# Patient Record
Sex: Female | Born: 1940 | Race: White | Hispanic: No | Marital: Married | Smoking: Never smoker
Health system: Southern US, Community
[De-identification: ages and names within clinical notes are randomized; demographics above are authoritative.]

## PROBLEM LIST (undated history)

## (undated) DIAGNOSIS — H409 Unspecified glaucoma: Secondary | ICD-10-CM

## (undated) DIAGNOSIS — M1990 Unspecified osteoarthritis, unspecified site: Secondary | ICD-10-CM

## (undated) DIAGNOSIS — G3184 Mild cognitive impairment, so stated: Secondary | ICD-10-CM

## (undated) DIAGNOSIS — I639 Cerebral infarction, unspecified: Secondary | ICD-10-CM

## (undated) DIAGNOSIS — K219 Gastro-esophageal reflux disease without esophagitis: Secondary | ICD-10-CM

## (undated) DIAGNOSIS — N390 Urinary tract infection, site not specified: Secondary | ICD-10-CM

## (undated) DIAGNOSIS — T8859XA Other complications of anesthesia, initial encounter: Secondary | ICD-10-CM

## (undated) DIAGNOSIS — I10 Essential (primary) hypertension: Secondary | ICD-10-CM

## (undated) DIAGNOSIS — E039 Hypothyroidism, unspecified: Secondary | ICD-10-CM

## (undated) DIAGNOSIS — D649 Anemia, unspecified: Secondary | ICD-10-CM

## (undated) DIAGNOSIS — F40240 Claustrophobia: Secondary | ICD-10-CM

## (undated) DIAGNOSIS — T4145XA Adverse effect of unspecified anesthetic, initial encounter: Secondary | ICD-10-CM

## (undated) HISTORY — PX: REFRACTIVE SURGERY: SHX103

## (undated) HISTORY — PX: HIP FRACTURE SURGERY: SHX118

## (undated) HISTORY — PX: RETINAL DETACHMENT SURGERY: SHX105

## (undated) HISTORY — PX: CATARACT EXTRACTION W/ INTRAOCULAR LENS  IMPLANT, BILATERAL: SHX1307

## (undated) HISTORY — PX: ABDOMINAL HYSTERECTOMY: SHX81

## (undated) HISTORY — PX: HIP ARTHROPLASTY: SHX981

## (undated) HISTORY — PX: HERNIA REPAIR: SHX51

## (undated) HISTORY — PX: TONSILLECTOMY: SUR1361

---

## 2001-04-07 ENCOUNTER — Ambulatory Visit (HOSPITAL_COMMUNITY): Admission: RE | Admit: 2001-04-07 | Discharge: 2001-04-07 | Payer: Self-pay | Admitting: Oncology

## 2010-11-12 DIAGNOSIS — K219 Gastro-esophageal reflux disease without esophagitis: Secondary | ICD-10-CM | POA: Diagnosis present

## 2010-11-12 DIAGNOSIS — M1990 Unspecified osteoarthritis, unspecified site: Secondary | ICD-10-CM | POA: Insufficient documentation

## 2010-11-12 DIAGNOSIS — E7800 Pure hypercholesterolemia, unspecified: Secondary | ICD-10-CM | POA: Diagnosis present

## 2011-02-21 DIAGNOSIS — R300 Dysuria: Secondary | ICD-10-CM | POA: Insufficient documentation

## 2011-02-21 DIAGNOSIS — R1084 Generalized abdominal pain: Secondary | ICD-10-CM | POA: Insufficient documentation

## 2011-05-17 DIAGNOSIS — M810 Age-related osteoporosis without current pathological fracture: Secondary | ICD-10-CM | POA: Insufficient documentation

## 2011-05-17 DIAGNOSIS — M5440 Lumbago with sciatica, unspecified side: Secondary | ICD-10-CM | POA: Insufficient documentation

## 2011-10-21 DIAGNOSIS — Z961 Presence of intraocular lens: Secondary | ICD-10-CM | POA: Diagnosis not present

## 2011-10-21 DIAGNOSIS — H40059 Ocular hypertension, unspecified eye: Secondary | ICD-10-CM | POA: Diagnosis not present

## 2011-10-21 DIAGNOSIS — H35039 Hypertensive retinopathy, unspecified eye: Secondary | ICD-10-CM | POA: Diagnosis not present

## 2011-10-21 DIAGNOSIS — H40019 Open angle with borderline findings, low risk, unspecified eye: Secondary | ICD-10-CM | POA: Diagnosis not present

## 2011-10-21 DIAGNOSIS — H43399 Other vitreous opacities, unspecified eye: Secondary | ICD-10-CM | POA: Diagnosis not present

## 2011-11-01 DIAGNOSIS — M25569 Pain in unspecified knee: Secondary | ICD-10-CM | POA: Diagnosis not present

## 2011-11-01 DIAGNOSIS — M25579 Pain in unspecified ankle and joints of unspecified foot: Secondary | ICD-10-CM | POA: Diagnosis not present

## 2011-11-04 DIAGNOSIS — M7989 Other specified soft tissue disorders: Secondary | ICD-10-CM | POA: Diagnosis not present

## 2011-11-04 DIAGNOSIS — M79609 Pain in unspecified limb: Secondary | ICD-10-CM | POA: Diagnosis not present

## 2011-11-18 DIAGNOSIS — E7800 Pure hypercholesterolemia, unspecified: Secondary | ICD-10-CM | POA: Diagnosis not present

## 2011-11-18 DIAGNOSIS — K2100 Gastro-esophageal reflux disease with esophagitis, without bleeding: Secondary | ICD-10-CM | POA: Diagnosis not present

## 2011-11-18 DIAGNOSIS — M5430 Sciatica, unspecified side: Secondary | ICD-10-CM | POA: Diagnosis not present

## 2011-11-18 DIAGNOSIS — M810 Age-related osteoporosis without current pathological fracture: Secondary | ICD-10-CM | POA: Diagnosis not present

## 2011-11-26 DIAGNOSIS — E7800 Pure hypercholesterolemia, unspecified: Secondary | ICD-10-CM | POA: Diagnosis not present

## 2011-11-26 DIAGNOSIS — K2100 Gastro-esophageal reflux disease with esophagitis, without bleeding: Secondary | ICD-10-CM | POA: Diagnosis not present

## 2011-11-26 DIAGNOSIS — R5381 Other malaise: Secondary | ICD-10-CM | POA: Diagnosis not present

## 2011-12-12 DIAGNOSIS — H33019 Retinal detachment with single break, unspecified eye: Secondary | ICD-10-CM | POA: Diagnosis not present

## 2012-02-20 DIAGNOSIS — H40019 Open angle with borderline findings, low risk, unspecified eye: Secondary | ICD-10-CM | POA: Diagnosis not present

## 2012-02-20 DIAGNOSIS — H43819 Vitreous degeneration, unspecified eye: Secondary | ICD-10-CM | POA: Diagnosis not present

## 2012-04-22 DIAGNOSIS — D1800 Hemangioma unspecified site: Secondary | ICD-10-CM | POA: Diagnosis not present

## 2012-04-22 DIAGNOSIS — L570 Actinic keratosis: Secondary | ICD-10-CM | POA: Diagnosis not present

## 2012-04-22 DIAGNOSIS — L819 Disorder of pigmentation, unspecified: Secondary | ICD-10-CM | POA: Diagnosis not present

## 2012-04-22 DIAGNOSIS — D485 Neoplasm of uncertain behavior of skin: Secondary | ICD-10-CM | POA: Diagnosis not present

## 2012-05-04 DIAGNOSIS — Z23 Encounter for immunization: Secondary | ICD-10-CM | POA: Diagnosis not present

## 2012-05-12 DIAGNOSIS — K2100 Gastro-esophageal reflux disease with esophagitis, without bleeding: Secondary | ICD-10-CM | POA: Diagnosis not present

## 2012-05-12 DIAGNOSIS — R5381 Other malaise: Secondary | ICD-10-CM | POA: Diagnosis not present

## 2012-05-12 DIAGNOSIS — E7800 Pure hypercholesterolemia, unspecified: Secondary | ICD-10-CM | POA: Diagnosis not present

## 2012-05-12 DIAGNOSIS — R7309 Other abnormal glucose: Secondary | ICD-10-CM | POA: Diagnosis not present

## 2012-05-18 DIAGNOSIS — E7800 Pure hypercholesterolemia, unspecified: Secondary | ICD-10-CM | POA: Diagnosis not present

## 2012-05-18 DIAGNOSIS — E039 Hypothyroidism, unspecified: Secondary | ICD-10-CM | POA: Diagnosis not present

## 2012-05-18 DIAGNOSIS — S9030XA Contusion of unspecified foot, initial encounter: Secondary | ICD-10-CM | POA: Diagnosis not present

## 2012-05-18 DIAGNOSIS — K2100 Gastro-esophageal reflux disease with esophagitis, without bleeding: Secondary | ICD-10-CM | POA: Diagnosis not present

## 2012-07-02 DIAGNOSIS — Z1231 Encounter for screening mammogram for malignant neoplasm of breast: Secondary | ICD-10-CM | POA: Diagnosis not present

## 2012-07-16 DIAGNOSIS — H40019 Open angle with borderline findings, low risk, unspecified eye: Secondary | ICD-10-CM | POA: Diagnosis not present

## 2012-07-16 DIAGNOSIS — H35039 Hypertensive retinopathy, unspecified eye: Secondary | ICD-10-CM | POA: Diagnosis not present

## 2012-07-16 DIAGNOSIS — H43399 Other vitreous opacities, unspecified eye: Secondary | ICD-10-CM | POA: Diagnosis not present

## 2012-07-16 DIAGNOSIS — Z961 Presence of intraocular lens: Secondary | ICD-10-CM | POA: Diagnosis not present

## 2012-11-12 DIAGNOSIS — K2100 Gastro-esophageal reflux disease with esophagitis, without bleeding: Secondary | ICD-10-CM | POA: Diagnosis not present

## 2012-11-12 DIAGNOSIS — E7800 Pure hypercholesterolemia, unspecified: Secondary | ICD-10-CM | POA: Diagnosis not present

## 2012-11-12 DIAGNOSIS — E039 Hypothyroidism, unspecified: Secondary | ICD-10-CM | POA: Diagnosis not present

## 2012-11-18 DIAGNOSIS — E039 Hypothyroidism, unspecified: Secondary | ICD-10-CM | POA: Diagnosis not present

## 2012-11-18 DIAGNOSIS — K2100 Gastro-esophageal reflux disease with esophagitis, without bleeding: Secondary | ICD-10-CM | POA: Diagnosis not present

## 2012-11-18 DIAGNOSIS — E7800 Pure hypercholesterolemia, unspecified: Secondary | ICD-10-CM | POA: Diagnosis not present

## 2012-11-18 DIAGNOSIS — N951 Menopausal and female climacteric states: Secondary | ICD-10-CM | POA: Insufficient documentation

## 2013-02-11 DIAGNOSIS — H40019 Open angle with borderline findings, low risk, unspecified eye: Secondary | ICD-10-CM | POA: Diagnosis not present

## 2013-04-30 DIAGNOSIS — J0100 Acute maxillary sinusitis, unspecified: Secondary | ICD-10-CM | POA: Insufficient documentation

## 2013-04-30 DIAGNOSIS — A491 Streptococcal infection, unspecified site: Secondary | ICD-10-CM | POA: Insufficient documentation

## 2013-05-10 DIAGNOSIS — R5381 Other malaise: Secondary | ICD-10-CM | POA: Insufficient documentation

## 2013-05-11 DIAGNOSIS — E7800 Pure hypercholesterolemia, unspecified: Secondary | ICD-10-CM | POA: Diagnosis not present

## 2013-05-11 DIAGNOSIS — K2100 Gastro-esophageal reflux disease with esophagitis, without bleeding: Secondary | ICD-10-CM | POA: Diagnosis not present

## 2013-05-11 DIAGNOSIS — Z23 Encounter for immunization: Secondary | ICD-10-CM | POA: Insufficient documentation

## 2013-05-11 DIAGNOSIS — R5381 Other malaise: Secondary | ICD-10-CM | POA: Diagnosis not present

## 2013-05-11 DIAGNOSIS — E039 Hypothyroidism, unspecified: Secondary | ICD-10-CM | POA: Diagnosis not present

## 2013-05-19 DIAGNOSIS — N951 Menopausal and female climacteric states: Secondary | ICD-10-CM | POA: Diagnosis not present

## 2013-05-19 DIAGNOSIS — R7309 Other abnormal glucose: Secondary | ICD-10-CM | POA: Diagnosis not present

## 2013-05-19 DIAGNOSIS — K2100 Gastro-esophageal reflux disease with esophagitis, without bleeding: Secondary | ICD-10-CM | POA: Diagnosis not present

## 2013-05-19 DIAGNOSIS — E7800 Pure hypercholesterolemia, unspecified: Secondary | ICD-10-CM | POA: Diagnosis not present

## 2013-05-19 DIAGNOSIS — E039 Hypothyroidism, unspecified: Secondary | ICD-10-CM | POA: Diagnosis not present

## 2013-07-05 DIAGNOSIS — Z1231 Encounter for screening mammogram for malignant neoplasm of breast: Secondary | ICD-10-CM | POA: Diagnosis not present

## 2013-08-05 DIAGNOSIS — H35039 Hypertensive retinopathy, unspecified eye: Secondary | ICD-10-CM | POA: Diagnosis not present

## 2013-08-05 DIAGNOSIS — H35379 Puckering of macula, unspecified eye: Secondary | ICD-10-CM | POA: Diagnosis not present

## 2013-08-05 DIAGNOSIS — H40019 Open angle with borderline findings, low risk, unspecified eye: Secondary | ICD-10-CM | POA: Diagnosis not present

## 2013-10-25 DIAGNOSIS — L819 Disorder of pigmentation, unspecified: Secondary | ICD-10-CM | POA: Diagnosis not present

## 2013-10-25 DIAGNOSIS — D1800 Hemangioma unspecified site: Secondary | ICD-10-CM | POA: Diagnosis not present

## 2013-11-10 DIAGNOSIS — R7309 Other abnormal glucose: Secondary | ICD-10-CM | POA: Diagnosis not present

## 2013-11-10 DIAGNOSIS — K2100 Gastro-esophageal reflux disease with esophagitis, without bleeding: Secondary | ICD-10-CM | POA: Diagnosis not present

## 2013-11-10 DIAGNOSIS — E039 Hypothyroidism, unspecified: Secondary | ICD-10-CM | POA: Diagnosis not present

## 2013-11-10 DIAGNOSIS — E782 Mixed hyperlipidemia: Secondary | ICD-10-CM | POA: Diagnosis not present

## 2013-11-10 DIAGNOSIS — E7800 Pure hypercholesterolemia, unspecified: Secondary | ICD-10-CM | POA: Diagnosis not present

## 2013-11-17 DIAGNOSIS — N951 Menopausal and female climacteric states: Secondary | ICD-10-CM | POA: Diagnosis not present

## 2013-11-17 DIAGNOSIS — E039 Hypothyroidism, unspecified: Secondary | ICD-10-CM | POA: Diagnosis not present

## 2013-11-17 DIAGNOSIS — R7309 Other abnormal glucose: Secondary | ICD-10-CM | POA: Diagnosis not present

## 2013-11-17 DIAGNOSIS — E7800 Pure hypercholesterolemia, unspecified: Secondary | ICD-10-CM | POA: Diagnosis not present

## 2013-11-17 DIAGNOSIS — K2100 Gastro-esophageal reflux disease with esophagitis, without bleeding: Secondary | ICD-10-CM | POA: Diagnosis not present

## 2013-12-13 DIAGNOSIS — R1011 Right upper quadrant pain: Secondary | ICD-10-CM | POA: Diagnosis not present

## 2013-12-17 DIAGNOSIS — R1011 Right upper quadrant pain: Secondary | ICD-10-CM | POA: Diagnosis not present

## 2014-03-22 DIAGNOSIS — H40029 Open angle with borderline findings, high risk, unspecified eye: Secondary | ICD-10-CM | POA: Diagnosis not present

## 2014-04-27 DIAGNOSIS — H401290 Low-tension glaucoma, unspecified eye, stage unspecified: Secondary | ICD-10-CM | POA: Diagnosis not present

## 2014-04-27 DIAGNOSIS — H409 Unspecified glaucoma: Secondary | ICD-10-CM | POA: Diagnosis not present

## 2014-04-27 DIAGNOSIS — H4720 Unspecified optic atrophy: Secondary | ICD-10-CM | POA: Diagnosis not present

## 2014-05-16 DIAGNOSIS — Z23 Encounter for immunization: Secondary | ICD-10-CM | POA: Diagnosis not present

## 2014-05-16 DIAGNOSIS — E780 Pure hypercholesterolemia: Secondary | ICD-10-CM | POA: Diagnosis not present

## 2014-05-16 DIAGNOSIS — K210 Gastro-esophageal reflux disease with esophagitis: Secondary | ICD-10-CM | POA: Diagnosis not present

## 2014-05-16 DIAGNOSIS — E039 Hypothyroidism, unspecified: Secondary | ICD-10-CM | POA: Diagnosis not present

## 2014-05-23 DIAGNOSIS — Z23 Encounter for immunization: Secondary | ICD-10-CM | POA: Diagnosis not present

## 2014-05-23 DIAGNOSIS — E782 Mixed hyperlipidemia: Secondary | ICD-10-CM | POA: Diagnosis not present

## 2014-05-23 DIAGNOSIS — K210 Gastro-esophageal reflux disease with esophagitis: Secondary | ICD-10-CM | POA: Diagnosis not present

## 2014-05-23 DIAGNOSIS — E039 Hypothyroidism, unspecified: Secondary | ICD-10-CM | POA: Diagnosis not present

## 2014-05-30 DIAGNOSIS — M25469 Effusion, unspecified knee: Secondary | ICD-10-CM | POA: Insufficient documentation

## 2014-05-31 DIAGNOSIS — M25461 Effusion, right knee: Secondary | ICD-10-CM | POA: Diagnosis not present

## 2014-05-31 DIAGNOSIS — M179 Osteoarthritis of knee, unspecified: Secondary | ICD-10-CM | POA: Diagnosis not present

## 2014-07-15 DIAGNOSIS — Z1231 Encounter for screening mammogram for malignant neoplasm of breast: Secondary | ICD-10-CM | POA: Diagnosis not present

## 2014-08-10 DIAGNOSIS — H47293 Other optic atrophy, bilateral: Secondary | ICD-10-CM | POA: Diagnosis not present

## 2014-08-10 DIAGNOSIS — H401233 Low-tension glaucoma, bilateral, severe stage: Secondary | ICD-10-CM | POA: Diagnosis not present

## 2014-08-10 DIAGNOSIS — H02825 Cysts of left lower eyelid: Secondary | ICD-10-CM | POA: Diagnosis not present

## 2014-10-18 DIAGNOSIS — D1800 Hemangioma unspecified site: Secondary | ICD-10-CM | POA: Diagnosis not present

## 2014-10-18 DIAGNOSIS — D485 Neoplasm of uncertain behavior of skin: Secondary | ICD-10-CM | POA: Diagnosis not present

## 2014-10-18 DIAGNOSIS — D225 Melanocytic nevi of trunk: Secondary | ICD-10-CM | POA: Diagnosis not present

## 2014-10-18 DIAGNOSIS — L570 Actinic keratosis: Secondary | ICD-10-CM | POA: Diagnosis not present

## 2014-10-25 DIAGNOSIS — E782 Mixed hyperlipidemia: Secondary | ICD-10-CM | POA: Diagnosis not present

## 2014-10-25 DIAGNOSIS — M179 Osteoarthritis of knee, unspecified: Secondary | ICD-10-CM | POA: Diagnosis not present

## 2014-10-25 DIAGNOSIS — R739 Hyperglycemia, unspecified: Secondary | ICD-10-CM | POA: Diagnosis not present

## 2014-10-25 DIAGNOSIS — E780 Pure hypercholesterolemia: Secondary | ICD-10-CM | POA: Diagnosis not present

## 2014-10-25 DIAGNOSIS — E039 Hypothyroidism, unspecified: Secondary | ICD-10-CM | POA: Diagnosis not present

## 2014-10-25 DIAGNOSIS — M1710 Unilateral primary osteoarthritis, unspecified knee: Secondary | ICD-10-CM | POA: Insufficient documentation

## 2014-10-25 DIAGNOSIS — K210 Gastro-esophageal reflux disease with esophagitis: Secondary | ICD-10-CM | POA: Diagnosis not present

## 2014-10-27 DIAGNOSIS — E780 Pure hypercholesterolemia: Secondary | ICD-10-CM | POA: Diagnosis not present

## 2014-10-27 DIAGNOSIS — E039 Hypothyroidism, unspecified: Secondary | ICD-10-CM | POA: Diagnosis not present

## 2014-10-27 DIAGNOSIS — E782 Mixed hyperlipidemia: Secondary | ICD-10-CM | POA: Diagnosis not present

## 2014-10-27 DIAGNOSIS — M179 Osteoarthritis of knee, unspecified: Secondary | ICD-10-CM | POA: Diagnosis not present

## 2014-10-27 DIAGNOSIS — K210 Gastro-esophageal reflux disease with esophagitis: Secondary | ICD-10-CM | POA: Diagnosis not present

## 2014-10-31 DIAGNOSIS — N951 Menopausal and female climacteric states: Secondary | ICD-10-CM | POA: Diagnosis not present

## 2014-10-31 DIAGNOSIS — E039 Hypothyroidism, unspecified: Secondary | ICD-10-CM | POA: Diagnosis not present

## 2014-10-31 DIAGNOSIS — E782 Mixed hyperlipidemia: Secondary | ICD-10-CM | POA: Diagnosis not present

## 2014-10-31 DIAGNOSIS — Z1389 Encounter for screening for other disorder: Secondary | ICD-10-CM | POA: Diagnosis not present

## 2014-10-31 DIAGNOSIS — K210 Gastro-esophageal reflux disease with esophagitis: Secondary | ICD-10-CM | POA: Diagnosis not present

## 2014-11-03 DIAGNOSIS — H43813 Vitreous degeneration, bilateral: Secondary | ICD-10-CM | POA: Diagnosis not present

## 2014-11-03 DIAGNOSIS — H35033 Hypertensive retinopathy, bilateral: Secondary | ICD-10-CM | POA: Diagnosis not present

## 2014-11-03 DIAGNOSIS — H401233 Low-tension glaucoma, bilateral, severe stage: Secondary | ICD-10-CM | POA: Diagnosis not present

## 2014-11-03 DIAGNOSIS — H35373 Puckering of macula, bilateral: Secondary | ICD-10-CM | POA: Diagnosis not present

## 2014-11-17 DIAGNOSIS — H401213 Low-tension glaucoma, right eye, severe stage: Secondary | ICD-10-CM | POA: Diagnosis not present

## 2014-12-01 DIAGNOSIS — H401223 Low-tension glaucoma, left eye, severe stage: Secondary | ICD-10-CM | POA: Diagnosis not present

## 2015-01-17 DIAGNOSIS — L720 Epidermal cyst: Secondary | ICD-10-CM | POA: Diagnosis not present

## 2015-03-27 DIAGNOSIS — H02825 Cysts of left lower eyelid: Secondary | ICD-10-CM | POA: Diagnosis not present

## 2015-03-27 DIAGNOSIS — H401233 Low-tension glaucoma, bilateral, severe stage: Secondary | ICD-10-CM | POA: Diagnosis not present

## 2015-03-27 DIAGNOSIS — H40053 Ocular hypertension, bilateral: Secondary | ICD-10-CM | POA: Diagnosis not present

## 2015-03-27 DIAGNOSIS — H47021 Hemorrhage in optic nerve sheath, right eye: Secondary | ICD-10-CM | POA: Diagnosis not present

## 2015-05-10 DIAGNOSIS — H401233 Low-tension glaucoma, bilateral, severe stage: Secondary | ICD-10-CM | POA: Diagnosis not present

## 2015-05-17 DIAGNOSIS — Z23 Encounter for immunization: Secondary | ICD-10-CM | POA: Insufficient documentation

## 2015-05-18 DIAGNOSIS — K210 Gastro-esophageal reflux disease with esophagitis: Secondary | ICD-10-CM | POA: Diagnosis not present

## 2015-05-18 DIAGNOSIS — R739 Hyperglycemia, unspecified: Secondary | ICD-10-CM | POA: Diagnosis not present

## 2015-05-18 DIAGNOSIS — E039 Hypothyroidism, unspecified: Secondary | ICD-10-CM | POA: Diagnosis not present

## 2015-05-18 DIAGNOSIS — Z13220 Encounter for screening for lipoid disorders: Secondary | ICD-10-CM | POA: Diagnosis not present

## 2015-05-18 DIAGNOSIS — Z23 Encounter for immunization: Secondary | ICD-10-CM | POA: Diagnosis not present

## 2015-05-18 DIAGNOSIS — E782 Mixed hyperlipidemia: Secondary | ICD-10-CM | POA: Diagnosis not present

## 2015-05-24 DIAGNOSIS — G3184 Mild cognitive impairment, so stated: Secondary | ICD-10-CM | POA: Insufficient documentation

## 2015-05-25 DIAGNOSIS — G3184 Mild cognitive impairment, so stated: Secondary | ICD-10-CM | POA: Diagnosis not present

## 2015-05-25 DIAGNOSIS — K210 Gastro-esophageal reflux disease with esophagitis: Secondary | ICD-10-CM | POA: Diagnosis not present

## 2015-05-25 DIAGNOSIS — E039 Hypothyroidism, unspecified: Secondary | ICD-10-CM | POA: Diagnosis not present

## 2015-05-25 DIAGNOSIS — N951 Menopausal and female climacteric states: Secondary | ICD-10-CM | POA: Diagnosis not present

## 2015-05-25 DIAGNOSIS — E782 Mixed hyperlipidemia: Secondary | ICD-10-CM | POA: Diagnosis not present

## 2015-07-18 DIAGNOSIS — Z1231 Encounter for screening mammogram for malignant neoplasm of breast: Secondary | ICD-10-CM | POA: Diagnosis not present

## 2015-07-25 DIAGNOSIS — H401233 Low-tension glaucoma, bilateral, severe stage: Secondary | ICD-10-CM | POA: Diagnosis not present

## 2015-08-28 DIAGNOSIS — J209 Acute bronchitis, unspecified: Secondary | ICD-10-CM | POA: Insufficient documentation

## 2015-08-29 DIAGNOSIS — J209 Acute bronchitis, unspecified: Secondary | ICD-10-CM | POA: Diagnosis not present

## 2015-09-12 ENCOUNTER — Encounter (INDEPENDENT_AMBULATORY_CARE_PROVIDER_SITE_OTHER): Payer: Self-pay | Admitting: *Deleted

## 2015-09-21 DIAGNOSIS — J0101 Acute recurrent maxillary sinusitis: Secondary | ICD-10-CM | POA: Diagnosis not present

## 2015-11-09 DIAGNOSIS — H47293 Other optic atrophy, bilateral: Secondary | ICD-10-CM | POA: Diagnosis not present

## 2015-11-09 DIAGNOSIS — H401213 Low-tension glaucoma, right eye, severe stage: Secondary | ICD-10-CM | POA: Diagnosis not present

## 2015-11-09 DIAGNOSIS — H43813 Vitreous degeneration, bilateral: Secondary | ICD-10-CM | POA: Diagnosis not present

## 2015-11-09 DIAGNOSIS — Z961 Presence of intraocular lens: Secondary | ICD-10-CM | POA: Diagnosis not present

## 2015-11-09 DIAGNOSIS — H35373 Puckering of macula, bilateral: Secondary | ICD-10-CM | POA: Diagnosis not present

## 2015-11-09 DIAGNOSIS — H35033 Hypertensive retinopathy, bilateral: Secondary | ICD-10-CM | POA: Diagnosis not present

## 2015-11-09 DIAGNOSIS — H401223 Low-tension glaucoma, left eye, severe stage: Secondary | ICD-10-CM | POA: Diagnosis not present

## 2015-11-16 DIAGNOSIS — E039 Hypothyroidism, unspecified: Secondary | ICD-10-CM | POA: Diagnosis not present

## 2015-11-16 DIAGNOSIS — R739 Hyperglycemia, unspecified: Secondary | ICD-10-CM | POA: Diagnosis not present

## 2015-11-16 DIAGNOSIS — E782 Mixed hyperlipidemia: Secondary | ICD-10-CM | POA: Diagnosis not present

## 2015-11-16 DIAGNOSIS — E7800 Pure hypercholesterolemia, unspecified: Secondary | ICD-10-CM | POA: Diagnosis not present

## 2015-11-16 DIAGNOSIS — K210 Gastro-esophageal reflux disease with esophagitis: Secondary | ICD-10-CM | POA: Diagnosis not present

## 2015-11-21 DIAGNOSIS — E782 Mixed hyperlipidemia: Secondary | ICD-10-CM | POA: Diagnosis not present

## 2015-11-21 DIAGNOSIS — E039 Hypothyroidism, unspecified: Secondary | ICD-10-CM | POA: Diagnosis not present

## 2015-11-21 DIAGNOSIS — R1110 Vomiting, unspecified: Secondary | ICD-10-CM | POA: Diagnosis not present

## 2015-11-21 DIAGNOSIS — K210 Gastro-esophageal reflux disease with esophagitis: Secondary | ICD-10-CM | POA: Diagnosis not present

## 2015-11-21 DIAGNOSIS — N951 Menopausal and female climacteric states: Secondary | ICD-10-CM | POA: Diagnosis not present

## 2015-11-21 DIAGNOSIS — G3184 Mild cognitive impairment, so stated: Secondary | ICD-10-CM | POA: Diagnosis not present

## 2015-11-21 DIAGNOSIS — T7840XA Allergy, unspecified, initial encounter: Secondary | ICD-10-CM | POA: Diagnosis not present

## 2016-02-27 DIAGNOSIS — H903 Sensorineural hearing loss, bilateral: Secondary | ICD-10-CM | POA: Diagnosis not present

## 2016-03-08 DIAGNOSIS — Z6823 Body mass index (BMI) 23.0-23.9, adult: Secondary | ICD-10-CM | POA: Diagnosis not present

## 2016-03-08 DIAGNOSIS — R300 Dysuria: Secondary | ICD-10-CM | POA: Diagnosis not present

## 2016-03-14 DIAGNOSIS — H401213 Low-tension glaucoma, right eye, severe stage: Secondary | ICD-10-CM | POA: Diagnosis not present

## 2016-03-14 DIAGNOSIS — H47293 Other optic atrophy, bilateral: Secondary | ICD-10-CM | POA: Diagnosis not present

## 2016-03-14 DIAGNOSIS — H47021 Hemorrhage in optic nerve sheath, right eye: Secondary | ICD-10-CM | POA: Diagnosis not present

## 2016-03-14 DIAGNOSIS — H401223 Low-tension glaucoma, left eye, severe stage: Secondary | ICD-10-CM | POA: Diagnosis not present

## 2016-03-25 ENCOUNTER — Other Ambulatory Visit: Payer: Self-pay

## 2016-04-17 DIAGNOSIS — H401131 Primary open-angle glaucoma, bilateral, mild stage: Secondary | ICD-10-CM | POA: Diagnosis not present

## 2016-04-18 DIAGNOSIS — Z23 Encounter for immunization: Secondary | ICD-10-CM | POA: Diagnosis not present

## 2016-04-22 DIAGNOSIS — Z1212 Encounter for screening for malignant neoplasm of rectum: Secondary | ICD-10-CM | POA: Diagnosis not present

## 2016-04-22 DIAGNOSIS — Z1211 Encounter for screening for malignant neoplasm of colon: Secondary | ICD-10-CM | POA: Diagnosis not present

## 2016-04-25 DIAGNOSIS — Z8669 Personal history of other diseases of the nervous system and sense organs: Secondary | ICD-10-CM | POA: Diagnosis not present

## 2016-04-25 DIAGNOSIS — H401322 Pigmentary glaucoma, left eye, moderate stage: Secondary | ICD-10-CM | POA: Diagnosis not present

## 2016-04-25 DIAGNOSIS — H401313 Pigmentary glaucoma, right eye, severe stage: Secondary | ICD-10-CM | POA: Diagnosis not present

## 2016-05-24 DIAGNOSIS — E039 Hypothyroidism, unspecified: Secondary | ICD-10-CM | POA: Diagnosis not present

## 2016-05-24 DIAGNOSIS — R739 Hyperglycemia, unspecified: Secondary | ICD-10-CM | POA: Diagnosis not present

## 2016-05-24 DIAGNOSIS — E782 Mixed hyperlipidemia: Secondary | ICD-10-CM | POA: Diagnosis not present

## 2016-05-24 DIAGNOSIS — K210 Gastro-esophageal reflux disease with esophagitis: Secondary | ICD-10-CM | POA: Diagnosis not present

## 2016-05-28 DIAGNOSIS — E782 Mixed hyperlipidemia: Secondary | ICD-10-CM | POA: Diagnosis not present

## 2016-05-28 DIAGNOSIS — I10 Essential (primary) hypertension: Secondary | ICD-10-CM | POA: Diagnosis not present

## 2016-05-28 DIAGNOSIS — Z6823 Body mass index (BMI) 23.0-23.9, adult: Secondary | ICD-10-CM | POA: Diagnosis not present

## 2016-05-28 DIAGNOSIS — G3184 Mild cognitive impairment, so stated: Secondary | ICD-10-CM | POA: Diagnosis not present

## 2016-05-28 DIAGNOSIS — K210 Gastro-esophageal reflux disease with esophagitis: Secondary | ICD-10-CM | POA: Diagnosis not present

## 2016-05-28 DIAGNOSIS — R42 Dizziness and giddiness: Secondary | ICD-10-CM | POA: Diagnosis not present

## 2016-05-28 DIAGNOSIS — N951 Menopausal and female climacteric states: Secondary | ICD-10-CM | POA: Diagnosis not present

## 2016-05-28 DIAGNOSIS — E039 Hypothyroidism, unspecified: Secondary | ICD-10-CM | POA: Diagnosis not present

## 2016-05-31 DIAGNOSIS — H401313 Pigmentary glaucoma, right eye, severe stage: Secondary | ICD-10-CM | POA: Diagnosis not present

## 2016-05-31 DIAGNOSIS — H401322 Pigmentary glaucoma, left eye, moderate stage: Secondary | ICD-10-CM | POA: Diagnosis not present

## 2016-06-05 DIAGNOSIS — N3001 Acute cystitis with hematuria: Secondary | ICD-10-CM | POA: Diagnosis not present

## 2016-06-05 DIAGNOSIS — Z6823 Body mass index (BMI) 23.0-23.9, adult: Secondary | ICD-10-CM | POA: Diagnosis not present

## 2016-06-05 DIAGNOSIS — R32 Unspecified urinary incontinence: Secondary | ICD-10-CM | POA: Diagnosis not present

## 2016-06-28 DIAGNOSIS — R32 Unspecified urinary incontinence: Secondary | ICD-10-CM | POA: Diagnosis not present

## 2016-06-28 DIAGNOSIS — Z6824 Body mass index (BMI) 24.0-24.9, adult: Secondary | ICD-10-CM | POA: Diagnosis not present

## 2016-06-28 DIAGNOSIS — N3001 Acute cystitis with hematuria: Secondary | ICD-10-CM | POA: Diagnosis not present

## 2016-07-18 DIAGNOSIS — Z1231 Encounter for screening mammogram for malignant neoplasm of breast: Secondary | ICD-10-CM | POA: Diagnosis not present

## 2016-08-05 DIAGNOSIS — H409 Unspecified glaucoma: Secondary | ICD-10-CM | POA: Diagnosis not present

## 2016-08-05 DIAGNOSIS — M25461 Effusion, right knee: Secondary | ICD-10-CM | POA: Diagnosis not present

## 2016-08-05 DIAGNOSIS — M1711 Unilateral primary osteoarthritis, right knee: Secondary | ICD-10-CM | POA: Diagnosis not present

## 2016-08-05 DIAGNOSIS — Z6824 Body mass index (BMI) 24.0-24.9, adult: Secondary | ICD-10-CM | POA: Diagnosis not present

## 2016-08-06 DIAGNOSIS — I10 Essential (primary) hypertension: Secondary | ICD-10-CM | POA: Diagnosis not present

## 2016-08-06 DIAGNOSIS — Z23 Encounter for immunization: Secondary | ICD-10-CM | POA: Diagnosis not present

## 2016-08-06 DIAGNOSIS — S199XXA Unspecified injury of neck, initial encounter: Secondary | ICD-10-CM | POA: Diagnosis not present

## 2016-08-06 DIAGNOSIS — W010XXA Fall on same level from slipping, tripping and stumbling without subsequent striking against object, initial encounter: Secondary | ICD-10-CM | POA: Diagnosis not present

## 2016-08-06 DIAGNOSIS — S0093XA Contusion of unspecified part of head, initial encounter: Secondary | ICD-10-CM | POA: Diagnosis not present

## 2016-08-06 DIAGNOSIS — S01512A Laceration without foreign body of oral cavity, initial encounter: Secondary | ICD-10-CM | POA: Diagnosis not present

## 2016-08-06 DIAGNOSIS — Z79899 Other long term (current) drug therapy: Secondary | ICD-10-CM | POA: Diagnosis not present

## 2016-08-06 DIAGNOSIS — Z7982 Long term (current) use of aspirin: Secondary | ICD-10-CM | POA: Diagnosis not present

## 2016-08-06 DIAGNOSIS — S022XXA Fracture of nasal bones, initial encounter for closed fracture: Secondary | ICD-10-CM | POA: Diagnosis not present

## 2016-08-29 DIAGNOSIS — G43109 Migraine with aura, not intractable, without status migrainosus: Secondary | ICD-10-CM | POA: Diagnosis not present

## 2016-08-29 DIAGNOSIS — H401313 Pigmentary glaucoma, right eye, severe stage: Secondary | ICD-10-CM | POA: Diagnosis not present

## 2016-09-10 DIAGNOSIS — G43109 Migraine with aura, not intractable, without status migrainosus: Secondary | ICD-10-CM | POA: Diagnosis not present

## 2016-09-10 DIAGNOSIS — G43909 Migraine, unspecified, not intractable, without status migrainosus: Secondary | ICD-10-CM | POA: Diagnosis not present

## 2016-09-10 DIAGNOSIS — G43119 Migraine with aura, intractable, without status migrainosus: Secondary | ICD-10-CM | POA: Diagnosis not present

## 2016-10-15 DIAGNOSIS — L570 Actinic keratosis: Secondary | ICD-10-CM | POA: Diagnosis not present

## 2016-10-15 DIAGNOSIS — D1800 Hemangioma unspecified site: Secondary | ICD-10-CM | POA: Diagnosis not present

## 2016-10-15 DIAGNOSIS — L821 Other seborrheic keratosis: Secondary | ICD-10-CM | POA: Diagnosis not present

## 2016-10-17 DIAGNOSIS — H401313 Pigmentary glaucoma, right eye, severe stage: Secondary | ICD-10-CM | POA: Diagnosis not present

## 2016-10-17 DIAGNOSIS — H401322 Pigmentary glaucoma, left eye, moderate stage: Secondary | ICD-10-CM | POA: Diagnosis not present

## 2016-10-30 DIAGNOSIS — M25461 Effusion, right knee: Secondary | ICD-10-CM | POA: Diagnosis not present

## 2016-10-30 DIAGNOSIS — Z6824 Body mass index (BMI) 24.0-24.9, adult: Secondary | ICD-10-CM | POA: Diagnosis not present

## 2016-10-30 DIAGNOSIS — M1711 Unilateral primary osteoarthritis, right knee: Secondary | ICD-10-CM | POA: Diagnosis not present

## 2016-11-19 DIAGNOSIS — Z23 Encounter for immunization: Secondary | ICD-10-CM | POA: Diagnosis not present

## 2016-11-22 DIAGNOSIS — E039 Hypothyroidism, unspecified: Secondary | ICD-10-CM | POA: Diagnosis not present

## 2016-11-22 DIAGNOSIS — E782 Mixed hyperlipidemia: Secondary | ICD-10-CM | POA: Diagnosis not present

## 2016-11-22 DIAGNOSIS — I10 Essential (primary) hypertension: Secondary | ICD-10-CM | POA: Diagnosis not present

## 2016-11-22 DIAGNOSIS — M80012A Age-related osteoporosis with current pathological fracture, left shoulder, initial encounter for fracture: Secondary | ICD-10-CM | POA: Diagnosis not present

## 2016-11-27 DIAGNOSIS — Z0001 Encounter for general adult medical examination with abnormal findings: Secondary | ICD-10-CM | POA: Diagnosis not present

## 2016-11-27 DIAGNOSIS — E782 Mixed hyperlipidemia: Secondary | ICD-10-CM | POA: Diagnosis not present

## 2016-11-27 DIAGNOSIS — G3184 Mild cognitive impairment, so stated: Secondary | ICD-10-CM | POA: Diagnosis not present

## 2016-11-27 DIAGNOSIS — R42 Dizziness and giddiness: Secondary | ICD-10-CM | POA: Diagnosis not present

## 2016-11-27 DIAGNOSIS — E039 Hypothyroidism, unspecified: Secondary | ICD-10-CM | POA: Diagnosis not present

## 2016-11-27 DIAGNOSIS — K210 Gastro-esophageal reflux disease with esophagitis: Secondary | ICD-10-CM | POA: Diagnosis not present

## 2016-11-27 DIAGNOSIS — I10 Essential (primary) hypertension: Secondary | ICD-10-CM | POA: Diagnosis not present

## 2016-11-27 DIAGNOSIS — N951 Menopausal and female climacteric states: Secondary | ICD-10-CM | POA: Diagnosis not present

## 2016-11-27 DIAGNOSIS — Z6825 Body mass index (BMI) 25.0-25.9, adult: Secondary | ICD-10-CM | POA: Diagnosis not present

## 2016-12-04 DIAGNOSIS — M25461 Effusion, right knee: Secondary | ICD-10-CM | POA: Diagnosis not present

## 2016-12-04 DIAGNOSIS — M1711 Unilateral primary osteoarthritis, right knee: Secondary | ICD-10-CM | POA: Diagnosis not present

## 2016-12-12 DIAGNOSIS — M1711 Unilateral primary osteoarthritis, right knee: Secondary | ICD-10-CM | POA: Diagnosis not present

## 2016-12-18 DIAGNOSIS — M25461 Effusion, right knee: Secondary | ICD-10-CM | POA: Diagnosis not present

## 2016-12-18 DIAGNOSIS — M1711 Unilateral primary osteoarthritis, right knee: Secondary | ICD-10-CM | POA: Diagnosis not present

## 2016-12-18 DIAGNOSIS — Z6824 Body mass index (BMI) 24.0-24.9, adult: Secondary | ICD-10-CM | POA: Diagnosis not present

## 2017-01-22 DIAGNOSIS — Z6825 Body mass index (BMI) 25.0-25.9, adult: Secondary | ICD-10-CM | POA: Diagnosis not present

## 2017-01-22 DIAGNOSIS — M19041 Primary osteoarthritis, right hand: Secondary | ICD-10-CM | POA: Diagnosis not present

## 2017-01-22 DIAGNOSIS — M1811 Unilateral primary osteoarthritis of first carpometacarpal joint, right hand: Secondary | ICD-10-CM | POA: Diagnosis not present

## 2017-01-22 DIAGNOSIS — M1711 Unilateral primary osteoarthritis, right knee: Secondary | ICD-10-CM | POA: Diagnosis not present

## 2017-01-22 DIAGNOSIS — M1812 Unilateral primary osteoarthritis of first carpometacarpal joint, left hand: Secondary | ICD-10-CM | POA: Diagnosis not present

## 2017-01-22 DIAGNOSIS — M19042 Primary osteoarthritis, left hand: Secondary | ICD-10-CM | POA: Diagnosis not present

## 2017-01-23 DIAGNOSIS — H401322 Pigmentary glaucoma, left eye, moderate stage: Secondary | ICD-10-CM | POA: Diagnosis not present

## 2017-01-23 DIAGNOSIS — H401313 Pigmentary glaucoma, right eye, severe stage: Secondary | ICD-10-CM | POA: Diagnosis not present

## 2017-01-30 DIAGNOSIS — N3001 Acute cystitis with hematuria: Secondary | ICD-10-CM | POA: Diagnosis not present

## 2017-01-30 DIAGNOSIS — Z6825 Body mass index (BMI) 25.0-25.9, adult: Secondary | ICD-10-CM | POA: Diagnosis not present

## 2017-02-08 DIAGNOSIS — Z6825 Body mass index (BMI) 25.0-25.9, adult: Secondary | ICD-10-CM | POA: Diagnosis not present

## 2017-02-08 DIAGNOSIS — N3001 Acute cystitis with hematuria: Secondary | ICD-10-CM | POA: Diagnosis not present

## 2017-02-24 DIAGNOSIS — R197 Diarrhea, unspecified: Secondary | ICD-10-CM | POA: Diagnosis not present

## 2017-02-24 DIAGNOSIS — Z6825 Body mass index (BMI) 25.0-25.9, adult: Secondary | ICD-10-CM | POA: Diagnosis not present

## 2017-03-18 DIAGNOSIS — Z23 Encounter for immunization: Secondary | ICD-10-CM | POA: Diagnosis not present

## 2017-04-08 DIAGNOSIS — H401122 Primary open-angle glaucoma, left eye, moderate stage: Secondary | ICD-10-CM | POA: Diagnosis not present

## 2017-04-08 DIAGNOSIS — H401113 Primary open-angle glaucoma, right eye, severe stage: Secondary | ICD-10-CM | POA: Diagnosis not present

## 2017-04-18 DIAGNOSIS — N3000 Acute cystitis without hematuria: Secondary | ICD-10-CM | POA: Diagnosis not present

## 2017-04-23 ENCOUNTER — Ambulatory Visit (INDEPENDENT_AMBULATORY_CARE_PROVIDER_SITE_OTHER): Payer: Medicare Other | Admitting: Urology

## 2017-04-23 DIAGNOSIS — N3021 Other chronic cystitis with hematuria: Secondary | ICD-10-CM | POA: Diagnosis not present

## 2017-05-12 DIAGNOSIS — H903 Sensorineural hearing loss, bilateral: Secondary | ICD-10-CM | POA: Diagnosis not present

## 2017-05-16 DIAGNOSIS — Z8669 Personal history of other diseases of the nervous system and sense organs: Secondary | ICD-10-CM | POA: Insufficient documentation

## 2017-05-16 DIAGNOSIS — H401322 Pigmentary glaucoma, left eye, moderate stage: Secondary | ICD-10-CM | POA: Diagnosis not present

## 2017-05-16 DIAGNOSIS — G43109 Migraine with aura, not intractable, without status migrainosus: Secondary | ICD-10-CM | POA: Diagnosis not present

## 2017-05-16 DIAGNOSIS — H401313 Pigmentary glaucoma, right eye, severe stage: Secondary | ICD-10-CM | POA: Insufficient documentation

## 2017-05-23 DIAGNOSIS — M25561 Pain in right knee: Secondary | ICD-10-CM | POA: Diagnosis not present

## 2017-05-23 DIAGNOSIS — M25562 Pain in left knee: Secondary | ICD-10-CM | POA: Diagnosis not present

## 2017-05-29 DIAGNOSIS — M948X6 Other specified disorders of cartilage, lower leg: Secondary | ICD-10-CM | POA: Diagnosis not present

## 2017-05-29 DIAGNOSIS — S83241A Other tear of medial meniscus, current injury, right knee, initial encounter: Secondary | ICD-10-CM | POA: Diagnosis not present

## 2017-05-29 DIAGNOSIS — S83231A Complex tear of medial meniscus, current injury, right knee, initial encounter: Secondary | ICD-10-CM | POA: Diagnosis not present

## 2017-05-29 DIAGNOSIS — S83281A Other tear of lateral meniscus, current injury, right knee, initial encounter: Secondary | ICD-10-CM | POA: Diagnosis not present

## 2017-05-30 DIAGNOSIS — K210 Gastro-esophageal reflux disease with esophagitis: Secondary | ICD-10-CM | POA: Diagnosis not present

## 2017-05-30 DIAGNOSIS — I10 Essential (primary) hypertension: Secondary | ICD-10-CM | POA: Diagnosis not present

## 2017-05-30 DIAGNOSIS — E782 Mixed hyperlipidemia: Secondary | ICD-10-CM | POA: Diagnosis not present

## 2017-05-30 DIAGNOSIS — R739 Hyperglycemia, unspecified: Secondary | ICD-10-CM | POA: Diagnosis not present

## 2017-05-30 DIAGNOSIS — M25561 Pain in right knee: Secondary | ICD-10-CM | POA: Diagnosis not present

## 2017-05-30 DIAGNOSIS — E039 Hypothyroidism, unspecified: Secondary | ICD-10-CM | POA: Diagnosis not present

## 2017-05-30 DIAGNOSIS — E7800 Pure hypercholesterolemia, unspecified: Secondary | ICD-10-CM | POA: Diagnosis not present

## 2017-05-30 DIAGNOSIS — G3184 Mild cognitive impairment, so stated: Secondary | ICD-10-CM | POA: Diagnosis not present

## 2017-06-04 DIAGNOSIS — E782 Mixed hyperlipidemia: Secondary | ICD-10-CM | POA: Diagnosis not present

## 2017-06-04 DIAGNOSIS — Z23 Encounter for immunization: Secondary | ICD-10-CM | POA: Diagnosis not present

## 2017-06-04 DIAGNOSIS — R7301 Impaired fasting glucose: Secondary | ICD-10-CM | POA: Diagnosis not present

## 2017-06-04 DIAGNOSIS — E039 Hypothyroidism, unspecified: Secondary | ICD-10-CM | POA: Diagnosis not present

## 2017-06-04 DIAGNOSIS — K210 Gastro-esophageal reflux disease with esophagitis: Secondary | ICD-10-CM | POA: Diagnosis not present

## 2017-06-04 DIAGNOSIS — N951 Menopausal and female climacteric states: Secondary | ICD-10-CM | POA: Diagnosis not present

## 2017-06-04 DIAGNOSIS — I10 Essential (primary) hypertension: Secondary | ICD-10-CM | POA: Diagnosis not present

## 2017-06-04 DIAGNOSIS — G3184 Mild cognitive impairment, so stated: Secondary | ICD-10-CM | POA: Diagnosis not present

## 2017-06-11 ENCOUNTER — Ambulatory Visit (INDEPENDENT_AMBULATORY_CARE_PROVIDER_SITE_OTHER): Payer: Medicare Other | Admitting: Urology

## 2017-06-11 DIAGNOSIS — N3021 Other chronic cystitis with hematuria: Secondary | ICD-10-CM

## 2017-07-08 DIAGNOSIS — H9201 Otalgia, right ear: Secondary | ICD-10-CM | POA: Insufficient documentation

## 2017-07-08 DIAGNOSIS — H903 Sensorineural hearing loss, bilateral: Secondary | ICD-10-CM | POA: Diagnosis not present

## 2017-07-24 DIAGNOSIS — Z1231 Encounter for screening mammogram for malignant neoplasm of breast: Secondary | ICD-10-CM | POA: Diagnosis not present

## 2017-08-29 DIAGNOSIS — M25561 Pain in right knee: Secondary | ICD-10-CM | POA: Diagnosis not present

## 2017-08-29 DIAGNOSIS — M25562 Pain in left knee: Secondary | ICD-10-CM | POA: Diagnosis not present

## 2017-09-10 ENCOUNTER — Other Ambulatory Visit (HOSPITAL_COMMUNITY)
Admission: AD | Admit: 2017-09-10 | Discharge: 2017-09-10 | Disposition: A | Discharge status: Home / Self Care | Payer: Medicare Other | Source: Other Acute Inpatient Hospital | Attending: Urology | Admitting: Urology

## 2017-09-10 ENCOUNTER — Ambulatory Visit (INDEPENDENT_AMBULATORY_CARE_PROVIDER_SITE_OTHER): Payer: Medicare Other | Admitting: Urology

## 2017-09-10 DIAGNOSIS — R300 Dysuria: Secondary | ICD-10-CM | POA: Diagnosis not present

## 2017-09-10 DIAGNOSIS — N3021 Other chronic cystitis with hematuria: Secondary | ICD-10-CM

## 2017-09-11 LAB — URINE CULTURE

## 2017-09-15 DIAGNOSIS — E039 Hypothyroidism, unspecified: Secondary | ICD-10-CM | POA: Diagnosis not present

## 2017-09-15 DIAGNOSIS — M179 Osteoarthritis of knee, unspecified: Secondary | ICD-10-CM | POA: Diagnosis not present

## 2017-09-15 DIAGNOSIS — Z6825 Body mass index (BMI) 25.0-25.9, adult: Secondary | ICD-10-CM | POA: Diagnosis not present

## 2017-09-15 DIAGNOSIS — I10 Essential (primary) hypertension: Secondary | ICD-10-CM | POA: Diagnosis not present

## 2017-09-15 DIAGNOSIS — E782 Mixed hyperlipidemia: Secondary | ICD-10-CM | POA: Diagnosis not present

## 2017-09-16 DIAGNOSIS — H401133 Primary open-angle glaucoma, bilateral, severe stage: Secondary | ICD-10-CM | POA: Diagnosis not present

## 2017-09-16 DIAGNOSIS — H401190 Primary open-angle glaucoma, unspecified eye, stage unspecified: Secondary | ICD-10-CM | POA: Insufficient documentation

## 2017-09-16 DIAGNOSIS — Z961 Presence of intraocular lens: Secondary | ICD-10-CM | POA: Diagnosis not present

## 2017-09-17 DIAGNOSIS — L0100 Impetigo, unspecified: Secondary | ICD-10-CM | POA: Diagnosis not present

## 2017-09-23 DIAGNOSIS — M25562 Pain in left knee: Secondary | ICD-10-CM | POA: Diagnosis not present

## 2017-09-23 DIAGNOSIS — M25561 Pain in right knee: Secondary | ICD-10-CM | POA: Diagnosis not present

## 2017-09-24 ENCOUNTER — Ambulatory Visit: Payer: Self-pay | Admitting: Physician Assistant

## 2017-09-24 NOTE — H&P (Signed)
TOTAL KNEE ADMISSION H&P  Patient is being admitted for right total knee arthroplasty.  Subjective:  Chief Complaint:right knee pain.  HPI: Rebekah King, 77 y.o. female, has a history of pain and functional disability in the right knee due to arthritis and has failed non-surgical conservative treatments for greater than 12 weeks to includeNSAID's and/or analgesics, corticosteriod injections and activity modification.  Onset of symptoms was gradual, starting 7 years ago with gradually worsening course since that time. The patient noted no past surgery on the right knee(s).  Patient currently rates pain in the right knee(s) at 8 out of 10 with activity. Patient has night pain, worsening of pain with activity and weight bearing, pain that interferes with activities of daily living, pain with passive range of motion, crepitus and joint swelling.  Patient has evidence of periarticular osteophytes and joint space narrowing by imaging studies. There is no active infection.  There are no active problems to display for this patient.  No past medical history on file.  PMH:  Right knee OA High cholesterol GERD   No current outpatient medications on file.   No current facility-administered medications for this visit.    Allergies not on file  Social History   Tobacco Use  . Smoking status: Not on file  Substance Use Topics  . Alcohol use: Not on file    No family history on file.   Review of Systems  HENT: Positive for hearing loss and tinnitus.   Gastrointestinal: Positive for diarrhea.  Genitourinary: Positive for dysuria, frequency, hematuria and urgency.  Musculoskeletal: Positive for joint pain.    Objective:  Physical Exam  Constitutional: She is oriented to person, place, and time. She appears well-developed and well-nourished. No distress.  HENT:  Head: Normocephalic and atraumatic.  Nose: Nose normal.  Eyes: Conjunctivae and EOM are normal. Pupils are equal, round, and  reactive to light.  Neck: Normal range of motion. Neck supple.  Cardiovascular: Normal rate, regular rhythm, normal heart sounds and intact distal pulses.  Respiratory: Effort normal and breath sounds normal. No respiratory distress. She has no wheezes.  GI: Soft. Bowel sounds are normal. She exhibits no distension. There is no tenderness.  Musculoskeletal:       Right knee: She exhibits decreased range of motion, swelling and deformity. Tenderness found.  Lymphadenopathy:    She has no cervical adenopathy.  Neurological: She is alert and oriented to person, place, and time. No cranial nerve deficit.  Skin: Skin is warm and dry. No rash noted. No erythema.  Psychiatric: She has a normal mood and affect. Her behavior is normal.    Vital signs in last 24 hours: @VSRANGES @  Labs:   There is no height or weight on file to calculate BMI.   Imaging Review Plain radiographs demonstrate severe degenerative joint disease of the right knee(s). The overall alignment ismild valgus. The bone quality appears to be good for age and reported activity level.  Assessment/Plan:  End stage arthritis, right knee   The patient history, physical examination, clinical judgment of the provider and imaging studies are consistent with end stage degenerative joint disease of the right knee(s) and total knee arthroplasty is deemed medically necessary. The treatment options including medical management, injection therapy arthroscopy and arthroplasty were discussed at length. The risks and benefits of total knee arthroplasty were presented and reviewed. The risks due to aseptic loosening, infection, stiffness, patella tracking problems, thromboembolic complications and other imponderables were discussed. The patient acknowledged the explanation, agreed  to proceed with the plan and consent was signed. Patient is being admitted for inpatient treatment for surgery, pain control, PT, OT, prophylactic antibiotics, VTE  prophylaxis, progressive ambulation and ADL's and discharge planning. The patient is planning to be discharged home with home health services

## 2017-09-24 NOTE — H&P (View-Only) (Signed)
TOTAL KNEE ADMISSION H&P  Patient is being admitted for right total knee arthroplasty.  Subjective:  Chief Complaint:right knee pain.  HPI: Rebekah King, 77 y.o. female, has a history of pain and functional disability in the right knee due to arthritis and has failed non-surgical conservative treatments for greater than 12 weeks to includeNSAID's and/or analgesics, corticosteriod injections and activity modification.  Onset of symptoms was gradual, starting 7 years ago with gradually worsening course since that time. The patient noted no past surgery on the right knee(s).  Patient currently rates pain in the right knee(s) at 8 out of 10 with activity. Patient has night pain, worsening of pain with activity and weight bearing, pain that interferes with activities of daily living, pain with passive range of motion, crepitus and joint swelling.  Patient has evidence of periarticular osteophytes and joint space narrowing by imaging studies. There is no active infection.  There are no active problems to display for this patient.  No past medical history on file.  PMH:  Right knee OA High cholesterol GERD   No current outpatient medications on file.   No current facility-administered medications for this visit.    Allergies not on file  Social History   Tobacco Use  . Smoking status: Not on file  Substance Use Topics  . Alcohol use: Not on file    No family history on file.   Review of Systems  HENT: Positive for hearing loss and tinnitus.   Gastrointestinal: Positive for diarrhea.  Genitourinary: Positive for dysuria, frequency, hematuria and urgency.  Musculoskeletal: Positive for joint pain.    Objective:  Physical Exam  Constitutional: She is oriented to person, place, and time. She appears well-developed and well-nourished. No distress.  HENT:  Head: Normocephalic and atraumatic.  Nose: Nose normal.  Eyes: Conjunctivae and EOM are normal. Pupils are equal, round, and  reactive to light.  Neck: Normal range of motion. Neck supple.  Cardiovascular: Normal rate, regular rhythm, normal heart sounds and intact distal pulses.  Respiratory: Effort normal and breath sounds normal. No respiratory distress. She has no wheezes.  GI: Soft. Bowel sounds are normal. She exhibits no distension. There is no tenderness.  Musculoskeletal:       Right knee: She exhibits decreased range of motion, swelling and deformity. Tenderness found.  Lymphadenopathy:    She has no cervical adenopathy.  Neurological: She is alert and oriented to person, place, and time. No cranial nerve deficit.  Skin: Skin is warm and dry. No rash noted. No erythema.  Psychiatric: She has a normal mood and affect. Her behavior is normal.    Vital signs in last 24 hours: @VSRANGES @  Labs:   There is no height or weight on file to calculate BMI.   Imaging Review Plain radiographs demonstrate severe degenerative joint disease of the right knee(s). The overall alignment ismild valgus. The bone quality appears to be good for age and reported activity level.  Assessment/Plan:  End stage arthritis, right knee   The patient history, physical examination, clinical judgment of the provider and imaging studies are consistent with end stage degenerative joint disease of the right knee(s) and total knee arthroplasty is deemed medically necessary. The treatment options including medical management, injection therapy arthroscopy and arthroplasty were discussed at length. The risks and benefits of total knee arthroplasty were presented and reviewed. The risks due to aseptic loosening, infection, stiffness, patella tracking problems, thromboembolic complications and other imponderables were discussed. The patient acknowledged the explanation, agreed  to proceed with the plan and consent was signed. Patient is being admitted for inpatient treatment for surgery, pain control, PT, OT, prophylactic antibiotics, VTE  prophylaxis, progressive ambulation and ADL's and discharge planning. The patient is planning to be discharged home with home health services

## 2017-10-06 NOTE — Pre-Procedure Instructions (Signed)
Rebekah King  10/06/2017      [facility] - [address], [place], [place] - [place] [address] Phone: [phone] Fax: [phone]    Your procedure is scheduled on  Friday 10/17/17  Report to [place] at 530 A.M.  Call this number if you have problems the morning of surgery:  [phone]   Remember:  Do not eat food or drink liquids after midnight.  Take these medicines the morning of surgery with A SIP OF WATER  - EYE DROPS, ESTRADIOL (ESTRACE), LEVOTHYROXINE, RANITIDINE (ZANTAC)  7 days prior to surgery STOP taking any Aspirin(unless otherwise instructed by your surgeon), Aleve, Naproxen, Ibuprofen, Motrin, Advil, Goody's, BC's, all herbal medications, fish oil, and all vitamins   Do not wear jewelry, make-up or nail polish.  Do not wear lotions, powders, or perfumes, or deodorant.  Do not shave 48 hours prior to surgery.  Men may shave face and neck.  Do not bring valuables to the hospital.  [facility] is not responsible for any belongings or valuables.  Contacts, dentures or bridgework may not be worn into surgery.  Leave your suitcase in the car.  After surgery it may be brought to your room.  For patients admitted to the hospital, discharge time will be determined by your treatment team.  Patients discharged the day of surgery will not be allowed to drive home.   Name and phone number of your driver:    Special instructions:  [place] - Preparing for Surgery  Before surgery, you can play an important role.  Because skin is not sterile, your skin needs to be as free of germs as possible.  You can reduce the number of germs on you skin by washing with CHG (chlorahexidine gluconate) soap before surgery.  CHG is an antiseptic cleaner which kills germs and bonds with the skin to continue killing germs even after washing.  Please DO NOT use if you have an allergy to CHG or antibacterial soaps.  If your skin  becomes reddened/irritated stop using the CHG and inform your nurse when you arrive at Short Stay.  Do not shave (including legs and underarms) for at least 48 hours prior to the first CHG shower.  You may shave your face.  Please follow these instructions carefully:   1.  Shower with CHG Soap the night before surgery and the                                morning of Surgery.  2.  If you choose to wash your hair, wash your hair first as usual with your       normal shampoo.  3.  After you shampoo, rinse your hair and body thoroughly to remove the                      Shampoo.  4.  Use CHG as you would any other liquid soap.  You can apply chg directly       to the skin and wash gently with scrungie or a clean washcloth.  5.  Apply the CHG Soap to your body ONLY FROM THE NECK DOWN.        Do not use on open wounds or open sores.  Avoid contact with your eyes,       ears, mouth and genitals (private parts).  Wash genitals (private parts)       with your normal soap.  6.  Wash thoroughly, paying special attention to the area where your surgery        will be performed.  7.  Thoroughly rinse your body with warm water from the neck down.  8.  DO NOT shower/wash with your normal soap after using and rinsing off       the CHG Soap.  9.  Pat yourself dry with a clean towel.            10.  Wear clean pajamas.            11.  Place clean sheets on your bed the night of your first shower and do not        sleep with pets.  Day of Surgery  Do not apply any lotions/deoderants the morning of surgery.  Please wear clean clothes to the hospital/surgery center.    Please read over the following fact sheets that you were given. Pain Booklet, MRSA Information and Surgical Site Infection Prevention

## 2017-10-07 ENCOUNTER — Encounter (HOSPITAL_COMMUNITY)
Admission: RE | Admit: 2017-10-07 | Discharge: 2017-10-07 | Disposition: A | Discharge status: Home / Self Care | Payer: Medicare Other | Source: Ambulatory Visit | Attending: Orthopedic Surgery | Admitting: Orthopedic Surgery

## 2017-10-07 ENCOUNTER — Other Ambulatory Visit: Payer: Self-pay

## 2017-10-07 ENCOUNTER — Encounter (HOSPITAL_COMMUNITY): Payer: Self-pay | Admitting: *Deleted

## 2017-10-07 ENCOUNTER — Ambulatory Visit (HOSPITAL_COMMUNITY)
Admission: RE | Admit: 2017-10-07 | Discharge: 2017-10-07 | Disposition: A | Discharge status: Home / Self Care | Payer: Medicare Other | Source: Ambulatory Visit | Attending: Physician Assistant | Admitting: Physician Assistant

## 2017-10-07 DIAGNOSIS — R9431 Abnormal electrocardiogram [ECG] [EKG]: Secondary | ICD-10-CM | POA: Insufficient documentation

## 2017-10-07 DIAGNOSIS — M25561 Pain in right knee: Secondary | ICD-10-CM | POA: Diagnosis not present

## 2017-10-07 DIAGNOSIS — M1711 Unilateral primary osteoarthritis, right knee: Secondary | ICD-10-CM | POA: Insufficient documentation

## 2017-10-07 DIAGNOSIS — Z01812 Encounter for preprocedural laboratory examination: Secondary | ICD-10-CM | POA: Insufficient documentation

## 2017-10-07 DIAGNOSIS — Z01818 Encounter for other preprocedural examination: Secondary | ICD-10-CM | POA: Insufficient documentation

## 2017-10-07 DIAGNOSIS — Z01810 Encounter for preprocedural cardiovascular examination: Secondary | ICD-10-CM | POA: Diagnosis not present

## 2017-10-07 HISTORY — DX: Hypothyroidism, unspecified: E03.9

## 2017-10-07 HISTORY — DX: Other complications of anesthesia, initial encounter: T88.59XA

## 2017-10-07 HISTORY — DX: Adverse effect of unspecified anesthetic, initial encounter: T41.45XA

## 2017-10-07 HISTORY — DX: Gastro-esophageal reflux disease without esophagitis: K21.9

## 2017-10-07 HISTORY — DX: Essential (primary) hypertension: I10

## 2017-10-07 HISTORY — DX: Urinary tract infection, site not specified: N39.0

## 2017-10-07 HISTORY — DX: Anemia, unspecified: D64.9

## 2017-10-07 HISTORY — DX: Unspecified glaucoma: H40.9

## 2017-10-07 HISTORY — DX: Unspecified osteoarthritis, unspecified site: M19.90

## 2017-10-07 LAB — COMPREHENSIVE METABOLIC PANEL
ALK PHOS: 62 U/L (ref 38–126)
ALT: 22 U/L (ref 14–54)
AST: 27 U/L (ref 15–41)
Albumin: 4.2 g/dL (ref 3.5–5.0)
Anion gap: 9 (ref 5–15)
BUN: 12 mg/dL (ref 6–20)
CALCIUM: 9.1 mg/dL (ref 8.9–10.3)
CHLORIDE: 105 mmol/L (ref 101–111)
CO2: 24 mmol/L (ref 22–32)
CREATININE: 0.87 mg/dL (ref 0.44–1.00)
GFR calc Af Amer: >60 mL/min (ref >60)
GFR calc non Af Amer: >60 mL/min (ref >60)
Glucose, Bld: 102 mg/dL — ABNORMAL HIGH (ref 65–99)
Potassium: 3.7 mmol/L (ref 3.5–5.1)
SODIUM: 138 mmol/L (ref 135–145)
Total Bilirubin: 0.9 mg/dL (ref 0.3–1.2)
Total Protein: 6.7 g/dL (ref 6.5–8.1)

## 2017-10-07 LAB — CBC WITH DIFFERENTIAL/PLATELET
BASOS ABS: 0 10*3/uL (ref 0.0–0.1)
Basophils Relative: 0 %
EOS ABS: 0.1 10*3/uL (ref 0.0–0.7)
EOS PCT: 2 %
HCT: 35.6 % — ABNORMAL LOW (ref 36.0–46.0)
HEMOGLOBIN: 11.9 g/dL — AB (ref 12.0–15.0)
LYMPHS ABS: 2.1 10*3/uL (ref 0.7–4.0)
LYMPHS PCT: 34 %
MCH: 31.7 pg (ref 26.0–34.0)
MCHC: 33.4 g/dL (ref 30.0–36.0)
MCV: 94.9 fL (ref 78.0–100.0)
Monocytes Absolute: 0.3 10*3/uL (ref 0.1–1.0)
Monocytes Relative: 5 %
NEUTROS PCT: 59 %
Neutro Abs: 3.6 10*3/uL (ref 1.7–7.7)
PLATELETS: 219 10*3/uL (ref 150–400)
RBC: 3.75 MIL/uL — AB (ref 3.87–5.11)
RDW: 12.6 % (ref 11.5–15.5)
WBC: 6.2 10*3/uL (ref 4.0–10.5)

## 2017-10-07 LAB — URINALYSIS, ROUTINE W REFLEX MICROSCOPIC
Bacteria, UA: NONE SEEN
Bilirubin Urine: NEGATIVE
GLUCOSE, UA: NEGATIVE mg/dL
HGB URINE DIPSTICK: NEGATIVE
Ketones, ur: NEGATIVE mg/dL
NITRITE: NEGATIVE
PH: 6 (ref 5.0–8.0)
Protein, ur: NEGATIVE mg/dL
SPECIFIC GRAVITY, URINE: 1.006 (ref 1.005–1.030)

## 2017-10-07 LAB — ABO/RH: ABO/RH(D): A POS

## 2017-10-07 LAB — APTT: APTT: 27 s (ref 24–36)

## 2017-10-07 LAB — TYPE AND SCREEN
ABO/RH(D): A POS
Antibody Screen: NEGATIVE

## 2017-10-07 LAB — PROTIME-INR
INR: 0.98
Prothrombin Time: 12.9 seconds (ref 11.4–15.2)

## 2017-10-07 LAB — SURGICAL PCR SCREEN
MRSA, PCR: NEGATIVE
Staphylococcus aureus: NEGATIVE

## 2017-10-08 LAB — URINE CULTURE: CULTURE: NO GROWTH

## 2017-10-09 ENCOUNTER — Other Ambulatory Visit: Payer: Self-pay | Admitting: Orthopedic Surgery

## 2017-10-09 NOTE — Care Plan (Unsigned)
Spoke with patient prior to surgery. She is planning to discharge to home with her family and HHPT provided by [facility]. She has requested Verl Dicker, PT. Referral has been sent. She will transition to OPPT at [facility] on 10/29/17 @ 100. She is scheduled to follow up with Dr. [name] on 10/31/17 at 1150.   Please contact Ladell Heads, RN CM at the office should concerns arise or plans need to change. [phone]

## 2017-10-16 MED ORDER — CEFAZOLIN SODIUM-DEXTROSE 2-4 GM/100ML-% IV SOLN
2.0000 g | INTRAVENOUS | Status: AC
  Administered 2017-10-17: 2 g via INTRAVENOUS
  Filled 2017-10-16: qty 100

## 2017-10-16 MED ORDER — TRANEXAMIC ACID 1000 MG/10ML IV SOLN
2000.0000 mg | INTRAVENOUS | Status: AC
  Administered 2017-10-17: 2000 mg via TOPICAL
  Filled 2017-10-16: qty 20

## 2017-10-16 MED ORDER — SODIUM CHLORIDE 0.9 % IV SOLN: INTRAVENOUS | Status: DC

## 2017-10-16 MED ORDER — TRANEXAMIC ACID 1000 MG/10ML IV SOLN
1000.0000 mg | INTRAVENOUS | Status: AC
  Administered 2017-10-17: 1000 mg via INTRAVENOUS
  Filled 2017-10-16: qty 1100

## 2017-10-16 MED ORDER — BUPIVACAINE LIPOSOME 1.3 % IJ SUSP
20.0000 mL | INTRAMUSCULAR | Status: AC
  Administered 2017-10-17: 20 mL
  Filled 2017-10-16: qty 20

## 2017-10-16 NOTE — Anesthesia Preprocedure Evaluation (Addendum)
Anesthesia Evaluation  Patient identified by MRN, date of birth, ID band Patient awake    Reviewed: Allergy & Precautions, NPO status , Patient's Chart, lab work & pertinent test results  History of Anesthesia Complications Negative for: history of anesthetic complications  Airway Mallampati: II  TM Distance: >3 FB Neck ROM: Full    Dental  (+) Dental Advisory Given   Pulmonary neg pulmonary ROS,    breath sounds clear to auscultation       Cardiovascular hypertension (does not required BP meds),  Rhythm:Regular Rate:Normal     Neuro/Psych glaucoma    GI/Hepatic Neg liver ROS, GERD  Medicated and Controlled,  Endo/Other  Hypothyroidism   Renal/GU negative Renal ROS     Musculoskeletal  (+) Arthritis , Osteoarthritis,    Abdominal   Peds  Hematology negative hematology ROS (+)   Anesthesia Other Findings   Reproductive/Obstetrics                            Anesthesia Physical Anesthesia Plan  ASA: II  Anesthesia Plan: Spinal   Post-op Pain Management:  Regional for Post-op pain   Induction:   PONV Risk Score and Plan: 2 and Ondansetron and Dexamethasone  Airway Management Planned: Natural Airway and Nasal Cannula  Additional Equipment:   Intra-op Plan:   Post-operative Plan:   Informed Consent: I have reviewed the patients History and Physical, chart, labs and discussed the procedure including the risks, benefits and alternatives for the proposed anesthesia with the patient or authorized representative who has indicated his/her understanding and acceptance.   Dental advisory given  Plan Discussed with: CRNA and Surgeon  Anesthesia Plan Comments: (Plan routine monitors, SAB with adductor canal block for post op analgesia)        Anesthesia Quick Evaluation

## 2017-10-17 ENCOUNTER — Encounter (HOSPITAL_COMMUNITY): Payer: Self-pay | Admitting: *Deleted

## 2017-10-17 ENCOUNTER — Inpatient Hospital Stay (HOSPITAL_COMMUNITY): Payer: Medicare Other | Admitting: Anesthesiology

## 2017-10-17 ENCOUNTER — Other Ambulatory Visit: Payer: Self-pay

## 2017-10-17 ENCOUNTER — Observation Stay (HOSPITAL_COMMUNITY)
Admission: RE | Admit: 2017-10-17 | Discharge: 2017-10-18 | Disposition: A | Discharge status: Home / Self Care | Payer: Medicare Other | Source: Ambulatory Visit | Attending: Orthopedic Surgery | Admitting: Orthopedic Surgery

## 2017-10-17 ENCOUNTER — Encounter (HOSPITAL_COMMUNITY)
Admission: RE | Disposition: A | Discharge status: Home / Self Care | Payer: Self-pay | Source: Ambulatory Visit | Attending: Orthopedic Surgery

## 2017-10-17 DIAGNOSIS — E039 Hypothyroidism, unspecified: Secondary | ICD-10-CM | POA: Insufficient documentation

## 2017-10-17 DIAGNOSIS — D649 Anemia, unspecified: Secondary | ICD-10-CM | POA: Insufficient documentation

## 2017-10-17 DIAGNOSIS — M1711 Unilateral primary osteoarthritis, right knee: Principal | ICD-10-CM | POA: Insufficient documentation

## 2017-10-17 DIAGNOSIS — M21061 Valgus deformity, not elsewhere classified, right knee: Secondary | ICD-10-CM | POA: Insufficient documentation

## 2017-10-17 DIAGNOSIS — Z96652 Presence of left artificial knee joint: Secondary | ICD-10-CM | POA: Insufficient documentation

## 2017-10-17 DIAGNOSIS — K219 Gastro-esophageal reflux disease without esophagitis: Secondary | ICD-10-CM | POA: Insufficient documentation

## 2017-10-17 DIAGNOSIS — E7800 Pure hypercholesterolemia, unspecified: Secondary | ICD-10-CM | POA: Diagnosis not present

## 2017-10-17 DIAGNOSIS — Z96659 Presence of unspecified artificial knee joint: Secondary | ICD-10-CM

## 2017-10-17 DIAGNOSIS — Z79899 Other long term (current) drug therapy: Secondary | ICD-10-CM | POA: Diagnosis not present

## 2017-10-17 DIAGNOSIS — I10 Essential (primary) hypertension: Secondary | ICD-10-CM | POA: Insufficient documentation

## 2017-10-17 DIAGNOSIS — G8918 Other acute postprocedural pain: Secondary | ICD-10-CM | POA: Diagnosis not present

## 2017-10-17 HISTORY — PX: TOTAL KNEE ARTHROPLASTY: SHX125

## 2017-10-17 SURGERY — ARTHROPLASTY, KNEE, TOTAL
Anesthesia: Spinal | Site: Knee | Laterality: Right

## 2017-10-17 MED ORDER — GLYCOPYRROLATE 0.2 MG/ML IJ SOLN
INTRAMUSCULAR | Status: DC | PRN
  Administered 2017-10-17: 0.1 mg via INTRAVENOUS

## 2017-10-17 MED ORDER — RISAQUAD PO CAPS
1.0000 | ORAL_CAPSULE | Freq: Every day | ORAL | Status: DC
  Administered 2017-10-18: 1 via ORAL
  Filled 2017-10-17: qty 1

## 2017-10-17 MED ORDER — BUPIVACAINE-EPINEPHRINE (PF) 0.25% -1:200000 IJ SOLN
INTRAMUSCULAR | Status: DC | PRN
  Administered 2017-10-17: 50 mL

## 2017-10-17 MED ORDER — FAMOTIDINE 20 MG PO TABS
20.0000 mg | ORAL_TABLET | Freq: Every day | ORAL | Status: DC
  Administered 2017-10-18: 20 mg via ORAL
  Filled 2017-10-17: qty 1

## 2017-10-17 MED ORDER — BUPIVACAINE-EPINEPHRINE (PF) 0.5% -1:200000 IJ SOLN
INTRAMUSCULAR | Status: AC
  Filled 2017-10-17: qty 30

## 2017-10-17 MED ORDER — FENTANYL CITRATE (PF) 250 MCG/5ML IJ SOLN
INTRAMUSCULAR | Status: DC | PRN
  Administered 2017-10-17 (×3): 50 ug via INTRAVENOUS

## 2017-10-17 MED ORDER — FLEET ENEMA 7-19 GM/118ML RE ENEM: 1.0000 | ENEMA | Freq: Once | RECTAL | Status: DC | PRN

## 2017-10-17 MED ORDER — HYDROMORPHONE HCL 1 MG/ML IJ SOLN: 0.2500 mg | INTRAMUSCULAR | Status: DC | PRN

## 2017-10-17 MED ORDER — FENTANYL CITRATE (PF) 250 MCG/5ML IJ SOLN
INTRAMUSCULAR | Status: AC
  Filled 2017-10-17: qty 5

## 2017-10-17 MED ORDER — VANCOMYCIN HCL IN DEXTROSE 1-5 GM/200ML-% IV SOLN: 1000.0000 mg | Freq: Two times a day (BID) | INTRAVENOUS | Status: DC

## 2017-10-17 MED ORDER — MIDAZOLAM HCL 2 MG/2ML IJ SOLN
INTRAMUSCULAR | Status: DC | PRN
  Administered 2017-10-17: 1 mg via INTRAVENOUS

## 2017-10-17 MED ORDER — DEXAMETHASONE SODIUM PHOSPHATE 10 MG/ML IJ SOLN
INTRAMUSCULAR | Status: AC
  Filled 2017-10-17: qty 1

## 2017-10-17 MED ORDER — BUPIVACAINE-EPINEPHRINE 0.25% -1:200000 IJ SOLN
INTRAMUSCULAR | Status: AC
  Filled 2017-10-17: qty 1

## 2017-10-17 MED ORDER — ONDANSETRON HCL 4 MG PO TABS
4.0000 mg | ORAL_TABLET | Freq: Four times a day (QID) | ORAL | Status: DC | PRN
  Filled 2017-10-17: qty 1

## 2017-10-17 MED ORDER — PROPOFOL 500 MG/50ML IV EMUL
INTRAVENOUS | Status: DC | PRN
  Administered 2017-10-17: 25 ug/kg/min via INTRAVENOUS

## 2017-10-17 MED ORDER — POLYETHYLENE GLYCOL 3350 17 G PO PACK: 17.0000 g | PACK | Freq: Every day | ORAL | Status: DC | PRN

## 2017-10-17 MED ORDER — ONDANSETRON HCL 4 MG/2ML IJ SOLN: 4.0000 mg | Freq: Four times a day (QID) | INTRAMUSCULAR | Status: DC | PRN

## 2017-10-17 MED ORDER — PROPOFOL 1000 MG/100ML IV EMUL
INTRAVENOUS | Status: AC
  Filled 2017-10-17: qty 100

## 2017-10-17 MED ORDER — LEVOTHYROXINE SODIUM 50 MCG PO TABS
50.0000 ug | ORAL_TABLET | Freq: Every day | ORAL | Status: DC
  Administered 2017-10-18: 50 ug via ORAL
  Filled 2017-10-17: qty 1

## 2017-10-17 MED ORDER — PROMETHAZINE HCL 25 MG/ML IJ SOLN: 6.2500 mg | INTRAMUSCULAR | Status: DC | PRN

## 2017-10-17 MED ORDER — DORZOLAMIDE HCL-TIMOLOL MAL 2-0.5 % OP SOLN
1.0000 [drp] | Freq: Two times a day (BID) | OPHTHALMIC | Status: DC
  Administered 2017-10-17 – 2017-10-18 (×2): 1 [drp] via OPHTHALMIC
  Filled 2017-10-17: qty 10

## 2017-10-17 MED ORDER — 0.9 % SODIUM CHLORIDE (POUR BTL) OPTIME
TOPICAL | Status: DC | PRN
  Administered 2017-10-17: 1000 mL

## 2017-10-17 MED ORDER — PROPOFOL 10 MG/ML IV BOLUS
INTRAVENOUS | Status: DC | PRN
  Administered 2017-10-17: 30 mg via INTRAVENOUS
  Administered 2017-10-17: 25 mg via INTRAVENOUS
  Administered 2017-10-17 (×2): 20 mg via INTRAVENOUS
  Administered 2017-10-17: 30 mg via INTRAVENOUS
  Administered 2017-10-17 (×3): 25 mg via INTRAVENOUS
  Administered 2017-10-17: 20 mg via INTRAVENOUS
  Administered 2017-10-17: 25 mg via INTRAVENOUS

## 2017-10-17 MED ORDER — OXYCODONE HCL 5 MG PO TABS
5.0000 mg | ORAL_TABLET | ORAL | Status: DC | PRN
  Administered 2017-10-17 – 2017-10-18 (×6): 5 mg via ORAL
  Filled 2017-10-17 (×6): qty 1

## 2017-10-17 MED ORDER — LIDOCAINE HCL (CARDIAC) 20 MG/ML IV SOLN
INTRAVENOUS | Status: DC | PRN
  Administered 2017-10-17: 60 mg via INTRATRACHEAL

## 2017-10-17 MED ORDER — PHENOL 1.4 % MT LIQD: 1.0000 | OROMUCOSAL | Status: DC | PRN

## 2017-10-17 MED ORDER — CHLORHEXIDINE GLUCONATE 4 % EX LIQD: 60.0000 mL | Freq: Once | CUTANEOUS | Status: DC

## 2017-10-17 MED ORDER — LACTATED RINGERS IV SOLN
INTRAVENOUS | Status: DC | PRN
  Administered 2017-10-17 (×2): via INTRAVENOUS

## 2017-10-17 MED ORDER — OXYCODONE HCL 5 MG PO TABS: ORAL_TABLET | ORAL | 0 refills | Status: DC

## 2017-10-17 MED ORDER — BISACODYL 10 MG RE SUPP: 10.0000 mg | Freq: Every day | RECTAL | Status: DC | PRN

## 2017-10-17 MED ORDER — PHENYLEPHRINE HCL 10 MG/ML IJ SOLN
INTRAVENOUS | Status: DC | PRN
  Administered 2017-10-17: 25 ug/min via INTRAVENOUS

## 2017-10-17 MED ORDER — LIDOCAINE HCL (CARDIAC) 20 MG/ML IV SOLN
INTRAVENOUS | Status: AC
  Filled 2017-10-17: qty 5

## 2017-10-17 MED ORDER — BUPIVACAINE-EPINEPHRINE (PF) 0.5% -1:200000 IJ SOLN
INTRAMUSCULAR | Status: DC | PRN
  Administered 2017-10-17: 20 mL via PERINEURAL

## 2017-10-17 MED ORDER — APIXABAN 2.5 MG PO TABS
2.5000 mg | ORAL_TABLET | Freq: Two times a day (BID) | ORAL | Status: DC
  Administered 2017-10-18: 2.5 mg via ORAL
  Filled 2017-10-17: qty 1

## 2017-10-17 MED ORDER — ONDANSETRON HCL 4 MG/2ML IJ SOLN
INTRAMUSCULAR | Status: DC | PRN
  Administered 2017-10-17: 4 mg via INTRAVENOUS

## 2017-10-17 MED ORDER — MIDAZOLAM HCL 2 MG/2ML IJ SOLN: 0.5000 mg | Freq: Once | INTRAMUSCULAR | Status: DC | PRN

## 2017-10-17 MED ORDER — MEPERIDINE HCL 50 MG/ML IJ SOLN: 6.2500 mg | INTRAMUSCULAR | Status: DC | PRN

## 2017-10-17 MED ORDER — DOCUSATE SODIUM 100 MG PO CAPS
100.0000 mg | ORAL_CAPSULE | Freq: Two times a day (BID) | ORAL | Status: DC
  Administered 2017-10-17 – 2017-10-18 (×3): 100 mg via ORAL
  Filled 2017-10-17 (×2): qty 1

## 2017-10-17 MED ORDER — SODIUM CHLORIDE 0.9% FLUSH
INTRAVENOUS | Status: DC | PRN
  Administered 2017-10-17: 50 mL

## 2017-10-17 MED ORDER — SODIUM CHLORIDE 0.9 % IR SOLN
Status: DC | PRN
  Administered 2017-10-17: 3000 mL

## 2017-10-17 MED ORDER — METOCLOPRAMIDE HCL 5 MG PO TABS: 5.0000 mg | ORAL_TABLET | Freq: Three times a day (TID) | ORAL | Status: DC | PRN

## 2017-10-17 MED ORDER — TRANEXAMIC ACID 1000 MG/10ML IV SOLN
1000.0000 mg | Freq: Once | INTRAVENOUS | Status: AC
  Administered 2017-10-17: 1000 mg via INTRAVENOUS
  Filled 2017-10-17: qty 10

## 2017-10-17 MED ORDER — ACETAMINOPHEN 325 MG PO TABS
325.0000 mg | ORAL_TABLET | Freq: Four times a day (QID) | ORAL | Status: DC | PRN
  Filled 2017-10-17: qty 2

## 2017-10-17 MED ORDER — BUPIVACAINE IN DEXTROSE 0.75-8.25 % IT SOLN
INTRATHECAL | Status: DC | PRN
  Administered 2017-10-17: 12 mg via INTRATHECAL

## 2017-10-17 MED ORDER — MIDAZOLAM HCL 2 MG/2ML IJ SOLN
INTRAMUSCULAR | Status: AC
  Filled 2017-10-17: qty 2

## 2017-10-17 MED ORDER — APIXABAN 2.5 MG PO TABS: 2.5000 mg | ORAL_TABLET | Freq: Two times a day (BID) | ORAL | 0 refills | Status: DC

## 2017-10-17 MED ORDER — LATANOPROST 0.005 % OP SOLN
1.0000 [drp] | Freq: Every day | OPHTHALMIC | Status: DC
  Administered 2017-10-17: 1 [drp] via OPHTHALMIC
  Filled 2017-10-17: qty 2.5

## 2017-10-17 MED ORDER — CALCIUM CARBONATE 1250 (500 CA) MG PO TABS
1250.0000 mg | ORAL_TABLET | Freq: Every day | ORAL | Status: DC
  Administered 2017-10-18: 1250 mg via ORAL
  Filled 2017-10-17: qty 1

## 2017-10-17 MED ORDER — LORATADINE 10 MG PO TABS
10.0000 mg | ORAL_TABLET | Freq: Every day | ORAL | Status: DC
  Administered 2017-10-17 – 2017-10-18 (×2): 10 mg via ORAL
  Filled 2017-10-17: qty 1

## 2017-10-17 MED ORDER — HYDROMORPHONE HCL 1 MG/ML IJ SOLN: 0.5000 mg | INTRAMUSCULAR | Status: DC | PRN

## 2017-10-17 MED ORDER — METOCLOPRAMIDE HCL 5 MG/ML IJ SOLN: 5.0000 mg | Freq: Three times a day (TID) | INTRAMUSCULAR | Status: DC | PRN

## 2017-10-17 MED ORDER — SODIUM CHLORIDE 0.9 % IV SOLN
INTRAVENOUS | Status: DC
  Administered 2017-10-17: 11:00:00 via INTRAVENOUS

## 2017-10-17 MED ORDER — ONDANSETRON HCL 4 MG/2ML IJ SOLN
INTRAMUSCULAR | Status: AC
  Filled 2017-10-17: qty 2

## 2017-10-17 MED ORDER — CEFAZOLIN SODIUM-DEXTROSE 2-4 GM/100ML-% IV SOLN
2.0000 g | Freq: Three times a day (TID) | INTRAVENOUS | Status: AC
  Administered 2017-10-17 (×2): 2 g via INTRAVENOUS
  Filled 2017-10-17 (×2): qty 100

## 2017-10-17 MED ORDER — ADULT MULTIVITAMIN W/MINERALS CH
1.0000 | ORAL_TABLET | Freq: Every day | ORAL | Status: DC
  Administered 2017-10-17: 1 via ORAL
  Filled 2017-10-17: qty 1

## 2017-10-17 MED ORDER — SIMVASTATIN 20 MG PO TABS
20.0000 mg | ORAL_TABLET | Freq: Every evening | ORAL | Status: DC
  Administered 2017-10-17: 20 mg via ORAL
  Filled 2017-10-17: qty 1

## 2017-10-17 MED ORDER — DEXAMETHASONE SODIUM PHOSPHATE 10 MG/ML IJ SOLN
INTRAMUSCULAR | Status: DC | PRN
  Administered 2017-10-17: 10 mg via INTRAVENOUS

## 2017-10-17 MED ORDER — ACETAMINOPHEN 500 MG PO TABS
1000.0000 mg | ORAL_TABLET | Freq: Four times a day (QID) | ORAL | Status: AC
  Filled 2017-10-17: qty 2

## 2017-10-17 MED ORDER — MENTHOL 3 MG MT LOZG: 1.0000 | LOZENGE | OROMUCOSAL | Status: DC | PRN

## 2017-10-17 SURGICAL SUPPLY — 63 items
BAG DECANTER FOR FLEXI CONT (MISCELLANEOUS) ×2 IMPLANT
BANDAGE ACE 4X5 VEL STRL LF (GAUZE/BANDAGES/DRESSINGS) ×2 IMPLANT
BANDAGE ACE 6X5 VEL STRL LF (GAUZE/BANDAGES/DRESSINGS) ×2 IMPLANT
BANDAGE ESMARK 6X9 LF (GAUZE/BANDAGES/DRESSINGS) ×1 IMPLANT
BLADE SAGITTAL 25.0X1.19X90 (BLADE) ×2 IMPLANT
BLADE SAW SAG 90X13X1.27 (BLADE) ×2 IMPLANT
BNDG ESMARK 6X9 LF (GAUZE/BANDAGES/DRESSINGS) ×2
BOWL SMART MIX CTS (DISPOSABLE) ×2 IMPLANT
CAP KNEE TOTAL 3 SIGMA ×2 IMPLANT
CEMENT HV SMART SET (Cement) ×4 IMPLANT
COVER SURGICAL LIGHT HANDLE (MISCELLANEOUS) ×2 IMPLANT
CUFF TOURNIQUET SINGLE 34IN LL (TOURNIQUET CUFF) ×2 IMPLANT
DECANTER SPIKE VIAL GLASS SM (MISCELLANEOUS) ×2 IMPLANT
DRAPE EXTREMITY T 121X128X90 (DRAPE) ×2 IMPLANT
DRAPE INCISE IOBAN 66X45 STRL (DRAPES) ×2 IMPLANT
DRAPE ORTHO SPLIT 77X108 STRL (DRAPES) ×2
DRAPE SURG ORHT 6 SPLT 77X108 (DRAPES) ×2 IMPLANT
DRAPE U-SHAPE 47X51 STRL (DRAPES) ×2 IMPLANT
DRSG ADAPTIC 3X8 NADH LF (GAUZE/BANDAGES/DRESSINGS) ×2 IMPLANT
DRSG PAD ABDOMINAL 8X10 ST (GAUZE/BANDAGES/DRESSINGS) ×4 IMPLANT
DURAPREP 26ML APPLICATOR (WOUND CARE) ×2 IMPLANT
ELECT REM PT RETURN 9FT ADLT (ELECTROSURGICAL) ×2
ELECTRODE REM PT RTRN 9FT ADLT (ELECTROSURGICAL) ×1 IMPLANT
EVACUATOR 1/8 PVC DRAIN (DRAIN) IMPLANT
FACESHIELD WRAPAROUND (MASK) ×2 IMPLANT
GAUZE SPONGE 4X4 12PLY STRL (GAUZE/BANDAGES/DRESSINGS) ×2 IMPLANT
GLOVE BIOGEL PI IND STRL 8 (GLOVE) ×4 IMPLANT
GLOVE BIOGEL PI INDICATOR 8 (GLOVE) ×4
GLOVE ORTHO TXT STRL SZ7.5 (GLOVE) ×2 IMPLANT
GLOVE SURG ORTHO 8.0 STRL STRW (GLOVE) ×2 IMPLANT
GLOVE SURG SS PI 7.0 STRL IVOR (GLOVE) ×2 IMPLANT
GOWN STRL REUS W/ TWL LRG LVL3 (GOWN DISPOSABLE) ×1 IMPLANT
GOWN STRL REUS W/ TWL XL LVL3 (GOWN DISPOSABLE) ×1 IMPLANT
GOWN STRL REUS W/TWL 2XL LVL3 (GOWN DISPOSABLE) ×2 IMPLANT
GOWN STRL REUS W/TWL LRG LVL3 (GOWN DISPOSABLE) ×1
GOWN STRL REUS W/TWL XL LVL3 (GOWN DISPOSABLE) ×1
HANDPIECE INTERPULSE COAX TIP (DISPOSABLE) ×1
HOOD PEEL AWAY FACE SHEILD DIS (HOOD) ×2 IMPLANT
IMMOBILIZER KNEE 22 UNIV (SOFTGOODS) ×2 IMPLANT
KIT BASIN OR (CUSTOM PROCEDURE TRAY) ×2 IMPLANT
KIT ROOM TURNOVER OR (KITS) ×2 IMPLANT
MANIFOLD NEPTUNE II (INSTRUMENTS) ×2 IMPLANT
NEEDLE 22X1 1/2 (OR ONLY) (NEEDLE) ×4 IMPLANT
NS IRRIG 1000ML POUR BTL (IV SOLUTION) ×2 IMPLANT
PACK TOTAL JOINT (CUSTOM PROCEDURE TRAY) ×2 IMPLANT
PAD ARMBOARD 7.5X6 YLW CONV (MISCELLANEOUS) ×2 IMPLANT
PAD CAST 4YDX4 CTTN HI CHSV (CAST SUPPLIES) ×1 IMPLANT
PADDING CAST COTTON 4X4 STRL (CAST SUPPLIES) ×1
PADDING CAST COTTON 6X4 STRL (CAST SUPPLIES) ×2 IMPLANT
SET HNDPC FAN SPRY TIP SCT (DISPOSABLE) ×1 IMPLANT
SLEEVE SCD COMPRESS KNEE MED (MISCELLANEOUS) ×2 IMPLANT
STAPLER VISISTAT 35W (STAPLE) ×2 IMPLANT
SUCTION FRAZIER HANDLE 10FR (MISCELLANEOUS) ×1
SUCTION TUBE FRAZIER 10FR DISP (MISCELLANEOUS) ×1 IMPLANT
SUT ETHIBOND NAB CT1 #1 30IN (SUTURE) IMPLANT
SUT VIC AB 0 CT1 27 (SUTURE) ×1
SUT VIC AB 0 CT1 27XBRD ANBCTR (SUTURE) ×1 IMPLANT
SUT VIC AB 2-0 CT1 27 (SUTURE) ×1
SUT VIC AB 2-0 CT1 TAPERPNT 27 (SUTURE) ×1 IMPLANT
SYR CONTROL 10ML LL (SYRINGE) ×4 IMPLANT
TOWEL GREEN STERILE (TOWEL DISPOSABLE) ×2 IMPLANT
TRAY CATH 16FR W/PLASTIC CATH (SET/KITS/TRAYS/PACK) IMPLANT
WATER STERILE IRR 1000ML POUR (IV SOLUTION) ×2 IMPLANT

## 2017-10-17 NOTE — Progress Notes (Signed)
Prescriptions given to patient early due to pharmacy not being open on the weekends

## 2017-10-17 NOTE — Anesthesia Procedure Notes (Signed)
Anesthesia Regional Block: Adductor canal block   Pre-Anesthetic Checklist: ,, timeout performed, Correct Patient, Correct Site, Correct Laterality, Correct Procedure, Correct Position, site marked, Risks and benefits discussed,  Surgical consent,  Pre-op evaluation,  At surgeon's request and post-op pain management  Laterality: Right and Lower  Prep: chloraprep       Needles:  Injection technique: Single-shot  Needle Type: Echogenic Needle     Needle Length: 9cm  Needle Gauge: 21     Additional Needles:   Procedures:,,,, ultrasound used (permanent image in chart),,,,  Narrative:  Start time: 10/17/2017 7:07 AM End time: 10/17/2017 7:15 AM Injection made incrementally with aspirations every 5 mL.  Performed by: Personally  Anesthesiologist: Annye Asa, MD  Additional Notes: Pt identified in Holding room.  Monitors applied. Working IV access confirmed. Sterile prep, drape R thigh.  #21ga ECHOgenic needle into adductor canal with US guidance.  20cc 0.5% Bupivacaine with 1:200k epi injected incrementally after negative test dose.  Patient asymptomatic, VSS, no heme aspirated, tolerated well.  Jenita Seashore, MD

## 2017-10-17 NOTE — Progress Notes (Signed)
Orthopedic Tech Progress Note Patient Details:  Rebekah King 1941/05/14 [id number]  CPM Right Knee CPM Right Knee: On Right Knee Flexion (Degrees): 90 Right Knee Extension (Degrees): 0  Post Interventions Patient Tolerated: Well Instructions Provided: Care of device, Adjustment of device Knee immobilizer was already applied.  [name] 10/17/2017, 10:37 AM

## 2017-10-17 NOTE — Transfer of Care (Signed)
Immediate Anesthesia Transfer of Care Note  Patient: Rebekah King  Procedure(s) Performed: TOTAL KNEE ARTHROPLASTY (Right Knee)  Patient Location: PACU  Anesthesia Type:Spinal  Level of Consciousness: awake, alert , drowsy and patient cooperative  Airway & Oxygen Therapy: Patient Spontanous Breathing and Patient connected to nasal cannula oxygen  Post-op Assessment: Report given to RN, Post -op Vital signs reviewed and stable, Patient moving all extremities X 4 and Patient able to stick tongue midline  Post vital signs: Reviewed and stable  Last Vitals:  Vitals Value Taken Time  BP 155/81 10/17/2017  9:53 AM  Temp    Pulse 69 10/17/2017  9:53 AM  Resp 10 10/17/2017  9:53 AM  SpO2 99 % 10/17/2017  9:53 AM  Vitals shown include unvalidated device data.  Last Pain:  Vitals:   10/17/17 0720  TempSrc:   PainSc: 5          Complications: No apparent anesthesia complications

## 2017-10-17 NOTE — Brief Op Note (Signed)
10/17/2017  9:44 AM  PATIENT:  Rebekah King  77 y.o. female  PRE-OPERATIVE DIAGNOSIS:  OA RIGHT KNEE  POST-OPERATIVE DIAGNOSIS:  OA RIGHT KNEE  PROCEDURE:  Procedure(s): TOTAL KNEE ARTHROPLASTY (Right)  SURGEON:  Surgeon(s) and Role:    Earlie Server, MD - Primary  PHYSICIAN ASSISTANT: Chriss Czar, PA-C  ASSISTANTS:  ANESTHESIA:   local and spinal  EBL:  50 mL   BLOOD ADMINISTERED:none  DRAINS: none   LOCAL MEDICATIONS USED:  MARCAINE     SPECIMEN:  No Specimen  DISPOSITION OF SPECIMEN:  N/A  COUNTS:  YES  TOURNIQUET:  * Missing tourniquet times found for documented tourniquets in log: [id number] *  DICTATION: .Other Dictation: Dictation Number unknown  PLAN OF CARE: Admit to inpatient   PATIENT DISPOSITION:  PACU - hemodynamically stable.   Delay start of Pharmacological VTE agent (>24hrs) due to surgical blood loss or risk of bleeding: yes

## 2017-10-17 NOTE — Anesthesia Postprocedure Evaluation (Signed)
Anesthesia Post Note  Patient: Rebekah King  Procedure(s) Performed: TOTAL KNEE ARTHROPLASTY (Right Knee)     Patient location during evaluation: PACU Anesthesia Type: Spinal Level of consciousness: awake and alert, oriented and patient cooperative Pain management: pain level controlled Vital Signs Assessment: post-procedure vital signs reviewed and stable Respiratory status: spontaneous breathing, nonlabored ventilation and respiratory function stable Cardiovascular status: blood pressure returned to baseline and stable Postop Assessment: spinal receding and patient able to bend at knees Anesthetic complications: no    Last Vitals:  Vitals Value Taken Time  BP    Temp    Pulse    Resp    SpO2      Last Pain:  Vitals:   10/17/17 1059  TempSrc: Oral  PainSc:                  Zakyah Yanes,E. Kaoir Loree

## 2017-10-17 NOTE — Op Note (Signed)
NAME:  Rebekah, King NO.:  [id number]  MEDICAL RECORD NO.:  [id number]  LOCATION:  [facility]                         FACILITY:  [facility]  PHYSICIAN:  Lockie Pares, M.D.    DATE OF BIRTH:  1940-10-01  DATE OF PROCEDURE:  10/17/2017 DATE OF DISCHARGE:                              OPERATIVE REPORT   PREOPERATIVE DIAGNOSIS:  Severe osteoarthritis with valgus deformity, right knee.  POSTOPERATIVE DIAGNOSIS:  Severe osteoarthritis with valgus deformity, right knee.  OPERATION:  Right total knee replacement (Sigma cemented size 3 femur, tibia 10 mm bearing with 35 mm all poly patella).  SURGEON:  Lockie Pares, M.D.  ASSISTANT:  Chriss Czar, PA-C.  TOURNIQUET TIME:  57 minutes.  ANESTHESIA:  Spinal anesthetic.  DESCRIPTION OF PROCEDURE:  Supine position.  Exsanguination of the leg, inflation of thigh tourniquet to 350.  Straight skin incision with a medial parapatellar approach to the knee made.  A 5 degree 11 mm cut was made on the distal femur, followed by cutting about 3 to 4 mm below the most diseased lateral compartment with the extension gap measured at 10 mm.  Femur was sized to be a size 3, followed by placement of all-in-1 cutting block with appropriate degree of external rotation, and completion of the chamfers and anterior-posterior cuts.  PCL was completely released.  Small osteophytes were removed from posterior aspect of the knee and complete excision of the menisci as well. Flexion gap was measured to be equal.  The extension gap at 10 mm.  [name] was cut for the tibia, followed by placement of the box cut for the femur with the trial femur and tibia placed with a size 3, 10 mm bearing with full extension noted, resolution of the valgus deformity.  Patella was cut leaving about 14 mm of native patella for 35 mm all poly trial. The trials were all deemed to be acceptable relative to positioning, range of motion, no tendency for bearing, spin out,  and good balancing of the ligaments.  Cement was prepared on the back table.  Knee was infiltrated in the capsular subcutaneous tissue with a mixture of Exparel and Marcaine. TXA sponge was used for anticoagulation on the surgical field.  The final components were inserted, tibia, followed by femur, patella.  We used a trial bearing while the cement hardened.  Small amounts of cement were removed.  The tourniquet was released.  After the trial bearing was removed, no excessive bleeding was noted.  Small bleeders were coagulated.  Final bearing was placed, followed by closure with #1 Ethibond, 0 Vicryl, 2-0 Vicryl, and skin clips.  Lightly compressive sterile dressing and knee immobilizer applied, taken to recovery in stable condition.     Lockie Pares, M.D.     WDC/MEDQ  D:  10/17/2017  T:  10/17/2017  Job:  [id number]

## 2017-10-17 NOTE — Interval H&P Note (Signed)
History and Physical Interval Note:  10/17/2017 7:32 AM  Rebekah King  has presented today for surgery, with the diagnosis of OA RIGHT KNEE  The various methods of treatment have been discussed with the patient and family. After consideration of risks, benefits and other options for treatment, the patient has consented to  Procedure(s): TOTAL KNEE ARTHROPLASTY (Right) as a surgical intervention .  The patient's history has been reviewed, patient examined, no change in status, stable for surgery.  I have reviewed the patient's chart and labs.  Questions were answered to the patient's satisfaction.     Yvette Rack

## 2017-10-17 NOTE — Anesthesia Procedure Notes (Signed)
Spinal  Patient location during procedure: OR End time: 10/17/2017 7:52 AM Staffing Anesthesiologist: Annye Asa, MD Performed: anesthesiologist  Preanesthetic Checklist Completed: patient identified, site marked, surgical consent, pre-op evaluation, timeout performed, IV checked, risks and benefits discussed and monitors and equipment checked Spinal Block Patient position: sitting Prep: site prepped and draped and DuraPrep Patient monitoring: blood pressure, continuous pulse ox, cardiac monitor and heart rate Approach: midline Location: L3-4 Injection technique: single-shot Needle Needle type: Quincke  Needle gauge: 25 G Needle length: 9 cm Additional Notes Pt identified in Operating room.  Monitors applied. Working IV access confirmed. Sterile prep, drape lumbar spine.  1% lido local L 3,4.  All attempts os with #24ga Pencan, #25ga Quincke into clear CSF L 3,4.  12mg  0.75% Bupivacaine with dextrose injected with asp CSF beginning and end of injection.  Patient asymptomatic, VSS, no heme aspirated, tolerated well.  Jenita Seashore, MD

## 2017-10-17 NOTE — Evaluation (Signed)
Physical Therapy Evaluation Patient Details Name: Rebekah King MRN: [id number] DOB: 1940-08-31 Today's Date: 10/17/2017   History of Present Illness  Pt is a 77 y/o female s/p elective R TKA. PMH includesHTN.   Clinical Impression  Pt is s/p surgery above with deficits below. Pt tolerated gait well, however, slightly unsteady requiring min to min guard A with use of RW. Educated about knee precautions and supine HEP. Will continue to follow acutely to maximize functional mobility independence and safety.     Follow Up Recommendations Follow surgeon's recommendation for DC plan and follow-up therapies;Supervision for mobility/OOB    Equipment Recommendations  None recommended by PT    Recommendations for Other Services OT consult     Precautions / Restrictions Precautions Precautions: Knee Precaution Booklet Issued: Yes (comment) Precaution Comments: Reviewed knee precautious and supine ther ex with pt.  Required Braces or Orthoses: Knee Immobilizer - Right Knee Immobilizer - Right: Other (comment)(until discontinued) Restrictions Weight Bearing Restrictions: Yes RLE Weight Bearing: Weight bearing as tolerated      Mobility  Bed Mobility Overal bed mobility: Needs Assistance Bed Mobility: Sit to Supine       Sit to supine: Min assist   General bed mobility comments: Min A for RLE lift assist.   Transfers Overall transfer level: Needs assistance Equipment used: Rolling walker (2 wheeled) Transfers: Sit to/from [facility] Sit to Stand: Min assist Stand pivot transfers: Min assist       General transfer comment: Min A for lift assist and steadying to come to standing. Verbal cues for safe hand placement. Min A to perform stand pivot to [facility] and back to recliner. Verbal cues for sequencing using RW. Verbal cues for hand placement on RW as pt wanting to reach for front of RW for support.   Ambulation/Gait Ambulation/Gait assistance: Min guard;Min  assist Ambulation Distance (Feet): 50 Feet Assistive device: Rolling walker (2 wheeled) Gait Pattern/deviations: Step-to pattern;Decreased step length - right;Decreased step length - left;Decreased weight shift to right;Antalgic Gait velocity: Decreased  Gait velocity interpretation: Below normal speed for age/gender General Gait Details: Slow, antalgic gait; slightly unsteady. Cues for proximity to device as pt would get to close to front of RW. Cues for upright posture throughout. Verbal and manual cues for sequencing using RW.   Stairs            Wheelchair Mobility    Modified Rankin (Stroke Patients Only)       Balance Overall balance assessment: Needs assistance Sitting-balance support: No upper extremity supported;Feet supported Sitting balance-Leahy Scale: Good     Standing balance support: Bilateral upper extremity supported;During functional activity Standing balance-Leahy Scale: Poor Standing balance comment: Reliant on BUE support.                              Pertinent Vitals/Pain Pain Assessment: Faces Faces Pain Scale: Hurts even more Pain Location: R knee  Pain Descriptors / Indicators: Aching;Operative site guarding Pain Intervention(s): Limited activity within patient's tolerance;Monitored during session;Repositioned    Home Living Family/patient expects to be discharged to:: Private residence Living Arrangements: Spouse/significant other Available Help at Discharge: Family;Available 24 hours/day Type of Home: House Home Access: Stairs to enter Entrance Stairs-Rails: None Entrance Stairs-Number of Steps: 2 Home Layout: Two level;Able to live on main level with bedroom/bathroom Home Equipment: Gilford Rile - 2 wheels;Bedside commode;Other (comment)(CPM )      Prior Function Level of Independence: Independent  Hand Dominance        Extremity/Trunk Assessment   Upper Extremity Assessment Upper Extremity Assessment:  Defer to OT evaluation    Lower Extremity Assessment Lower Extremity Assessment: RLE deficits/detail RLE Deficits / Details: Sensory in tact. Deficits consistent with post op pain and weakness.     Cervical / Trunk Assessment Cervical / Trunk Assessment: Kyphotic  Communication   Communication: No difficulties  Cognition Arousal/Alertness: Awake/alert Behavior During Therapy: WFL for tasks assessed/performed Overall Cognitive Status: Within Functional Limits for tasks assessed                                        General Comments General comments (skin integrity, edema, etc.): Pt's husband and daughter present during session.     Exercises Total Joint Exercises Ankle Circles/Pumps: AROM;Both;20 reps Quad Sets: AROM;Right;10 reps Towel Squeeze: AROM;Both;10 reps   Assessment/Plan    PT Assessment Patient needs continued PT services  PT Problem List Decreased strength;Decreased balance;Decreased range of motion;Decreased mobility;Decreased knowledge of use of DME;Decreased knowledge of precautions;Pain       PT Treatment Interventions DME instruction;Gait training;Therapeutic activities;Functional mobility training;Stair training;Therapeutic exercise;Balance training;Neuromuscular re-education;Patient/family education    PT Goals (Current goals can be found in the Care Plan section)  Acute Rehab PT Goals Patient Stated Goal: to go home tomorrow  PT Goal Formulation: With patient Time For Goal Achievement: 10/31/17 Potential to Achieve Goals: Good    Frequency 7X/week   Barriers to discharge        Co-evaluation               AM-PAC PT "6 Clicks" Daily Activity  Outcome Measure Difficulty turning over in bed (including adjusting bedclothes, sheets and blankets)?: A Little Difficulty moving from lying on back to sitting on the side of the bed? : Unable Difficulty sitting down on and standing up from a chair with arms (e.g., wheelchair,  bedside commode, etc,.)?: Unable Help needed moving to and from a bed to chair (including a wheelchair)?: A Little Help needed walking in hospital room?: A Little Help needed climbing 3-5 steps with a railing? : A Lot 6 Click Score: 13    End of Session Equipment Utilized During Treatment: Gait belt;Right knee immobilizer Activity Tolerance: Patient tolerated treatment well Patient left: in bed;with call bell/phone within reach;with family/visitor present Nurse Communication: Mobility status PT Visit Diagnosis: Unsteadiness on feet (R26.81);Other abnormalities of gait and mobility (R26.89);Pain Pain - Right/Left: Right Pain - part of body: Knee    Time: 1445-1526 PT Time Calculation (min) (ACUTE ONLY): 41 min   Charges:   PT Evaluation $PT Eval Low Complexity: 1 Low PT Treatments $Gait Training: 8-22 mins $Therapeutic Activity: 8-22 mins   PT G Codes:        Leighton Ruff, PT, DPT  Acute Rehabilitation Services  Pager: [phone]   Rudean Hitt 10/17/2017, 5:50 PM

## 2017-10-18 DIAGNOSIS — M1711 Unilateral primary osteoarthritis, right knee: Secondary | ICD-10-CM | POA: Diagnosis not present

## 2017-10-18 DIAGNOSIS — M21061 Valgus deformity, not elsewhere classified, right knee: Secondary | ICD-10-CM | POA: Diagnosis not present

## 2017-10-18 DIAGNOSIS — E7800 Pure hypercholesterolemia, unspecified: Secondary | ICD-10-CM | POA: Diagnosis not present

## 2017-10-18 DIAGNOSIS — I10 Essential (primary) hypertension: Secondary | ICD-10-CM | POA: Diagnosis not present

## 2017-10-18 DIAGNOSIS — E039 Hypothyroidism, unspecified: Secondary | ICD-10-CM | POA: Diagnosis not present

## 2017-10-18 DIAGNOSIS — K219 Gastro-esophageal reflux disease without esophagitis: Secondary | ICD-10-CM | POA: Diagnosis not present

## 2017-10-18 LAB — BASIC METABOLIC PANEL
ANION GAP: 11 (ref 5–15)
BUN: 11 mg/dL (ref 6–20)
CO2: 25 mmol/L (ref 22–32)
Calcium: 9.1 mg/dL (ref 8.9–10.3)
Chloride: 95 mmol/L — ABNORMAL LOW (ref 101–111)
Creatinine, Ser: 0.77 mg/dL (ref 0.44–1.00)
GFR calc Af Amer: >60 mL/min (ref >60)
Glucose, Bld: 105 mg/dL — ABNORMAL HIGH (ref 65–99)
POTASSIUM: 3.3 mmol/L — AB (ref 3.5–5.1)
SODIUM: 131 mmol/L — AB (ref 135–145)

## 2017-10-18 LAB — CBC
HCT: 31.7 % — ABNORMAL LOW (ref 36.0–46.0)
HEMOGLOBIN: 10.6 g/dL — AB (ref 12.0–15.0)
MCH: 30.9 pg (ref 26.0–34.0)
MCHC: 33.4 g/dL (ref 30.0–36.0)
MCV: 92.4 fL (ref 78.0–100.0)
PLATELETS: 226 10*3/uL (ref 150–400)
RBC: 3.43 MIL/uL — AB (ref 3.87–5.11)
RDW: 12.5 % (ref 11.5–15.5)
WBC: 12.6 10*3/uL — ABNORMAL HIGH (ref 4.0–10.5)

## 2017-10-18 LAB — GLUCOSE, CAPILLARY: GLUCOSE-CAPILLARY: 93 mg/dL (ref 65–99)

## 2017-10-18 NOTE — Progress Notes (Addendum)
Physical Therapy Treatment Patient Details Name: Rebekah King MRN: [id number] DOB: [birth year] Today's Date: 10/18/2017    History of Present Illness Pt is a 77 y/o female s/p elective R TKA. PMH includesHTN.     PT Comments    Pt with improved mobility this PM session. This session focused of gait training and stair negotiation. Pt negotiated steps with min A for RW management and ambulated 177ft with min guard and 1 seated rest break. Pt expected to d/c this PM with HHPT to follow up.     Follow Up Recommendations  Follow surgeon's recommendation for DC plan and follow-up therapies;Supervision for mobility/OOB     Equipment Recommendations  None recommended by PT    Recommendations for Other Services OT consult     Precautions / Restrictions Precautions Precautions: Knee Precaution Booklet Issued: No Precaution Comments: Reviewed knee precautious Required Braces or Orthoses: Knee Immobilizer - Right Knee Immobilizer - Right: Other (comment)(until discontinued) Restrictions Weight Bearing Restrictions: Yes RLE Weight Bearing: Weight bearing as tolerated    Mobility  Bed Mobility General bed mobility comments: in chair on arrival  Transfers Overall transfer level: Needs assistance Equipment used: Rolling walker (2 wheeled) Transfers: Sit to/from Stand Sit to Stand: Min guard         General transfer comment: Good hand placement and tehcnique. Min guard for safety. Cues for improved eccentric control.   Ambulation/Gait Ambulation/Gait assistance: Min guard Ambulation Distance (Feet): 60 Feet(x2) Assistive device: Rolling walker (2 wheeled) Gait Pattern/deviations: Step-to pattern;Decreased step length - right;Decreased step length - left;Decreased weight shift to right;Antalgic Gait velocity: Decreased  Gait velocity interpretation: Below normal speed for age/gender General Gait Details: Pt with slow antalgic gait, dificulty achieving full knee extension on R  LE. Cues for postural control and RW proximity and sequencing.    Stairs Stairs: Yes   Stair Management: No rails;Step to pattern;Backwards;With walker Number of Stairs: 2 General stair comments: Min A for RW management and cues for technique. Daughter and husband present for stair training session and will be available to assist pt into home.  Wheelchair Mobility    Modified Rankin (Stroke Patients Only)       Balance Overall balance assessment: Needs assistance Sitting-balance support: No upper extremity supported;Feet supported Sitting balance-Leahy Scale: Good     Standing balance support: No upper extremity supported;During functional activity Standing balance-Leahy Scale: Fair Standing balance comment: static standing                            Cognition Arousal/Alertness: Awake/alert Behavior During Therapy: WFL for tasks assessed/performed Overall Cognitive Status: Within Functional Limits for tasks assessed                                        Exercises      General Comments        Pertinent Vitals/Pain Pain Assessment: 0-10 Pain Score: 5  Faces Pain Scale: Hurts little more Pain Location: R knee Pain Descriptors / Indicators: Aching;Sore Pain Intervention(s): Monitored during session;Repositioned;Limited activity within patient's tolerance    Home Living Family/patient expects to be discharged to:: Private residence Living Arrangements: Spouse/significant other Available Help at Discharge: Family;Available 24 hours/day Type of Home: House Home Access: Stairs to enter Entrance Stairs-Rails: None Home Layout: Two level;Able to live on main level with bedroom/bathroom Home Equipment: Gilford Rile - 2 wheels;Bedside  commode;Other (comment)      Prior Function Level of Independence: Independent          PT Goals (current goals can now be found in the care plan section) Acute Rehab PT Goals Patient Stated Goal: home  today PT Goal Formulation: With patient Time For Goal Achievement: 10/31/17 Potential to Achieve Goals: Good Progress towards PT goals: Progressing toward goals    Frequency    7X/week      PT Plan Current plan remains appropriate    Co-evaluation              AM-PAC PT "6 Clicks" Daily Activity  Outcome Measure  Difficulty turning over in bed (including adjusting bedclothes, sheets and blankets)?: A Little Difficulty moving from lying on back to sitting on the side of the bed? : Unable Difficulty sitting down on and standing up from a chair with arms (e.g., wheelchair, bedside commode, etc,.)?: Unable Help needed moving to and from a bed to chair (including a wheelchair)?: A Little Help needed walking in hospital room?: A Little Help needed climbing 3-5 steps with a railing? : A Little 6 Click Score: 14    End of Session Equipment Utilized During Treatment: Gait belt Activity Tolerance: Patient tolerated treatment well(BP drop during gait training, n/v) Patient left: with call bell/phone within reach;with family/visitor present;in chair;with nursing/sitter in room Nurse Communication: Mobility status PT Visit Diagnosis: Unsteadiness on feet (R26.81);Other abnormalities of gait and mobility (R26.89);Pain Pain - Right/Left: Right Pain - part of body: Knee     Time: 1610-9604 PT Time Calculation (min) (ACUTE ONLY): 24 min  Charges:  $Gait Training: 23-37 mins                    G Codes:      Benjiman Core, [place] Pager [phone] Acute Rehab  Allena Katz 10/18/2017, 3:11 PM

## 2017-10-18 NOTE — Care Management Note (Signed)
Case Management Note  Patient Details  Name: Rebekah King MRN: [id number] Date of Birth: 1941/05/31  Subjective/Objective:      Pt admitted from home for R TKA.  Pt home with husband and independent prior to surgery.  Pt had pre-arranged DME including RW, 3n1, and CPM with [employer].  All equipment has been delivered.  Pt also had [place] services and OP therapy pre-arranged but unsure of [facility] agency name.  Pt is on [facility] list for [facility] PT.  Pt was not on Eliquis PTA.  Pt states has already spoken with pharmacy and co-pay is $45, but she doesn't expect to be on Eliquis more than 3 weeks.             Action/Plan: Pt given 30 day free and copay discount cards.  No further CM needs at this time.   Expected Discharge Date:                  Expected Discharge Plan:  [place]  In-House Referral:  NA  Discharge planning Services  CM Consult  Post Acute Care Choice:  Durable Medical Equipment, Home Health Choice offered to:  Patient  DME Arranged:  3-N-1, Walker rolling, CPM DME Agency:  [employer]  HH Arranged:  PT [place]:  [place]  Status of Service:  Completed, signed off  If discussed at [place] of Stay Meetings, dates discussed:    Additional Comments:  Claudie Leach, RN 10/18/2017, 9:12 AM

## 2017-10-18 NOTE — Progress Notes (Signed)
Orthopedic Tech Progress Note Patient Details:  GLORA HULGAN 1940/09/23 [id number]  Patient ID: KELCE BOUTON, female   DOB: 08-24-1940, 77 y.o.   MRN: [id number]   Hildred Priest 10/18/2017, 12:53 PM Placed pt's rle on cpm @ 0-60 degrees @1255 ' RN notified

## 2017-10-18 NOTE — Evaluation (Signed)
Occupational Therapy Evaluation Patient Details Name: Rebekah King MRN: [id number] DOB: 1940-08-31 Today's Date: 10/18/2017    History of Present Illness Pt is a 77 y/o female s/p elective R TKA. PMH includesHTN.    Clinical Impression   Pt reports she was independent with ADL PTA. Currently pt overall min guard for functional mobility and min assist for LB ADL. Pt planning to d/c home with 24/7 supervision from her husband. Pt would benefit from continued skilled OT to address established goals.    Follow Up Recommendations  No OT follow up;Supervision/Assistance - 24 hour(initially)    Equipment Recommendations  None recommended by OT    Recommendations for Other Services       Precautions / Restrictions Precautions Precautions: Knee Precaution Booklet Issued: No Precaution Comments: Reviewed knee precautious and supine ther ex with pt.  Required Braces or Orthoses: Knee Immobilizer - Right Knee Immobilizer - Right: Other (comment)(until discontinued) Restrictions Weight Bearing Restrictions: Yes RLE Weight Bearing: Weight bearing as tolerated      Mobility Bed Mobility Overal bed mobility: Needs Assistance Bed Mobility: Sit to Supine       Sit to supine: Min guard   General bed mobility comments: Min guard for RLE into bed.   Transfers Overall transfer level: Needs assistance Equipment used: Rolling walker (2 wheeled) Transfers: Sit to/from Stand Sit to Stand: Min guard       General transfer comment: Good hand placement and tehcnique. Min guard for safety    Balance Overall balance assessment: Needs assistance Sitting-balance support: No upper extremity supported;Feet supported Sitting balance-Leahy Scale: Good     Standing balance support: No upper extremity supported;During functional activity Standing balance-Leahy Scale: Fair Standing balance comment: static standing                           ADL either performed or assessed with  clinical judgement   ADL Overall ADL's : Needs assistance/impaired Eating/Feeding: Set up;Sitting   Grooming: Set up;Supervision/safety;Sitting;Wash/dry hands   Upper Body Bathing: Set up;Supervision/ safety;Sitting   Lower Body Bathing: Minimal assistance;Sit to/from stand   Upper Body Dressing : Set up;Supervision/safety;Sitting   Lower Body Dressing: Minimal assistance;Sit to/from stand Lower Body Dressing Details (indicate cue type and reason): Educated on compensatory strategies for LB ADL; husband to assist as needed Toilet Transfer: Min guard;Ambulation;BSC;RW   Toileting- Clothing Manipulation and Hygiene: Supervision/safety;Sitting/lateral lean Toileting - Clothing Manipulation Details (indicate cue type and reason): for peri care   Tub/Shower Transfer Details (indicate cue type and reason): Educated on use of 3 in 1 in tub as a seat; pt would benefit from tub transfer practice. Discussed sponge bathing until [facility] therpist able to perform tub transfer education Functional mobility during ADLs: Min guard;Rolling walker       Vision         Perception     Praxis      Pertinent Vitals/Pain Pain Assessment: Faces Pain Score: 5  Faces Pain Scale: Hurts little more Pain Location: R knee Pain Descriptors / Indicators: Aching;Sore Pain Intervention(s): Monitored during session;Repositioned     Hand Dominance     Extremity/Trunk Assessment Upper Extremity Assessment Upper Extremity Assessment: Overall WFL for tasks assessed   Lower Extremity Assessment Lower Extremity Assessment: Defer to PT evaluation   Cervical / Trunk Assessment Cervical / Trunk Assessment: Kyphotic   Communication Communication Communication: No difficulties   Cognition Arousal/Alertness: Awake/alert Behavior During Therapy: WFL for tasks assessed/performed Overall Cognitive Status:  Within Functional Limits for tasks assessed                                     General  Comments    Exercises   Shoulder Instructions      Home Living Family/patient expects to be discharged to:: Private residence Living Arrangements: Spouse/significant other Available Help at Discharge: Family;Available 24 hours/day Type of Home: House Home Access: Stairs to enter [employer] of Steps: 2 Entrance Stairs-Rails: None Home Layout: Two level;Able to live on main level with bedroom/bathroom     Bathroom Shower/Tub: Tub/shower unit;Curtain   Bathroom Toilet: Standard     Home Equipment: Environmental consultant - 2 wheels;Bedside commode;Other (comment)          Prior Functioning/Environment Level of Independence: Independent                 OT Problem List: Decreased strength;Decreased range of motion;Decreased activity tolerance;Impaired balance (sitting and/or standing);Decreased knowledge of use of DME or AE;Decreased knowledge of precautions;Pain;Increased edema      OT Treatment/Interventions: Self-care/ADL training;Energy conservation;DME and/or AE instruction;Therapeutic activities;Patient/family education;Balance training    OT Goals(Current goals can be found in the care plan section) Acute Rehab OT Goals Patient Stated Goal: home today OT Goal Formulation: With patient/family Time For Goal Achievement: 11/01/17 Potential to Achieve Goals: Good ADL Goals Pt Will Perform Tub/Shower Transfer: Tub transfer;with supervision;ambulating;3 in 1;rolling walker  OT Frequency: Min 2X/week   Barriers to D/C:            Co-evaluation              AM-PAC PT "6 Clicks" Daily Activity     Outcome Measure Help from another person eating meals?: None Help from another person taking care of personal grooming?: A Little Help from another person toileting, which includes using toliet, bedpan, or urinal?: A Little Help from another person bathing (including washing, rinsing, drying)?: A Little Help from another person to put on and taking off regular upper  body clothing?: A Little Help from another person to put on and taking off regular lower body clothing?: A Little 6 Click Score: 19   End of Session Equipment Utilized During Treatment: Gait belt;Rolling walker CPM Right Knee CPM Right Knee: Off Additional Comments: Foot roll  Activity Tolerance: Patient tolerated treatment well Patient left: in bed;with call bell/phone within reach;with nursing/sitter in room;with family/visitor present  OT Visit Diagnosis: Unsteadiness on feet (R26.81);Other abnormalities of gait and mobility (R26.89);Muscle weakness (generalized) (M62.81);Pain Pain - Right/Left: Right Pain - part of body: Knee                Time: 1144-1200 OT Time Calculation (min): 16 min Charges:  OT General Charges $OT Visit: 1 Visit OT Evaluation $OT Eval Moderate Complexity: 1 Mod G-Codes:     Rebekah King A. Ulice Brilliant, M.S., OTR/L Pager: [place] 10/18/2017, 2:33 PM

## 2017-10-18 NOTE — Discharge Summary (Signed)
PATIENT ID: Rebekah King        MRN:  [id number]          DOB/AGE: [birth year] / 77 y.o.    DISCHARGE SUMMARY  ADMISSION DATE:    10/17/2017 DISCHARGE DATE:   10/18/2017   ADMISSION DIAGNOSIS: OA RIGHT KNEE    DISCHARGE DIAGNOSIS:  OA RIGHT KNEE    ADDITIONAL DIAGNOSIS: Active Problems:   S/P knee replacement  Past Medical History:  Diagnosis Date  . Anemia   . Arthritis   . Complication of anesthesia    CHLOSTROPHOBIC- TROUBLE WITH ANYTHING OVER FACE  . GERD (gastroesophageal reflux disease)   . Glaucoma   . Hypertension    UP  AND DOWN, NOT CONSISTANTLY HIGH  . Hypothyroidism   . UTI (urinary tract infection)     PROCEDURE: Procedure(s): TOTAL KNEE ARTHROPLASTY Right on 10/17/2017  CONSULTS: PT/OT    HISTORY:  See H&P in chart  HOSPITAL COURSE:  Rebekah King is a 77 y.o. admitted on 10/17/2017 and found to have a diagnosis of OA RIGHT KNEE.  After appropriate laboratory studies were obtained  they were taken to the operating room on 10/17/2017 and underwent  Procedure(s): TOTAL KNEE ARTHROPLASTY  Right.   They were given perioperative antibiotics:  Anti-infectives (From admission, onward)   Start     Dose/Rate Route Frequency Ordered Stop   10/17/17 1330  ceFAZolin (ANCEF) IVPB 2g/100 mL premix     2 g 200 mL/hr over 30 Minutes Intravenous Every 8 hours 10/17/17 1136 10/17/17 2138   10/17/17 1145  vancomycin (VANCOCIN) IVPB 1000 mg/200 mL premix  Status:  Discontinued     1,000 mg 200 mL/hr over 60 Minutes Intravenous Every 12 hours 10/17/17 1130 10/17/17 1134   10/17/17 0600  ceFAZolin (ANCEF) IVPB 2g/100 mL premix     2 g 200 mL/hr over 30 Minutes Intravenous To [facility] 10/16/17 1240 10/17/17 0800    .  Tolerated the procedure well.    POD #1, allowed out of bed to a chair.  PT for ambulation and exercise program. IV saline locked.  O2 discontionued.  POD #2, continued PT and ambulation.  The remainder of the hospital course was  dedicated to ambulation and strengthening.   The patient was discharged on 1 Day Post-Op in  Stable condition.  Blood products given:none  DIAGNOSTIC STUDIES: Recent vital signs:  Patient Vitals for the past 24 hrs:  BP Temp Temp src Pulse Resp SpO2  10/18/17 0746 (!) 174/66 99.1 F (37.3 C) Oral 88 16 98 %  10/18/17 0555 (!) 178/68 98.1 F (36.7 C) Oral 90 17 98 %  10/18/17 0007 (!) 163/71 97.9 F (36.6 C) Oral 87 17 100 %  10/17/17 1942 (!) 174/74 98.2 F (36.8 C) Oral 80 18 97 %  10/17/17 1059 (!) 165/73 97.7 F (36.5 C) Oral 64 18 99 %       Recent laboratory studies: Recent Labs    10/18/17 0703  WBC 12.6*  HGB 10.6*  HCT 31.7*  PLT 226   Recent Labs    10/18/17 0703  NA 131*  K 3.3*  CL 95*  CO2 25  BUN 11  CREATININE 0.77  GLUCOSE 105*  CALCIUM 9.1   Lab Results  Component Value Date   INR 0.98 10/07/2017     Recent Radiographic Studies :  Dg Chest 2 View  Result Date: 10/07/2017 CLINICAL DATA:  Preoperative evaluation for upcoming knee replacement EXAM: CHEST -  2 VIEW COMPARISON:  None. FINDINGS: The heart size and mediastinal contours are within normal limits. Both lungs are clear. The visualized skeletal structures are unremarkable. IMPRESSION: No active cardiopulmonary disease. Electronically Signed   By: Inez Catalina M.D.   On: 10/07/2017 20:08    DISCHARGE INSTRUCTIONS:   DISCHARGE MEDICATIONS:   Allergies as of 10/18/2017      Reactions   Amoxicillin-pot Clavulanate Diarrhea   GI pain Has patient had a PCN reaction causing immediate rash, facial/tongue/throat swelling, SOB or lightheadedness with hypotension: No Has patient had a PCN reaction causing severe rash involving mucus membranes or skin necrosis: No Has patient had a PCN reaction that required hospitalization: No Has patient had a PCN reaction occurring within the last 10 years: No If all of the above answers are "NO", then may proceed with Cephalosporin use.   Sulfa Antibiotics  Nausea Only, Rash   Hands go numb      Medication List    STOP taking these medications   aspirin EC 81 MG tablet   estradiol 0.5 MG tablet Commonly known as:  ESTRACE   ibuprofen 200 MG tablet Commonly known as:  ADVIL,MOTRIN   loratadine 10 MG tablet Commonly known as:  CLARITIN     TAKE these medications   ALIGN 4 MG Caps Take 4 mg by mouth daily.   apixaban 2.5 MG Tabs tablet Commonly known as:  ELIQUIS Take 1 tablet (2.5 mg total) by mouth 2 (two) times daily.   CALCIUM 600 600 MG Tabs tablet Generic drug:  calcium carbonate Take 600 mg by mouth daily.   cetirizine 10 MG tablet Commonly known as:  ZYRTEC Take 10 mg by mouth daily.   dorzolamide-timolol 22.3-6.8 MG/ML ophthalmic solution Commonly known as:  COSOPT Place 1 drop into the right eye 2 (two) times daily.   FISH OIL-VITAMIN D PO Take 1 capsule by mouth daily. 1200 mg of fish oil 2000 units   latanoprost 0.005 % ophthalmic solution Commonly known as:  XALATAN Place 1 drop into both eyes at bedtime.   levothyroxine 50 MCG tablet Commonly known as:  SYNTHROID, LEVOTHROID Take 50 mcg by mouth daily before breakfast.   multivitamin with minerals Tabs tablet Take 1 tablet by mouth daily.   mupirocin ointment 2 % Commonly known as:  BACTROBAN APPLY TO IN EACH NOSTRIL AREA THREE TIMES DAILY.   nitrofurantoin 50 MG capsule Commonly known as:  MACRODANTIN Take 50 mg by mouth at bedtime.   oxyCODONE 5 MG immediate release tablet Commonly known as:  Oxy IR/ROXICODONE 1-2 tabs po q4-6hrs prn pain   ranitidine 150 MG tablet Commonly known as:  ZANTAC Take 150 mg by mouth 2 (two) times daily.   simvastatin 20 MG tablet Commonly known as:  ZOCOR Take 20 mg by mouth every evening.            Durable Medical Equipment  (From admission, onward)        Start     Ordered   10/17/17 1130  DME Walker rolling  Once    Question:  Patient needs a walker to treat with the following condition   Answer:  S/P knee replacement   10/17/17 1130   10/17/17 1130  DME 3 n 1  Once     10/17/17 1130      FOLLOW UP VISIT:   Follow-up Information    Earlie Server, MD. Schedule an appointment as soon as possible for a visit in 2 weeks.   Specialty:  Orthopedic Surgery Contact information: [place] [address] [phone]           DISPOSITION:   Home  CONDITION:  Stable   Chriss Czar, PA-C  10/18/2017 10:49 AM

## 2017-10-18 NOTE — Care Management CC44 (Signed)
Condition Code 44 Documentation Completed  Patient Details  Name: Rebekah King MRN: [id number] Date of Birth: 02/03/1941   Condition Code 44 given:  Yes Patient signature on Condition Code 44 notice:  Yes Documentation of 2 MD's agreement:  Yes Code 44 added to claim:  Yes    Claudie Leach, RN 10/18/2017, 12:48 PM

## 2017-10-18 NOTE — Discharge Instructions (Signed)
INSTRUCTIONS AFTER JOINT REPLACEMENT  ° °o Remove items at home which could result in a fall. This includes throw rugs or furniture in walking pathways °o ICE to the affected joint every three hours while awake for 30 minutes at a time, for at least the first 3-5 days, and then as needed for pain and swelling.  Continue to use ice for pain and swelling. You may notice swelling that will progress down to the foot and ankle.  This is normal after surgery.  Elevate your leg when you are not up walking on it.   °o Continue to use the breathing machine you got in the hospital (incentive spirometer) which will help keep your temperature down.  It is common for your temperature to cycle up and down following surgery, especially at night when you are not up moving around and exerting yourself.  The breathing machine keeps your lungs expanded and your temperature down. ° ° °DIET:  As you were doing prior to hospitalization, we recommend a well-balanced diet. ° °DRESSING / WOUND CARE / SHOWERING ° °You may change your dressing 3-5 days after surgery.  Then change the dressing every day with sterile gauze.  Please use good hand washing techniques before changing the dressing.  Do not use any lotions or creams on the incision until instructed by your surgeon. ° °ACTIVITY ° °o Increase activity slowly as tolerated, but follow the weight bearing instructions below.   °o No driving for 6 weeks or until further direction given by your physician.  You cannot drive while taking narcotics.  °o No lifting or carrying greater than 10 lbs. until further directed by your surgeon. °o Avoid periods of inactivity such as sitting longer than an hour when not asleep. This helps prevent blood clots.  °o You may return to work once you are authorized by your doctor.  ° ° ° °WEIGHT BEARING  ° °Weight bearing as tolerated with assist device (walker, cane, etc) as directed, use it as long as suggested by your surgeon or therapist, typically at  least 4-6 weeks. ° ° °EXERCISES ° °Results after joint replacement surgery are often greatly improved when you follow the exercise, range of motion and muscle strengthening exercises prescribed by your doctor. Safety measures are also important to protect the joint from further injury. Any time any of these exercises cause you to have increased pain or swelling, decrease what you are doing until you are comfortable again and then slowly increase them. If you have problems or questions, call your caregiver or physical therapist for advice.  ° °Rehabilitation is important following a joint replacement. After just a few days of immobilization, the muscles of the leg can become weakened and shrink (atrophy).  These exercises are designed to build up the tone and strength of the thigh and leg muscles and to improve motion. Often times heat used for twenty to thirty minutes before working out will loosen up your tissues and help with improving the range of motion but do not use heat for the first two weeks following surgery (sometimes heat can increase post-operative swelling).  ° °These exercises can be done on a training (exercise) mat, on the floor, on a table or on a bed. Use whatever works the best and is most comfortable for you.    Use music or television while you are exercising so that the exercises are a pleasant break in your day. This will make your life better with the exercises acting as a break   in your routine that you can look forward to.   Perform all exercises about fifteen times, three times per day or as directed.  You should exercise both the operative leg and the other leg as well. ° °Exercises include: °  °• Quad Sets - Tighten up the muscle on the front of the thigh (Quad) and hold for 5-10 seconds.   °• Straight Leg Raises - With your knee straight (if you were given a brace, keep it on), lift the leg to 60 degrees, hold for 3 seconds, and slowly lower the leg.  Perform this exercise against  resistance later as your leg gets stronger.  °• Leg Slides: Lying on your back, slowly slide your foot toward your buttocks, bending your knee up off the floor (only go as far as is comfortable). Then slowly slide your foot back down until your leg is flat on the floor again.  °• Angel Wings: Lying on your back spread your legs to the side as far apart as you can without causing discomfort.  °• Hamstring Strength:  Lying on your back, push your heel against the floor with your leg straight by tightening up the muscles of your buttocks.  Repeat, but this time bend your knee to a comfortable angle, and push your heel against the floor.  You may put a pillow under the heel to make it more comfortable if necessary.  ° °A rehabilitation program following joint replacement surgery can speed recovery and prevent re-injury in the future due to weakened muscles. Contact your doctor or a physical therapist for more information on knee rehabilitation.  ° ° °CONSTIPATION ° °Constipation is defined medically as fewer than three stools per week and severe constipation as less than one stool per week.  Even if you have a regular bowel pattern at home, your normal regimen is likely to be disrupted due to multiple reasons following surgery.  Combination of anesthesia, postoperative narcotics, change in appetite and fluid intake all can affect your bowels.  ° °YOU MUST use at least one of the following options; they are listed in order of increasing strength to get the job done.  They are all available over the counter, and you may need to use some, POSSIBLY even all of these options:   ° °Drink plenty of fluids (prune juice may be helpful) and high fiber foods °Colace 100 mg by mouth twice a day  °Senokot for constipation as directed and as needed Dulcolax (bisacodyl), take with full glass of water  °Miralax (polyethylene glycol) once or twice a day as needed. ° °If you have tried all these things and are unable to have a bowel  movement in the first 3-4 days after surgery call either your surgeon or your primary doctor.   ° °If you experience loose stools or diarrhea, hold the medications until you stool forms back up.  If your symptoms do not get better within 1 week or if they get worse, check with your doctor.  If you experience "the worst abdominal pain ever" or develop nausea or vomiting, please contact the office immediately for further recommendations for treatment. ° ° °ITCHING:  If you experience itching with your medications, try taking only a single pain pill, or even half a pain pill at a time.  You can also use Benadryl over the counter for itching or also to help with sleep.  ° °TED HOSE STOCKINGS:  Use stockings on both legs until for at least 2 weeks or as   directed by physician office. They may be removed at night for sleeping. ° °MEDICATIONS:  See your medication summary on the “After Visit Summary” that nursing will review with you.  You may have some home medications which will be placed on hold until you complete the course of blood thinner medication.  It is important for you to complete the blood thinner medication as prescribed. ° °PRECAUTIONS:  If you experience chest pain or shortness of breath - call 911 immediately for transfer to the hospital emergency department.  ° °If you develop a fever greater that 101 F, purulent drainage from wound, increased redness or drainage from wound, foul odor from the wound/dressing, or calf pain - CONTACT YOUR SURGEON.   °                                                °FOLLOW-UP APPOINTMENTS:  If you do not already have a post-op appointment, please call the office for an appointment to be seen by your surgeon.  Guidelines for how soon to be seen are listed in your “After Visit Summary”, but are typically between 1-4 weeks after surgery. ° °OTHER INSTRUCTIONS:  ° °Knee Replacement:  Do not place pillow under knee, focus on keeping the knee straight while resting. CPM  instructions: 0-90 degrees, 2 hours in the morning, 2 hours in the afternoon, and 2 hours in the evening. Place foam block, curve side up under heel at all times except when in CPM or when walking.  DO NOT modify, tear, cut, or change the foam block in any way. ° °MAKE SURE YOU:  °• Understand these instructions.  °• Get help right away if you are not doing well or get worse.  ° ° °Thank you for letting us be a part of your medical care team.  It is a privilege we respect greatly.  We hope these instructions will help you stay on track for a fast and full recovery!  ° ° ° °Information on my medicine - ELIQUIS® (apixaban) ° °This medication education was reviewed with me or my healthcare representative as part of my discharge preparation.  The pharmacist that spoke with me during my hospital stay was:  Chen Holzman Dien, RPH ° °Why was Eliquis® prescribed for you? °Eliquis® was prescribed for you to reduce the risk of blood clots forming after orthopedic surgery.   ° °What do You need to know about Eliquis®? °Take your Eliquis® TWICE DAILY - one tablet in the morning and one tablet in the evening with or without food.  It would be best to take the dose about the same time each day. ° °If you have difficulty swallowing the tablet whole please discuss with your pharmacist how to take the medication safely. ° °Take Eliquis® exactly as prescribed by your doctor and DO NOT stop taking Eliquis® without talking to the doctor who prescribed the medication.  Stopping without other medication to take the place of Eliquis® may increase your risk of developing a clot. ° °After discharge, you should have regular check-up appointments with your healthcare provider that is prescribing your Eliquis®. ° °What do you do if you miss a dose? °If a dose of ELIQUIS® is not taken at the scheduled time, take it as soon as possible on the same day and twice-daily administration should be resumed.  The dose should not   be doubled to make up for  a missed dose.  Do not take more than one tablet of ELIQUIS at the same time. ° °Important Safety Information °A possible side effect of Eliquis® is bleeding. You should call your healthcare provider right away if you experience any of the following: °? Bleeding from an injury or your nose that does not stop. °? Unusual colored urine (red or dark brown) or unusual colored stools (red or black). °? Unusual bruising for unknown reasons. °? A serious fall or if you hit your head (even if there is no bleeding). ° °Some medicines may interact with Eliquis® and might increase your risk of bleeding or clotting while on Eliquis®. To help avoid this, consult your healthcare provider or pharmacist prior to using any new prescription or non-prescription medications, including herbals, vitamins, non-steroidal anti-inflammatory drugs (NSAIDs) and supplements. ° °This website has more information on Eliquis® (apixaban): http://www.eliquis.com/eliquis/home ° °

## 2017-10-18 NOTE — Progress Notes (Signed)
Physical Therapy Treatment Patient Details Name: Rebekah King MRN: [id number] DOB: Dec 17, 1940 Today's Date: 10/18/2017    History of Present Illness Pt is a 77 y/o female s/p elective R TKA. PMH includesHTN.     PT Comments    Today's skilled session focused on gait training and HEP. Pt required min g for safety with all mobilities. After providing instructions on technique for stair negotiation pt became very dizzy and nauseous, requiring seated rest break. Pt reported feeling as though she may pass out and head slumped forward. Eyes remained open and pt was able to give minimal responsed to questions. BP 111/54 SpO2 97% HR 54 BPM. BP taken again after rest with feet elevated BP 97/42. Pt with 1 episode of vomiting. Further activity deffered to PM session. RN aware. Pt left in recliner, reclined with feet elevated and husband present. Will check back in the PM to complete stair training and HEP.   Follow Up Recommendations  Follow surgeon's recommendation for DC plan and follow-up therapies;Supervision for mobility/OOB     Equipment Recommendations  None recommended by PT    Recommendations for Other Services OT consult     Precautions / Restrictions Precautions Precautions: Knee Precaution Booklet Issued: Yes (comment) Precaution Comments: Reviewed knee precautious and supine ther ex with pt.  Required Braces or Orthoses: Knee Immobilizer - Right Knee Immobilizer - Right: Other (comment)(until discontinued) Restrictions Weight Bearing Restrictions: Yes RLE Weight Bearing: Weight bearing as tolerated    Mobility  Bed Mobility Overal bed mobility: Needs Assistance             General bed mobility comments: in chair on arrival  Transfers Overall transfer level: Needs assistance Equipment used: Rolling walker (2 wheeled) Transfers: Sit to/from [employer] Transfers Sit to Stand: Min guard Stand pivot transfers: Min guard       General transfer comment:  min guard for safety and cues for hand placement  Ambulation/Gait Ambulation/Gait assistance: Min guard Ambulation Distance (Feet): 40 Feet Assistive device: Rolling walker (2 wheeled) Gait Pattern/deviations: Step-to pattern;Decreased step length - right;Decreased step length - left;Decreased weight shift to right;Antalgic Gait velocity: Decreased  Gait velocity interpretation: Below normal speed for age/gender General Gait Details: Pt with slow antalgic gait, dificulty achieving full knee extension on R LE   Stairs Stairs: Yes       General stair comments: Instruction provided on stiars, however as pt went to ascend first step she became very dizzy and nauseous, requiring seated rest break. Pt reported feeling as though she may pass out and head slumped forward. Eyes remained open and pt was able to give minimal responsed to questions. BP 111/54 SpO2 97% HR 54 BPM. BP taken again after rest with feet elevated BP 97/42. 1 episode of vomiting. Further activity deffered to PM session.  Wheelchair Mobility    Modified Rankin (Stroke Patients Only)       Balance Overall balance assessment: Needs assistance Sitting-balance support: No upper extremity supported;Feet supported Sitting balance-Leahy Scale: Good     Standing balance support: Bilateral upper extremity supported;During functional activity Standing balance-Leahy Scale: Poor Standing balance comment: Reliant on BUE support.                             Cognition Arousal/Alertness: Awake/alert Behavior During Therapy: WFL for tasks assessed/performed Overall Cognitive Status: Within Functional Limits for tasks assessed  Exercises Total Joint Exercises Short Arc Quad: AROM;Right;10 reps;Seated Hip ABduction/ADduction: AROM;Right;10 reps;Seated Straight Leg Raises: AROM;Right;10 reps;Seated    General Comments General comments (skin integrity, edema,  etc.): Pt's husban present for session      Pertinent Vitals/Pain Pain Assessment: 0-10 Pain Score: 5  Pain Location: R knee with movement Pain Descriptors / Indicators: Aching;Operative site guarding Pain Intervention(s): Monitored during session;Limited activity within patient's tolerance    Home Living                      Prior Function            PT Goals (current goals can now be found in the care plan section) Acute Rehab PT Goals Patient Stated Goal: to go home tomorrow  PT Goal Formulation: With patient Time For Goal Achievement: 10/31/17 Potential to Achieve Goals: Good Progress towards PT goals: Progressing toward goals    Frequency    7X/week      PT Plan Current plan remains appropriate    Co-evaluation              AM-PAC PT "6 Clicks" Daily Activity  Outcome Measure  Difficulty turning over in bed (including adjusting bedclothes, sheets and blankets)?: A Little Difficulty moving from lying on back to sitting on the side of the bed? : Unable Difficulty sitting down on and standing up from a chair with arms (e.g., wheelchair, bedside commode, etc,.)?: Unable Help needed moving to and from a bed to chair (including a wheelchair)?: A Little Help needed walking in hospital room?: A Little Help needed climbing 3-5 steps with a railing? : A Little 6 Click Score: 14    End of Session Equipment Utilized During Treatment: Gait belt Activity Tolerance: Treatment limited secondary to medical complications (Comment)(BP drop during gait training, n/v) Patient left: in bed;with call bell/phone within reach;with family/visitor present Nurse Communication: Mobility status PT Visit Diagnosis: Unsteadiness on feet (R26.81);Other abnormalities of gait and mobility (R26.89);Pain Pain - Right/Left: Right Pain - part of body: Knee     Time: 1013-1056 PT Time Calculation (min) (ACUTE ONLY): 43 min  Charges:  $Gait Training: 8-22 mins $Therapeutic  Exercise: 8-22 mins $Therapeutic Activity: 8-22 mins                    G Codes:       Benjiman Core, [place] Pager [phone] Acute Rehab   Allena Katz 10/18/2017, 11:17 AM

## 2017-10-20 ENCOUNTER — Encounter (HOSPITAL_COMMUNITY): Payer: Self-pay | Admitting: Orthopedic Surgery

## 2017-10-20 DIAGNOSIS — Z96651 Presence of right artificial knee joint: Secondary | ICD-10-CM | POA: Diagnosis not present

## 2017-10-20 DIAGNOSIS — Z471 Aftercare following joint replacement surgery: Secondary | ICD-10-CM | POA: Diagnosis not present

## 2017-10-20 DIAGNOSIS — I10 Essential (primary) hypertension: Secondary | ICD-10-CM | POA: Diagnosis not present

## 2017-10-21 DIAGNOSIS — Z96651 Presence of right artificial knee joint: Secondary | ICD-10-CM | POA: Diagnosis not present

## 2017-10-21 DIAGNOSIS — I10 Essential (primary) hypertension: Secondary | ICD-10-CM | POA: Diagnosis not present

## 2017-10-21 DIAGNOSIS — Z471 Aftercare following joint replacement surgery: Secondary | ICD-10-CM | POA: Diagnosis not present

## 2017-10-23 DIAGNOSIS — I10 Essential (primary) hypertension: Secondary | ICD-10-CM | POA: Diagnosis not present

## 2017-10-23 DIAGNOSIS — Z96651 Presence of right artificial knee joint: Secondary | ICD-10-CM | POA: Diagnosis not present

## 2017-10-23 DIAGNOSIS — Z471 Aftercare following joint replacement surgery: Secondary | ICD-10-CM | POA: Diagnosis not present

## 2017-10-27 DIAGNOSIS — I10 Essential (primary) hypertension: Secondary | ICD-10-CM | POA: Diagnosis not present

## 2017-10-27 DIAGNOSIS — Z96651 Presence of right artificial knee joint: Secondary | ICD-10-CM | POA: Diagnosis not present

## 2017-10-27 DIAGNOSIS — Z471 Aftercare following joint replacement surgery: Secondary | ICD-10-CM | POA: Diagnosis not present

## 2017-10-29 DIAGNOSIS — Z96651 Presence of right artificial knee joint: Secondary | ICD-10-CM | POA: Diagnosis not present

## 2017-10-29 DIAGNOSIS — Z471 Aftercare following joint replacement surgery: Secondary | ICD-10-CM | POA: Diagnosis not present

## 2017-10-29 DIAGNOSIS — I10 Essential (primary) hypertension: Secondary | ICD-10-CM | POA: Diagnosis not present

## 2017-10-30 DIAGNOSIS — M25661 Stiffness of right knee, not elsewhere classified: Secondary | ICD-10-CM | POA: Diagnosis not present

## 2017-10-30 DIAGNOSIS — M25561 Pain in right knee: Secondary | ICD-10-CM | POA: Diagnosis not present

## 2017-10-31 DIAGNOSIS — M1711 Unilateral primary osteoarthritis, right knee: Secondary | ICD-10-CM | POA: Diagnosis not present

## 2017-11-05 DIAGNOSIS — M25561 Pain in right knee: Secondary | ICD-10-CM | POA: Diagnosis not present

## 2017-11-05 DIAGNOSIS — M25661 Stiffness of right knee, not elsewhere classified: Secondary | ICD-10-CM | POA: Diagnosis not present

## 2017-11-06 DIAGNOSIS — M25561 Pain in right knee: Secondary | ICD-10-CM | POA: Diagnosis not present

## 2017-11-06 DIAGNOSIS — M25661 Stiffness of right knee, not elsewhere classified: Secondary | ICD-10-CM | POA: Diagnosis not present

## 2017-11-11 DIAGNOSIS — M25661 Stiffness of right knee, not elsewhere classified: Secondary | ICD-10-CM | POA: Diagnosis not present

## 2017-11-11 DIAGNOSIS — M25561 Pain in right knee: Secondary | ICD-10-CM | POA: Diagnosis not present

## 2017-11-13 DIAGNOSIS — M25561 Pain in right knee: Secondary | ICD-10-CM | POA: Diagnosis not present

## 2017-11-13 DIAGNOSIS — M25661 Stiffness of right knee, not elsewhere classified: Secondary | ICD-10-CM | POA: Diagnosis not present

## 2017-11-18 DIAGNOSIS — M25561 Pain in right knee: Secondary | ICD-10-CM | POA: Diagnosis not present

## 2017-11-18 DIAGNOSIS — M25661 Stiffness of right knee, not elsewhere classified: Secondary | ICD-10-CM | POA: Diagnosis not present

## 2017-11-20 DIAGNOSIS — M25661 Stiffness of right knee, not elsewhere classified: Secondary | ICD-10-CM | POA: Diagnosis not present

## 2017-11-20 DIAGNOSIS — M25561 Pain in right knee: Secondary | ICD-10-CM | POA: Diagnosis not present

## 2017-11-24 DIAGNOSIS — M1711 Unilateral primary osteoarthritis, right knee: Secondary | ICD-10-CM | POA: Diagnosis not present

## 2017-11-25 DIAGNOSIS — M25561 Pain in right knee: Secondary | ICD-10-CM | POA: Diagnosis not present

## 2017-11-25 DIAGNOSIS — M25661 Stiffness of right knee, not elsewhere classified: Secondary | ICD-10-CM | POA: Diagnosis not present

## 2017-11-27 DIAGNOSIS — M25661 Stiffness of right knee, not elsewhere classified: Secondary | ICD-10-CM | POA: Diagnosis not present

## 2017-11-27 DIAGNOSIS — M25561 Pain in right knee: Secondary | ICD-10-CM | POA: Diagnosis not present

## 2017-11-28 DIAGNOSIS — R5383 Other fatigue: Secondary | ICD-10-CM | POA: Diagnosis not present

## 2017-11-28 DIAGNOSIS — M1711 Unilateral primary osteoarthritis, right knee: Secondary | ICD-10-CM | POA: Diagnosis not present

## 2017-11-28 DIAGNOSIS — R7301 Impaired fasting glucose: Secondary | ICD-10-CM | POA: Diagnosis not present

## 2017-11-28 DIAGNOSIS — E039 Hypothyroidism, unspecified: Secondary | ICD-10-CM | POA: Diagnosis not present

## 2017-11-28 DIAGNOSIS — E7800 Pure hypercholesterolemia, unspecified: Secondary | ICD-10-CM | POA: Diagnosis not present

## 2017-12-02 DIAGNOSIS — R7301 Impaired fasting glucose: Secondary | ICD-10-CM | POA: Diagnosis not present

## 2017-12-02 DIAGNOSIS — E039 Hypothyroidism, unspecified: Secondary | ICD-10-CM | POA: Diagnosis not present

## 2017-12-02 DIAGNOSIS — Z1389 Encounter for screening for other disorder: Secondary | ICD-10-CM | POA: Diagnosis not present

## 2017-12-02 DIAGNOSIS — E782 Mixed hyperlipidemia: Secondary | ICD-10-CM | POA: Diagnosis not present

## 2017-12-02 DIAGNOSIS — I10 Essential (primary) hypertension: Secondary | ICD-10-CM | POA: Diagnosis not present

## 2017-12-02 DIAGNOSIS — Z1331 Encounter for screening for depression: Secondary | ICD-10-CM | POA: Diagnosis not present

## 2017-12-02 DIAGNOSIS — N951 Menopausal and female climacteric states: Secondary | ICD-10-CM | POA: Diagnosis not present

## 2017-12-02 DIAGNOSIS — Z0001 Encounter for general adult medical examination with abnormal findings: Secondary | ICD-10-CM | POA: Diagnosis not present

## 2017-12-02 DIAGNOSIS — G3184 Mild cognitive impairment, so stated: Secondary | ICD-10-CM | POA: Diagnosis not present

## 2017-12-02 DIAGNOSIS — Z6824 Body mass index (BMI) 24.0-24.9, adult: Secondary | ICD-10-CM | POA: Diagnosis not present

## 2017-12-04 DIAGNOSIS — Z96651 Presence of right artificial knee joint: Secondary | ICD-10-CM | POA: Diagnosis not present

## 2017-12-04 DIAGNOSIS — M25661 Stiffness of right knee, not elsewhere classified: Secondary | ICD-10-CM | POA: Diagnosis not present

## 2017-12-04 DIAGNOSIS — Z471 Aftercare following joint replacement surgery: Secondary | ICD-10-CM | POA: Diagnosis not present

## 2017-12-04 DIAGNOSIS — R2689 Other abnormalities of gait and mobility: Secondary | ICD-10-CM | POA: Diagnosis not present

## 2017-12-04 DIAGNOSIS — M6281 Muscle weakness (generalized): Secondary | ICD-10-CM | POA: Diagnosis not present

## 2017-12-09 DIAGNOSIS — Z471 Aftercare following joint replacement surgery: Secondary | ICD-10-CM | POA: Diagnosis not present

## 2017-12-09 DIAGNOSIS — M25661 Stiffness of right knee, not elsewhere classified: Secondary | ICD-10-CM | POA: Diagnosis not present

## 2017-12-09 DIAGNOSIS — M6281 Muscle weakness (generalized): Secondary | ICD-10-CM | POA: Diagnosis not present

## 2017-12-09 DIAGNOSIS — R2689 Other abnormalities of gait and mobility: Secondary | ICD-10-CM | POA: Diagnosis not present

## 2017-12-09 DIAGNOSIS — Z96651 Presence of right artificial knee joint: Secondary | ICD-10-CM | POA: Diagnosis not present

## 2017-12-10 ENCOUNTER — Ambulatory Visit (INDEPENDENT_AMBULATORY_CARE_PROVIDER_SITE_OTHER): Payer: Medicare Other | Admitting: Urology

## 2017-12-10 DIAGNOSIS — N3021 Other chronic cystitis with hematuria: Secondary | ICD-10-CM

## 2017-12-10 DIAGNOSIS — R351 Nocturia: Secondary | ICD-10-CM | POA: Diagnosis not present

## 2017-12-11 DIAGNOSIS — R2689 Other abnormalities of gait and mobility: Secondary | ICD-10-CM | POA: Diagnosis not present

## 2017-12-11 DIAGNOSIS — M25661 Stiffness of right knee, not elsewhere classified: Secondary | ICD-10-CM | POA: Diagnosis not present

## 2017-12-11 DIAGNOSIS — M6281 Muscle weakness (generalized): Secondary | ICD-10-CM | POA: Diagnosis not present

## 2017-12-11 DIAGNOSIS — Z96651 Presence of right artificial knee joint: Secondary | ICD-10-CM | POA: Diagnosis not present

## 2017-12-11 DIAGNOSIS — Z471 Aftercare following joint replacement surgery: Secondary | ICD-10-CM | POA: Diagnosis not present

## 2017-12-11 DIAGNOSIS — H401133 Primary open-angle glaucoma, bilateral, severe stage: Secondary | ICD-10-CM | POA: Diagnosis not present

## 2017-12-15 DIAGNOSIS — M25661 Stiffness of right knee, not elsewhere classified: Secondary | ICD-10-CM | POA: Diagnosis not present

## 2017-12-15 DIAGNOSIS — M6281 Muscle weakness (generalized): Secondary | ICD-10-CM | POA: Diagnosis not present

## 2017-12-15 DIAGNOSIS — Z471 Aftercare following joint replacement surgery: Secondary | ICD-10-CM | POA: Diagnosis not present

## 2017-12-15 DIAGNOSIS — Z96651 Presence of right artificial knee joint: Secondary | ICD-10-CM | POA: Diagnosis not present

## 2017-12-15 DIAGNOSIS — R2689 Other abnormalities of gait and mobility: Secondary | ICD-10-CM | POA: Diagnosis not present

## 2017-12-18 DIAGNOSIS — R2689 Other abnormalities of gait and mobility: Secondary | ICD-10-CM | POA: Diagnosis not present

## 2017-12-18 DIAGNOSIS — Z471 Aftercare following joint replacement surgery: Secondary | ICD-10-CM | POA: Diagnosis not present

## 2017-12-18 DIAGNOSIS — M6281 Muscle weakness (generalized): Secondary | ICD-10-CM | POA: Diagnosis not present

## 2017-12-18 DIAGNOSIS — Z96651 Presence of right artificial knee joint: Secondary | ICD-10-CM | POA: Diagnosis not present

## 2017-12-18 DIAGNOSIS — M25661 Stiffness of right knee, not elsewhere classified: Secondary | ICD-10-CM | POA: Diagnosis not present

## 2017-12-23 DIAGNOSIS — Z96651 Presence of right artificial knee joint: Secondary | ICD-10-CM | POA: Diagnosis not present

## 2017-12-23 DIAGNOSIS — R2689 Other abnormalities of gait and mobility: Secondary | ICD-10-CM | POA: Diagnosis not present

## 2017-12-23 DIAGNOSIS — M25661 Stiffness of right knee, not elsewhere classified: Secondary | ICD-10-CM | POA: Diagnosis not present

## 2017-12-23 DIAGNOSIS — M6281 Muscle weakness (generalized): Secondary | ICD-10-CM | POA: Diagnosis not present

## 2017-12-23 DIAGNOSIS — Z471 Aftercare following joint replacement surgery: Secondary | ICD-10-CM | POA: Diagnosis not present

## 2017-12-25 DIAGNOSIS — R2689 Other abnormalities of gait and mobility: Secondary | ICD-10-CM | POA: Diagnosis not present

## 2017-12-25 DIAGNOSIS — Z96651 Presence of right artificial knee joint: Secondary | ICD-10-CM | POA: Diagnosis not present

## 2017-12-25 DIAGNOSIS — M25661 Stiffness of right knee, not elsewhere classified: Secondary | ICD-10-CM | POA: Diagnosis not present

## 2017-12-25 DIAGNOSIS — M6281 Muscle weakness (generalized): Secondary | ICD-10-CM | POA: Diagnosis not present

## 2017-12-25 DIAGNOSIS — Z471 Aftercare following joint replacement surgery: Secondary | ICD-10-CM | POA: Diagnosis not present

## 2018-01-30 DIAGNOSIS — M170 Bilateral primary osteoarthritis of knee: Secondary | ICD-10-CM | POA: Diagnosis not present

## 2018-02-25 DIAGNOSIS — M47816 Spondylosis without myelopathy or radiculopathy, lumbar region: Secondary | ICD-10-CM | POA: Diagnosis not present

## 2018-02-25 DIAGNOSIS — M9903 Segmental and somatic dysfunction of lumbar region: Secondary | ICD-10-CM | POA: Diagnosis not present

## 2018-02-26 DIAGNOSIS — M47816 Spondylosis without myelopathy or radiculopathy, lumbar region: Secondary | ICD-10-CM | POA: Diagnosis not present

## 2018-02-26 DIAGNOSIS — M9903 Segmental and somatic dysfunction of lumbar region: Secondary | ICD-10-CM | POA: Diagnosis not present

## 2018-03-02 DIAGNOSIS — M9903 Segmental and somatic dysfunction of lumbar region: Secondary | ICD-10-CM | POA: Diagnosis not present

## 2018-03-02 DIAGNOSIS — M47816 Spondylosis without myelopathy or radiculopathy, lumbar region: Secondary | ICD-10-CM | POA: Diagnosis not present

## 2018-03-03 DIAGNOSIS — R140 Abdominal distension (gaseous): Secondary | ICD-10-CM | POA: Diagnosis not present

## 2018-03-03 DIAGNOSIS — R1030 Lower abdominal pain, unspecified: Secondary | ICD-10-CM | POA: Diagnosis not present

## 2018-03-03 DIAGNOSIS — M545 Low back pain: Secondary | ICD-10-CM | POA: Diagnosis not present

## 2018-03-03 DIAGNOSIS — Z6823 Body mass index (BMI) 23.0-23.9, adult: Secondary | ICD-10-CM | POA: Diagnosis not present

## 2018-03-06 DIAGNOSIS — M47816 Spondylosis without myelopathy or radiculopathy, lumbar region: Secondary | ICD-10-CM | POA: Diagnosis not present

## 2018-03-06 DIAGNOSIS — M9903 Segmental and somatic dysfunction of lumbar region: Secondary | ICD-10-CM | POA: Diagnosis not present

## 2018-03-09 DIAGNOSIS — M9903 Segmental and somatic dysfunction of lumbar region: Secondary | ICD-10-CM | POA: Diagnosis not present

## 2018-03-09 DIAGNOSIS — M47816 Spondylosis without myelopathy or radiculopathy, lumbar region: Secondary | ICD-10-CM | POA: Diagnosis not present

## 2018-03-12 DIAGNOSIS — M9903 Segmental and somatic dysfunction of lumbar region: Secondary | ICD-10-CM | POA: Diagnosis not present

## 2018-03-12 DIAGNOSIS — M47816 Spondylosis without myelopathy or radiculopathy, lumbar region: Secondary | ICD-10-CM | POA: Diagnosis not present

## 2018-03-16 DIAGNOSIS — M47816 Spondylosis without myelopathy or radiculopathy, lumbar region: Secondary | ICD-10-CM | POA: Diagnosis not present

## 2018-03-16 DIAGNOSIS — M9903 Segmental and somatic dysfunction of lumbar region: Secondary | ICD-10-CM | POA: Diagnosis not present

## 2018-03-24 DIAGNOSIS — M9903 Segmental and somatic dysfunction of lumbar region: Secondary | ICD-10-CM | POA: Diagnosis not present

## 2018-03-24 DIAGNOSIS — M47816 Spondylosis without myelopathy or radiculopathy, lumbar region: Secondary | ICD-10-CM | POA: Diagnosis not present

## 2018-03-31 DIAGNOSIS — L570 Actinic keratosis: Secondary | ICD-10-CM | POA: Diagnosis not present

## 2018-03-31 DIAGNOSIS — L821 Other seborrheic keratosis: Secondary | ICD-10-CM | POA: Diagnosis not present

## 2018-03-31 DIAGNOSIS — M9903 Segmental and somatic dysfunction of lumbar region: Secondary | ICD-10-CM | POA: Diagnosis not present

## 2018-03-31 DIAGNOSIS — D1800 Hemangioma unspecified site: Secondary | ICD-10-CM | POA: Diagnosis not present

## 2018-03-31 DIAGNOSIS — M47816 Spondylosis without myelopathy or radiculopathy, lumbar region: Secondary | ICD-10-CM | POA: Diagnosis not present

## 2018-04-02 DIAGNOSIS — H401113 Primary open-angle glaucoma, right eye, severe stage: Secondary | ICD-10-CM | POA: Diagnosis not present

## 2018-04-02 DIAGNOSIS — H43819 Vitreous degeneration, unspecified eye: Secondary | ICD-10-CM | POA: Insufficient documentation

## 2018-04-14 DIAGNOSIS — M9903 Segmental and somatic dysfunction of lumbar region: Secondary | ICD-10-CM | POA: Diagnosis not present

## 2018-04-14 DIAGNOSIS — M47816 Spondylosis without myelopathy or radiculopathy, lumbar region: Secondary | ICD-10-CM | POA: Diagnosis not present

## 2018-05-08 DIAGNOSIS — M170 Bilateral primary osteoarthritis of knee: Secondary | ICD-10-CM | POA: Diagnosis not present

## 2018-05-11 DIAGNOSIS — H2703 Aphakia, bilateral: Secondary | ICD-10-CM | POA: Diagnosis not present

## 2018-05-11 DIAGNOSIS — H219 Unspecified disorder of iris and ciliary body: Secondary | ICD-10-CM | POA: Diagnosis not present

## 2018-05-11 DIAGNOSIS — H59811 Chorioretinal scars after surgery for detachment, right eye: Secondary | ICD-10-CM | POA: Diagnosis not present

## 2018-05-11 DIAGNOSIS — H5700 Unspecified anomaly of pupillary function: Secondary | ICD-10-CM | POA: Diagnosis not present

## 2018-05-11 DIAGNOSIS — H1851 Endothelial corneal dystrophy: Secondary | ICD-10-CM | POA: Diagnosis not present

## 2018-05-11 DIAGNOSIS — Z961 Presence of intraocular lens: Secondary | ICD-10-CM | POA: Diagnosis not present

## 2018-05-11 DIAGNOSIS — H401233 Low-tension glaucoma, bilateral, severe stage: Secondary | ICD-10-CM | POA: Diagnosis not present

## 2018-05-11 DIAGNOSIS — Z9849 Cataract extraction status, unspecified eye: Secondary | ICD-10-CM | POA: Diagnosis not present

## 2018-05-11 DIAGNOSIS — H43813 Vitreous degeneration, bilateral: Secondary | ICD-10-CM | POA: Diagnosis not present

## 2018-05-11 DIAGNOSIS — H338 Other retinal detachments: Secondary | ICD-10-CM | POA: Diagnosis not present

## 2018-05-11 DIAGNOSIS — H35373 Puckering of macula, bilateral: Secondary | ICD-10-CM | POA: Diagnosis not present

## 2018-05-12 DIAGNOSIS — M9903 Segmental and somatic dysfunction of lumbar region: Secondary | ICD-10-CM | POA: Diagnosis not present

## 2018-05-12 DIAGNOSIS — M47816 Spondylosis without myelopathy or radiculopathy, lumbar region: Secondary | ICD-10-CM | POA: Diagnosis not present

## 2018-05-22 DIAGNOSIS — R739 Hyperglycemia, unspecified: Secondary | ICD-10-CM | POA: Diagnosis not present

## 2018-05-22 DIAGNOSIS — K210 Gastro-esophageal reflux disease with esophagitis: Secondary | ICD-10-CM | POA: Diagnosis not present

## 2018-05-22 DIAGNOSIS — E039 Hypothyroidism, unspecified: Secondary | ICD-10-CM | POA: Diagnosis not present

## 2018-05-22 DIAGNOSIS — Z23 Encounter for immunization: Secondary | ICD-10-CM | POA: Diagnosis not present

## 2018-05-22 DIAGNOSIS — E7800 Pure hypercholesterolemia, unspecified: Secondary | ICD-10-CM | POA: Diagnosis not present

## 2018-05-22 DIAGNOSIS — R5383 Other fatigue: Secondary | ICD-10-CM | POA: Diagnosis not present

## 2018-05-22 DIAGNOSIS — I10 Essential (primary) hypertension: Secondary | ICD-10-CM | POA: Diagnosis not present

## 2018-05-26 DIAGNOSIS — K210 Gastro-esophageal reflux disease with esophagitis: Secondary | ICD-10-CM | POA: Diagnosis not present

## 2018-05-26 DIAGNOSIS — N951 Menopausal and female climacteric states: Secondary | ICD-10-CM | POA: Diagnosis not present

## 2018-05-26 DIAGNOSIS — R7301 Impaired fasting glucose: Secondary | ICD-10-CM | POA: Diagnosis not present

## 2018-05-26 DIAGNOSIS — E039 Hypothyroidism, unspecified: Secondary | ICD-10-CM | POA: Diagnosis not present

## 2018-05-26 DIAGNOSIS — I10 Essential (primary) hypertension: Secondary | ICD-10-CM | POA: Diagnosis not present

## 2018-05-26 DIAGNOSIS — E782 Mixed hyperlipidemia: Secondary | ICD-10-CM | POA: Diagnosis not present

## 2018-05-26 DIAGNOSIS — Z6824 Body mass index (BMI) 24.0-24.9, adult: Secondary | ICD-10-CM | POA: Diagnosis not present

## 2018-05-26 DIAGNOSIS — G3184 Mild cognitive impairment, so stated: Secondary | ICD-10-CM | POA: Diagnosis not present

## 2018-05-29 DIAGNOSIS — Z23 Encounter for immunization: Secondary | ICD-10-CM | POA: Diagnosis not present

## 2018-06-08 DIAGNOSIS — M47816 Spondylosis without myelopathy or radiculopathy, lumbar region: Secondary | ICD-10-CM | POA: Diagnosis not present

## 2018-06-08 DIAGNOSIS — M9903 Segmental and somatic dysfunction of lumbar region: Secondary | ICD-10-CM | POA: Diagnosis not present

## 2018-06-17 DIAGNOSIS — L309 Dermatitis, unspecified: Secondary | ICD-10-CM | POA: Diagnosis not present

## 2018-07-02 DIAGNOSIS — L309 Dermatitis, unspecified: Secondary | ICD-10-CM | POA: Diagnosis not present

## 2018-07-06 DIAGNOSIS — L309 Dermatitis, unspecified: Secondary | ICD-10-CM | POA: Diagnosis not present

## 2018-07-06 DIAGNOSIS — L0100 Impetigo, unspecified: Secondary | ICD-10-CM | POA: Diagnosis not present

## 2018-07-07 DIAGNOSIS — M9903 Segmental and somatic dysfunction of lumbar region: Secondary | ICD-10-CM | POA: Diagnosis not present

## 2018-07-07 DIAGNOSIS — M47816 Spondylosis without myelopathy or radiculopathy, lumbar region: Secondary | ICD-10-CM | POA: Diagnosis not present

## 2018-07-13 DIAGNOSIS — L309 Dermatitis, unspecified: Secondary | ICD-10-CM | POA: Diagnosis not present

## 2018-07-31 DIAGNOSIS — Z1231 Encounter for screening mammogram for malignant neoplasm of breast: Secondary | ICD-10-CM | POA: Diagnosis not present

## 2018-08-05 ENCOUNTER — Ambulatory Visit (INDEPENDENT_AMBULATORY_CARE_PROVIDER_SITE_OTHER): Payer: Medicare Other | Admitting: Urology

## 2018-08-05 DIAGNOSIS — R351 Nocturia: Secondary | ICD-10-CM | POA: Diagnosis not present

## 2018-08-05 DIAGNOSIS — N3021 Other chronic cystitis with hematuria: Secondary | ICD-10-CM

## 2018-08-06 DIAGNOSIS — M8588 Other specified disorders of bone density and structure, other site: Secondary | ICD-10-CM | POA: Diagnosis not present

## 2018-08-11 DIAGNOSIS — M47816 Spondylosis without myelopathy or radiculopathy, lumbar region: Secondary | ICD-10-CM | POA: Diagnosis not present

## 2018-08-11 DIAGNOSIS — M9903 Segmental and somatic dysfunction of lumbar region: Secondary | ICD-10-CM | POA: Diagnosis not present

## 2018-09-08 DIAGNOSIS — M47816 Spondylosis without myelopathy or radiculopathy, lumbar region: Secondary | ICD-10-CM | POA: Diagnosis not present

## 2018-09-08 DIAGNOSIS — M9903 Segmental and somatic dysfunction of lumbar region: Secondary | ICD-10-CM | POA: Diagnosis not present

## 2018-09-23 DIAGNOSIS — H401133 Primary open-angle glaucoma, bilateral, severe stage: Secondary | ICD-10-CM | POA: Diagnosis not present

## 2018-09-23 DIAGNOSIS — H35371 Puckering of macula, right eye: Secondary | ICD-10-CM | POA: Insufficient documentation

## 2018-10-28 DIAGNOSIS — M7551 Bursitis of right shoulder: Secondary | ICD-10-CM | POA: Diagnosis not present

## 2018-10-28 DIAGNOSIS — Z6823 Body mass index (BMI) 23.0-23.9, adult: Secondary | ICD-10-CM | POA: Diagnosis not present

## 2018-10-28 DIAGNOSIS — M19011 Primary osteoarthritis, right shoulder: Secondary | ICD-10-CM | POA: Diagnosis not present

## 2018-11-16 DIAGNOSIS — R7301 Impaired fasting glucose: Secondary | ICD-10-CM | POA: Diagnosis not present

## 2018-11-16 DIAGNOSIS — K210 Gastro-esophageal reflux disease with esophagitis: Secondary | ICD-10-CM | POA: Diagnosis not present

## 2018-11-16 DIAGNOSIS — E039 Hypothyroidism, unspecified: Secondary | ICD-10-CM | POA: Diagnosis not present

## 2018-11-16 DIAGNOSIS — R739 Hyperglycemia, unspecified: Secondary | ICD-10-CM | POA: Diagnosis not present

## 2018-11-16 DIAGNOSIS — I10 Essential (primary) hypertension: Secondary | ICD-10-CM | POA: Diagnosis not present

## 2018-11-16 DIAGNOSIS — E7800 Pure hypercholesterolemia, unspecified: Secondary | ICD-10-CM | POA: Diagnosis not present

## 2018-11-20 DIAGNOSIS — G3184 Mild cognitive impairment, so stated: Secondary | ICD-10-CM | POA: Diagnosis not present

## 2018-11-20 DIAGNOSIS — R7301 Impaired fasting glucose: Secondary | ICD-10-CM | POA: Diagnosis not present

## 2018-11-20 DIAGNOSIS — N951 Menopausal and female climacteric states: Secondary | ICD-10-CM | POA: Diagnosis not present

## 2018-11-20 DIAGNOSIS — E039 Hypothyroidism, unspecified: Secondary | ICD-10-CM | POA: Diagnosis not present

## 2018-11-20 DIAGNOSIS — E782 Mixed hyperlipidemia: Secondary | ICD-10-CM | POA: Diagnosis not present

## 2018-11-20 DIAGNOSIS — I10 Essential (primary) hypertension: Secondary | ICD-10-CM | POA: Diagnosis not present

## 2018-11-20 DIAGNOSIS — K210 Gastro-esophageal reflux disease with esophagitis: Secondary | ICD-10-CM | POA: Diagnosis not present

## 2018-11-20 DIAGNOSIS — Z6822 Body mass index (BMI) 22.0-22.9, adult: Secondary | ICD-10-CM | POA: Diagnosis not present

## 2018-11-30 DIAGNOSIS — M9903 Segmental and somatic dysfunction of lumbar region: Secondary | ICD-10-CM | POA: Diagnosis not present

## 2018-11-30 DIAGNOSIS — M47816 Spondylosis without myelopathy or radiculopathy, lumbar region: Secondary | ICD-10-CM | POA: Diagnosis not present

## 2018-12-29 DIAGNOSIS — M47816 Spondylosis without myelopathy or radiculopathy, lumbar region: Secondary | ICD-10-CM | POA: Diagnosis not present

## 2018-12-29 DIAGNOSIS — M9903 Segmental and somatic dysfunction of lumbar region: Secondary | ICD-10-CM | POA: Diagnosis not present

## 2019-01-26 DIAGNOSIS — M9903 Segmental and somatic dysfunction of lumbar region: Secondary | ICD-10-CM | POA: Diagnosis not present

## 2019-01-26 DIAGNOSIS — M47816 Spondylosis without myelopathy or radiculopathy, lumbar region: Secondary | ICD-10-CM | POA: Diagnosis not present

## 2019-02-03 DIAGNOSIS — H401133 Primary open-angle glaucoma, bilateral, severe stage: Secondary | ICD-10-CM | POA: Diagnosis not present

## 2019-02-03 DIAGNOSIS — H04123 Dry eye syndrome of bilateral lacrimal glands: Secondary | ICD-10-CM | POA: Insufficient documentation

## 2019-02-11 DIAGNOSIS — J342 Deviated nasal septum: Secondary | ICD-10-CM | POA: Diagnosis not present

## 2019-02-11 DIAGNOSIS — J0101 Acute recurrent maxillary sinusitis: Secondary | ICD-10-CM | POA: Diagnosis not present

## 2019-02-12 DIAGNOSIS — M170 Bilateral primary osteoarthritis of knee: Secondary | ICD-10-CM | POA: Diagnosis not present

## 2019-03-03 DIAGNOSIS — Z8669 Personal history of other diseases of the nervous system and sense organs: Secondary | ICD-10-CM | POA: Diagnosis not present

## 2019-03-03 DIAGNOSIS — H401313 Pigmentary glaucoma, right eye, severe stage: Secondary | ICD-10-CM | POA: Diagnosis not present

## 2019-03-03 DIAGNOSIS — H401322 Pigmentary glaucoma, left eye, moderate stage: Secondary | ICD-10-CM | POA: Diagnosis not present

## 2019-03-03 DIAGNOSIS — G43109 Migraine with aura, not intractable, without status migrainosus: Secondary | ICD-10-CM | POA: Diagnosis not present

## 2019-03-11 DIAGNOSIS — R001 Bradycardia, unspecified: Secondary | ICD-10-CM | POA: Diagnosis not present

## 2019-03-11 DIAGNOSIS — Z6823 Body mass index (BMI) 23.0-23.9, adult: Secondary | ICD-10-CM | POA: Diagnosis not present

## 2019-03-11 DIAGNOSIS — R42 Dizziness and giddiness: Secondary | ICD-10-CM | POA: Diagnosis not present

## 2019-03-23 DIAGNOSIS — L570 Actinic keratosis: Secondary | ICD-10-CM | POA: Diagnosis not present

## 2019-03-23 DIAGNOSIS — L219 Seborrheic dermatitis, unspecified: Secondary | ICD-10-CM | POA: Diagnosis not present

## 2019-03-23 DIAGNOSIS — L818 Other specified disorders of pigmentation: Secondary | ICD-10-CM | POA: Diagnosis not present

## 2019-03-24 DIAGNOSIS — M47816 Spondylosis without myelopathy or radiculopathy, lumbar region: Secondary | ICD-10-CM | POA: Diagnosis not present

## 2019-03-24 DIAGNOSIS — M9903 Segmental and somatic dysfunction of lumbar region: Secondary | ICD-10-CM | POA: Diagnosis not present

## 2019-03-24 DIAGNOSIS — M545 Low back pain: Secondary | ICD-10-CM | POA: Diagnosis not present

## 2019-03-26 DIAGNOSIS — M25511 Pain in right shoulder: Secondary | ICD-10-CM | POA: Diagnosis not present

## 2019-04-20 DIAGNOSIS — M47816 Spondylosis without myelopathy or radiculopathy, lumbar region: Secondary | ICD-10-CM | POA: Diagnosis not present

## 2019-04-20 DIAGNOSIS — M9903 Segmental and somatic dysfunction of lumbar region: Secondary | ICD-10-CM | POA: Diagnosis not present

## 2019-04-20 DIAGNOSIS — M545 Low back pain: Secondary | ICD-10-CM | POA: Diagnosis not present

## 2019-04-22 DIAGNOSIS — M76821 Posterior tibial tendinitis, right leg: Secondary | ICD-10-CM | POA: Diagnosis not present

## 2019-04-22 DIAGNOSIS — M79671 Pain in right foot: Secondary | ICD-10-CM | POA: Diagnosis not present

## 2019-04-30 DIAGNOSIS — Z23 Encounter for immunization: Secondary | ICD-10-CM | POA: Diagnosis not present

## 2019-05-20 DIAGNOSIS — M9903 Segmental and somatic dysfunction of lumbar region: Secondary | ICD-10-CM | POA: Diagnosis not present

## 2019-05-20 DIAGNOSIS — M545 Low back pain: Secondary | ICD-10-CM | POA: Diagnosis not present

## 2019-05-20 DIAGNOSIS — M47816 Spondylosis without myelopathy or radiculopathy, lumbar region: Secondary | ICD-10-CM | POA: Diagnosis not present

## 2019-05-24 DIAGNOSIS — M47816 Spondylosis without myelopathy or radiculopathy, lumbar region: Secondary | ICD-10-CM | POA: Diagnosis not present

## 2019-05-24 DIAGNOSIS — M546 Pain in thoracic spine: Secondary | ICD-10-CM | POA: Diagnosis not present

## 2019-05-24 DIAGNOSIS — M4155 Other secondary scoliosis, thoracolumbar region: Secondary | ICD-10-CM | POA: Diagnosis not present

## 2019-05-24 DIAGNOSIS — M549 Dorsalgia, unspecified: Secondary | ICD-10-CM | POA: Diagnosis not present

## 2019-05-24 DIAGNOSIS — M545 Low back pain: Secondary | ICD-10-CM | POA: Diagnosis not present

## 2019-05-24 DIAGNOSIS — M47814 Spondylosis without myelopathy or radiculopathy, thoracic region: Secondary | ICD-10-CM | POA: Diagnosis not present

## 2019-05-25 ENCOUNTER — Other Ambulatory Visit (HOSPITAL_COMMUNITY): Payer: Self-pay | Admitting: Neurosurgery

## 2019-05-25 DIAGNOSIS — M545 Low back pain, unspecified: Secondary | ICD-10-CM

## 2019-05-25 DIAGNOSIS — M546 Pain in thoracic spine: Secondary | ICD-10-CM | POA: Diagnosis not present

## 2019-05-25 DIAGNOSIS — M549 Dorsalgia, unspecified: Secondary | ICD-10-CM | POA: Diagnosis not present

## 2019-05-25 DIAGNOSIS — M419 Scoliosis, unspecified: Secondary | ICD-10-CM | POA: Diagnosis not present

## 2019-05-26 DIAGNOSIS — E782 Mixed hyperlipidemia: Secondary | ICD-10-CM | POA: Diagnosis not present

## 2019-05-26 DIAGNOSIS — E039 Hypothyroidism, unspecified: Secondary | ICD-10-CM | POA: Diagnosis not present

## 2019-05-26 DIAGNOSIS — R5383 Other fatigue: Secondary | ICD-10-CM | POA: Diagnosis not present

## 2019-05-26 DIAGNOSIS — E7800 Pure hypercholesterolemia, unspecified: Secondary | ICD-10-CM | POA: Diagnosis not present

## 2019-05-26 DIAGNOSIS — K2100 Gastro-esophageal reflux disease with esophagitis, without bleeding: Secondary | ICD-10-CM | POA: Diagnosis not present

## 2019-05-26 DIAGNOSIS — I10 Essential (primary) hypertension: Secondary | ICD-10-CM | POA: Diagnosis not present

## 2019-05-27 DIAGNOSIS — M419 Scoliosis, unspecified: Secondary | ICD-10-CM | POA: Diagnosis not present

## 2019-05-27 DIAGNOSIS — M546 Pain in thoracic spine: Secondary | ICD-10-CM | POA: Diagnosis not present

## 2019-05-27 DIAGNOSIS — M549 Dorsalgia, unspecified: Secondary | ICD-10-CM | POA: Diagnosis not present

## 2019-05-28 DIAGNOSIS — I10 Essential (primary) hypertension: Secondary | ICD-10-CM | POA: Diagnosis not present

## 2019-05-28 DIAGNOSIS — R739 Hyperglycemia, unspecified: Secondary | ICD-10-CM | POA: Diagnosis not present

## 2019-05-31 DIAGNOSIS — M546 Pain in thoracic spine: Secondary | ICD-10-CM | POA: Diagnosis not present

## 2019-05-31 DIAGNOSIS — M549 Dorsalgia, unspecified: Secondary | ICD-10-CM | POA: Diagnosis not present

## 2019-05-31 DIAGNOSIS — M419 Scoliosis, unspecified: Secondary | ICD-10-CM | POA: Diagnosis not present

## 2019-06-02 ENCOUNTER — Ambulatory Visit (HOSPITAL_COMMUNITY)
Admission: RE | Admit: 2019-06-02 | Discharge: 2019-06-02 | Disposition: A | Discharge status: Home / Self Care | Payer: Medicare Other | Source: Ambulatory Visit | Attending: Neurosurgery | Admitting: Neurosurgery

## 2019-06-02 ENCOUNTER — Other Ambulatory Visit: Payer: Self-pay

## 2019-06-02 DIAGNOSIS — M545 Low back pain, unspecified: Secondary | ICD-10-CM

## 2019-06-02 DIAGNOSIS — M546 Pain in thoracic spine: Secondary | ICD-10-CM | POA: Diagnosis not present

## 2019-06-03 DIAGNOSIS — M549 Dorsalgia, unspecified: Secondary | ICD-10-CM | POA: Diagnosis not present

## 2019-06-03 DIAGNOSIS — M546 Pain in thoracic spine: Secondary | ICD-10-CM | POA: Diagnosis not present

## 2019-06-03 DIAGNOSIS — L570 Actinic keratosis: Secondary | ICD-10-CM | POA: Diagnosis not present

## 2019-06-03 DIAGNOSIS — L819 Disorder of pigmentation, unspecified: Secondary | ICD-10-CM | POA: Diagnosis not present

## 2019-06-03 DIAGNOSIS — M419 Scoliosis, unspecified: Secondary | ICD-10-CM | POA: Diagnosis not present

## 2019-06-07 DIAGNOSIS — M549 Dorsalgia, unspecified: Secondary | ICD-10-CM | POA: Diagnosis not present

## 2019-06-07 DIAGNOSIS — M419 Scoliosis, unspecified: Secondary | ICD-10-CM | POA: Diagnosis not present

## 2019-06-07 DIAGNOSIS — M546 Pain in thoracic spine: Secondary | ICD-10-CM | POA: Diagnosis not present

## 2019-06-09 DIAGNOSIS — M419 Scoliosis, unspecified: Secondary | ICD-10-CM | POA: Diagnosis not present

## 2019-06-09 DIAGNOSIS — M549 Dorsalgia, unspecified: Secondary | ICD-10-CM | POA: Diagnosis not present

## 2019-06-09 DIAGNOSIS — M546 Pain in thoracic spine: Secondary | ICD-10-CM | POA: Diagnosis not present

## 2019-06-15 DIAGNOSIS — M47814 Spondylosis without myelopathy or radiculopathy, thoracic region: Secondary | ICD-10-CM | POA: Diagnosis not present

## 2019-06-15 DIAGNOSIS — M545 Low back pain: Secondary | ICD-10-CM | POA: Diagnosis not present

## 2019-06-15 DIAGNOSIS — M47816 Spondylosis without myelopathy or radiculopathy, lumbar region: Secondary | ICD-10-CM | POA: Diagnosis not present

## 2019-06-15 DIAGNOSIS — M4155 Other secondary scoliosis, thoracolumbar region: Secondary | ICD-10-CM | POA: Diagnosis not present

## 2019-06-16 DIAGNOSIS — M545 Low back pain: Secondary | ICD-10-CM | POA: Diagnosis not present

## 2019-06-16 DIAGNOSIS — M47816 Spondylosis without myelopathy or radiculopathy, lumbar region: Secondary | ICD-10-CM | POA: Diagnosis not present

## 2019-06-16 DIAGNOSIS — M9903 Segmental and somatic dysfunction of lumbar region: Secondary | ICD-10-CM | POA: Diagnosis not present

## 2019-06-18 DIAGNOSIS — M545 Low back pain: Secondary | ICD-10-CM | POA: Diagnosis not present

## 2019-06-18 DIAGNOSIS — M9903 Segmental and somatic dysfunction of lumbar region: Secondary | ICD-10-CM | POA: Diagnosis not present

## 2019-06-18 DIAGNOSIS — M25511 Pain in right shoulder: Secondary | ICD-10-CM | POA: Diagnosis not present

## 2019-06-18 DIAGNOSIS — M47816 Spondylosis without myelopathy or radiculopathy, lumbar region: Secondary | ICD-10-CM | POA: Diagnosis not present

## 2019-06-21 DIAGNOSIS — M9903 Segmental and somatic dysfunction of lumbar region: Secondary | ICD-10-CM | POA: Diagnosis not present

## 2019-06-21 DIAGNOSIS — M47816 Spondylosis without myelopathy or radiculopathy, lumbar region: Secondary | ICD-10-CM | POA: Diagnosis not present

## 2019-06-21 DIAGNOSIS — M545 Low back pain: Secondary | ICD-10-CM | POA: Diagnosis not present

## 2019-06-28 DIAGNOSIS — E782 Mixed hyperlipidemia: Secondary | ICD-10-CM | POA: Diagnosis not present

## 2019-06-28 DIAGNOSIS — I10 Essential (primary) hypertension: Secondary | ICD-10-CM | POA: Diagnosis not present

## 2019-06-30 DIAGNOSIS — M9903 Segmental and somatic dysfunction of lumbar region: Secondary | ICD-10-CM | POA: Diagnosis not present

## 2019-06-30 DIAGNOSIS — M47816 Spondylosis without myelopathy or radiculopathy, lumbar region: Secondary | ICD-10-CM | POA: Diagnosis not present

## 2019-06-30 DIAGNOSIS — M545 Low back pain: Secondary | ICD-10-CM | POA: Diagnosis not present

## 2019-07-06 DIAGNOSIS — G43109 Migraine with aura, not intractable, without status migrainosus: Secondary | ICD-10-CM | POA: Diagnosis not present

## 2019-07-06 DIAGNOSIS — H401322 Pigmentary glaucoma, left eye, moderate stage: Secondary | ICD-10-CM | POA: Diagnosis not present

## 2019-07-06 DIAGNOSIS — H401313 Pigmentary glaucoma, right eye, severe stage: Secondary | ICD-10-CM | POA: Diagnosis not present

## 2019-07-06 DIAGNOSIS — Z8669 Personal history of other diseases of the nervous system and sense organs: Secondary | ICD-10-CM | POA: Diagnosis not present

## 2019-07-07 DIAGNOSIS — M9903 Segmental and somatic dysfunction of lumbar region: Secondary | ICD-10-CM | POA: Diagnosis not present

## 2019-07-07 DIAGNOSIS — M545 Low back pain: Secondary | ICD-10-CM | POA: Diagnosis not present

## 2019-07-07 DIAGNOSIS — M47816 Spondylosis without myelopathy or radiculopathy, lumbar region: Secondary | ICD-10-CM | POA: Diagnosis not present

## 2019-08-10 DIAGNOSIS — Z23 Encounter for immunization: Secondary | ICD-10-CM | POA: Diagnosis not present

## 2019-08-11 DIAGNOSIS — M47816 Spondylosis without myelopathy or radiculopathy, lumbar region: Secondary | ICD-10-CM | POA: Diagnosis not present

## 2019-08-11 DIAGNOSIS — M545 Low back pain: Secondary | ICD-10-CM | POA: Diagnosis not present

## 2019-08-11 DIAGNOSIS — M9903 Segmental and somatic dysfunction of lumbar region: Secondary | ICD-10-CM | POA: Diagnosis not present

## 2019-08-16 DIAGNOSIS — M25512 Pain in left shoulder: Secondary | ICD-10-CM | POA: Diagnosis not present

## 2019-08-16 DIAGNOSIS — M19012 Primary osteoarthritis, left shoulder: Secondary | ICD-10-CM | POA: Diagnosis not present

## 2019-08-16 DIAGNOSIS — M19011 Primary osteoarthritis, right shoulder: Secondary | ICD-10-CM | POA: Insufficient documentation

## 2019-08-16 DIAGNOSIS — M25511 Pain in right shoulder: Secondary | ICD-10-CM | POA: Diagnosis not present

## 2019-08-19 DIAGNOSIS — Z1231 Encounter for screening mammogram for malignant neoplasm of breast: Secondary | ICD-10-CM | POA: Diagnosis not present

## 2019-08-25 DIAGNOSIS — M545 Low back pain: Secondary | ICD-10-CM | POA: Diagnosis not present

## 2019-08-25 DIAGNOSIS — M47816 Spondylosis without myelopathy or radiculopathy, lumbar region: Secondary | ICD-10-CM | POA: Diagnosis not present

## 2019-08-25 DIAGNOSIS — M9903 Segmental and somatic dysfunction of lumbar region: Secondary | ICD-10-CM | POA: Diagnosis not present

## 2019-08-27 DIAGNOSIS — E039 Hypothyroidism, unspecified: Secondary | ICD-10-CM | POA: Diagnosis not present

## 2019-08-27 DIAGNOSIS — I10 Essential (primary) hypertension: Secondary | ICD-10-CM | POA: Diagnosis not present

## 2019-09-01 ENCOUNTER — Ambulatory Visit (INDEPENDENT_AMBULATORY_CARE_PROVIDER_SITE_OTHER): Payer: Medicare Other | Admitting: Urology

## 2019-09-01 ENCOUNTER — Other Ambulatory Visit: Payer: Self-pay

## 2019-09-01 ENCOUNTER — Encounter: Payer: Self-pay | Admitting: Urology

## 2019-09-01 VITALS — BP 173/71 | HR 66 | Temp 97.9°F | Ht 62.0 in | Wt 124.0 lb

## 2019-09-01 DIAGNOSIS — N3020 Other chronic cystitis without hematuria: Secondary | ICD-10-CM | POA: Insufficient documentation

## 2019-09-01 DIAGNOSIS — N3021 Other chronic cystitis with hematuria: Secondary | ICD-10-CM

## 2019-09-01 DIAGNOSIS — R351 Nocturia: Secondary | ICD-10-CM | POA: Insufficient documentation

## 2019-09-01 LAB — POCT URINALYSIS DIPSTICK
Bilirubin, UA: NEGATIVE
Blood, UA: NEGATIVE
Glucose, UA: NEGATIVE
Ketones, UA: NEGATIVE
Leukocytes, UA: NEGATIVE
Nitrite, UA: NEGATIVE
Protein, UA: NEGATIVE
Spec Grav, UA: <=1.005 — AB (ref 1.010–1.025)
Urobilinogen, UA: NEGATIVE E.U./dL — AB
pH, UA: 7.5 (ref 5.0–8.0)

## 2019-09-01 MED ORDER — NITROFURANTOIN MACROCRYSTAL 50 MG PO CAPS: 50.0000 mg | ORAL_CAPSULE | Freq: Every day | ORAL | 3 refills | Status: DC

## 2019-09-01 NOTE — Progress Notes (Signed)
Urological Symptom Review  Patient is experiencing the following symptoms: none   Review of Systems  Gastrointestinal (upper)  : Indigestion/heartburn  Gastrointestinal (lower) : Negative for lower GI symptoms  Constitutional : Weight loss  Skin: Negative for skin symptoms  Eyes: Negative for eye symptoms  Ear/Nose/Throat : Negative for Ear/Nose/Throat symptoms  Hematologic/Lymphatic: Negative for Hematologic/Lymphatic symptoms  Cardiovascular : Negative for cardiovascular symptoms  Respiratory : Negative for respiratory symptoms  Endocrine: Negative for endocrine symptoms  Musculoskeletal: Back pain  Neurological: Negative for neurological symptoms  Psychologic: Negative for psychiatric symptoms

## 2019-09-01 NOTE — Progress Notes (Signed)
09/01/2019 11:03 AM   Rebekah King May 14, 1941 [id number]  Referring provider: Caryl Bis, MD [place],  [place] [address]  Chronic cystitis  HPI: Rebekah King is a [id number] here for followup for chronic cystitis and nocturia. Nocturia stable at 0-1x on fluid management. She has recurrent UTIS and no UTIs since last visit on nitrofurantoin 50mg  qhs. No new LUTS. No hematuria.    PMH: Past Medical History:  Diagnosis Date  . Anemia   . Arthritis   . Complication of anesthesia    CHLOSTROPHOBIC- TROUBLE WITH ANYTHING OVER FACE  . GERD (gastroesophageal reflux disease)   . Glaucoma   . Hypertension    UP  AND DOWN, NOT CONSISTANTLY HIGH  . Hypothyroidism   . UTI (urinary tract infection)     Surgical History: Past Surgical History:  Procedure Laterality Date  . ABDOMINAL HYSTERECTOMY    . CATARACT EXTRACTION W/ INTRAOCULAR LENS  IMPLANT, BILATERAL    . HIP FRACTURE SURGERY      RIGHT    (2ND SURGERY TO REMOVE PINS)  . REFRACTIVE SURGERY    . RETINAL DETACHMENT SURGERY     RIGHT  . 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   Procedure: TOTAL KNEE ARTHROPLASTY;  Surgeon: Rebekah Server, MD;  Location: [facility];  Service: Orthopedics;  Laterality: Right;    Home Medications:  Allergies as of 09/01/2019      Reactions   Amoxicillin-pot Clavulanate Diarrhea   GI pain Has patient had a PCN reaction causing immediate rash, facial/tongue/throat swelling, SOB or lightheadedness with hypotension: No Has patient had a PCN reaction causing severe rash involving mucus membranes or skin necrosis: No Has patient had a PCN reaction that required hospitalization: No Has patient had a PCN reaction occurring within the last 10 years: No If all of the above answers are "NO", then may proceed with Cephalosporin use.   Sulfa Antibiotics Nausea Only, Rash   Hands go numb      Medication List       Accurate as of September 01, 2019 11:03 AM. If you have any questions, ask your nurse  or doctor.        STOP taking these medications   dorzolamide-timolol 22.3-6.8 MG/ML ophthalmic solution Commonly known as: COSOPT Stopped by: Rebekah Bang, MD     TAKE these medications   Align 4 MG Caps Take 4 mg by mouth daily.   apixaban 2.5 MG Tabs tablet Commonly known as: ELIQUIS Take 1 tablet (2.5 mg total) by mouth 2 (two) times daily.   Calcium 600 600 MG Tabs tablet Generic drug: calcium carbonate Take 600 mg by mouth daily.   cetirizine 10 MG tablet Commonly known as: ZYRTEC Take 10 mg by mouth daily.   dorzolamide 2 % ophthalmic solution Commonly known as: TRUSOPT 1 drop 3 (three) times daily.   estradiol 0.5 MG tablet Commonly known as: ESTRACE Take 0.5 mg by mouth daily.   famotidine 20 MG tablet Commonly known as: PEPCID Take 20 mg by mouth 2 (two) times daily. What changed: Another medication with the same name was removed. Continue taking this medication, and follow the directions you see here. Changed by: Rebekah Bang, MD   FISH OIL-VITAMIN D PO Take 1 capsule by mouth daily. 1200 mg of fish oil 2000 units   latanoprost 0.005 % ophthalmic solution Commonly known as: XALATAN Place 1 drop into both eyes at bedtime.   levothyroxine 50 MCG tablet Commonly known as: SYNTHROID Take 50  mcg by mouth daily before breakfast.   multivitamin with minerals Tabs tablet Take 1 tablet by mouth daily.   mupirocin ointment 2 % Commonly known as: BACTROBAN APPLY TO IN EACH NOSTRIL AREA THREE TIMES DAILY.   nitrofurantoin 50 MG capsule Commonly known as: MACRODANTIN Take 50 mg by mouth at bedtime.   oxyCODONE 5 MG immediate release tablet Commonly known as: Oxy IR/ROXICODONE 1-2 tabs po q4-6hrs prn pain   ranitidine 150 MG tablet Commonly known as: ZANTAC Take 150 mg by mouth 2 (two) times daily.   simvastatin 20 MG tablet Commonly known as: ZOCOR Take 20 mg by mouth every evening.       Allergies:  Allergies  Allergen Reactions    . Amoxicillin-Pot Clavulanate Diarrhea    GI pain Has patient had a PCN reaction causing immediate rash, facial/tongue/throat swelling, SOB or lightheadedness with hypotension: No Has patient had a PCN reaction causing severe rash involving mucus membranes or skin necrosis: No Has patient had a PCN reaction that required hospitalization: No Has patient had a PCN reaction occurring within the last 10 years: No If all of the above answers are "NO", then may proceed with Cephalosporin use.   . Sulfa Antibiotics Nausea Only and Rash    Hands go numb    Family History: No family history on file.  Social History:  reports that she has never smoked. She has never used smokeless tobacco. She reports that she does not drink alcohol or use drugs.  ROS: All other review of systems were reviewed and are negative except what is noted above in HPI  Physical Exam: BP (!) 173/71   Pulse 66   Temp 97.9 F (36.6 C)   Ht 5\' 2"  (1.575 m)   Wt 124 lb (56.2 kg)   BMI 22.68 kg/m   Constitutional:  Alert and oriented, No acute distress. HEENT: [place] AT, moist mucus membranes.  Trachea midline, no masses. Cardiovascular: No clubbing, cyanosis, or edema. Respiratory: Normal respiratory effort, no increased work of breathing. GI: Abdomen is soft, nontender, nondistended, no abdominal masses GU: No CVA tenderness Lymph: No cervical or inguinal lymphadenopathy. Skin: No rashes, bruises or suspicious lesions. Neurologic: Grossly intact, no focal deficits, moving all 4 extremities. Psychiatric: Normal mood and affect.  Laboratory Data: Lab Results  Component Value Date   WBC 12.6 (H) 10/18/2017   HGB 10.6 (L) 10/18/2017   HCT 31.7 (L) 10/18/2017   MCV 92.4 10/18/2017   PLT 226 10/18/2017    Lab Results  Component Value Date   CREATININE 0.77 10/18/2017    No results found for: PSA  No results found for: TESTOSTERONE  No results found for: HGBA1C  Urinalysis    Component Value Date/Time    COLORURINE YELLOW 10/07/2017 1310   APPEARANCEUR CLEAR 10/07/2017 1310   LABSPEC 1.006 10/07/2017 1310   PHURINE 6.0 10/07/2017 1310   GLUCOSEU NEGATIVE 10/07/2017 1310   HGBUR NEGATIVE 10/07/2017 1310   BILIRUBINUR neg 09/01/2019 1100   KETONESUR NEGATIVE 10/07/2017 1310   PROTEINUR Negative 09/01/2019 1100   PROTEINUR NEGATIVE 10/07/2017 1310   UROBILINOGEN negative (A) 09/01/2019 1100   NITRITE neg 09/01/2019 1100   NITRITE NEGATIVE 10/07/2017 1310   LEUKOCYTESUR Negative 09/01/2019 1100    Lab Results  Component Value Date   BACTERIA NONE SEEN 10/07/2017    Pertinent Imaging:  No results found for this or any previous visit. No results found for this or any previous visit. No results found for this or any previous  visit. No results found for this or any previous visit. No results found for this or any previous visit. No results found for this or any previous visit. No results found for this or any previous visit. No results found for this or any previous visit.  Assessment & Plan:    1. Chronic cystitis with hematuria -continue nitrofurantoin 50mg  qhs - POCT urinalysis dipstick  2. Nocturia -continue fluid management   No follow-ups on file.  Rebekah Bang, MD  [facility] [place]

## 2019-09-01 NOTE — Patient Instructions (Signed)
Urinary Tract Infection, Adult A urinary tract infection (UTI) is an infection of any part of the urinary tract. The urinary tract includes:  The kidneys.  The ureters.  The bladder.  The urethra. These organs make, store, and get rid of pee (urine) in the body. What are the causes? This is caused by germs (bacteria) in your genital area. These germs grow and cause swelling (inflammation) of your urinary tract. What increases the risk? You are more likely to develop this condition if:  You have a small, thin tube (catheter) to drain pee.  You cannot control when you pee or poop (incontinence).  You are female, and: ? You use these methods to prevent pregnancy:  A medicine that kills sperm (spermicide).  A device that blocks sperm (diaphragm). ? You have low levels of a female hormone (estrogen). ? You are pregnant.  You have genes that add to your risk.  You are sexually active.  You take antibiotic medicines.  You have trouble peeing because of: ? A prostate that is bigger than normal, if you are female. ? A blockage in the part of your body that drains pee from the bladder (urethra). ? A kidney stone. ? A nerve condition that affects your bladder (neurogenic bladder). ? Not getting enough to drink. ? Not peeing often enough.  You have other conditions, such as: ? Diabetes. ? A weak disease-fighting system (immune system). ? Sickle cell disease. ? Gout. ? Injury of the spine. What are the signs or symptoms? Symptoms of this condition include:  Needing to pee right away (urgently).  Peeing often.  Peeing small amounts often.  Pain or burning when peeing.  Blood in the pee.  Pee that smells bad or not like normal.  Trouble peeing.  Pee that is cloudy.  Fluid coming from the vagina, if you are female.  Pain in the belly or lower back. Other symptoms include:  Throwing up (vomiting).  No urge to eat.  Feeling mixed up (confused).  Being tired  and grouchy (irritable).  A fever.  Watery poop (diarrhea). How is this treated? This condition may be treated with:  Antibiotic medicine.  Other medicines.  Drinking enough water. Follow these instructions at home:  Medicines  Take over-the-counter and prescription medicines only as told by your doctor.  If you were prescribed an antibiotic medicine, take it as told by your doctor. Do not stop taking it even if you start to feel better. General instructions  Make sure you: ? Pee until your bladder is empty. ? Do not hold pee for a long time. ? Empty your bladder after sex. ? Wipe from front to back after pooping if you are a female. Use each tissue one time when you wipe.  Drink enough fluid to keep your pee pale yellow.  Keep all follow-up visits as told by your doctor. This is important. Contact a doctor if:  You do not get better after 1-2 days.  Your symptoms go away and then come back. Get help right away if:  You have very bad back pain.  You have very bad pain in your lower belly.  You have a fever.  You are sick to your stomach (nauseous).  You are throwing up. Summary  A urinary tract infection (UTI) is an infection of any part of the urinary tract.  This condition is caused by germs in your genital area.  There are many risk factors for a UTI. These include having a small, thin   tube to drain pee and not being able to control when you pee or poop.  Treatment includes antibiotic medicines for germs.  Drink enough fluid to keep your pee pale yellow. This information is not intended to replace advice given to you by your health care provider. Make sure you discuss any questions you have with your health care provider. Document Revised: 07/02/2018 Document Reviewed: 01/22/2018 Elsevier Patient Education  2020 [employer] Inc.  

## 2019-09-02 DIAGNOSIS — M76821 Posterior tibial tendinitis, right leg: Secondary | ICD-10-CM | POA: Diagnosis not present

## 2019-09-15 DIAGNOSIS — Z23 Encounter for immunization: Secondary | ICD-10-CM | POA: Diagnosis not present

## 2019-09-22 DIAGNOSIS — M9903 Segmental and somatic dysfunction of lumbar region: Secondary | ICD-10-CM | POA: Diagnosis not present

## 2019-09-22 DIAGNOSIS — M47816 Spondylosis without myelopathy or radiculopathy, lumbar region: Secondary | ICD-10-CM | POA: Diagnosis not present

## 2019-09-22 DIAGNOSIS — M545 Low back pain: Secondary | ICD-10-CM | POA: Diagnosis not present

## 2019-10-06 DIAGNOSIS — M9903 Segmental and somatic dysfunction of lumbar region: Secondary | ICD-10-CM | POA: Diagnosis not present

## 2019-10-06 DIAGNOSIS — M545 Low back pain: Secondary | ICD-10-CM | POA: Diagnosis not present

## 2019-10-06 DIAGNOSIS — M47816 Spondylosis without myelopathy or radiculopathy, lumbar region: Secondary | ICD-10-CM | POA: Diagnosis not present

## 2019-10-11 ENCOUNTER — Other Ambulatory Visit: Payer: Self-pay | Admitting: Orthopedic Surgery

## 2019-10-11 DIAGNOSIS — M19011 Primary osteoarthritis, right shoulder: Secondary | ICD-10-CM | POA: Diagnosis not present

## 2019-10-18 ENCOUNTER — Ambulatory Visit
Admission: RE | Admit: 2019-10-18 | Discharge: 2019-10-18 | Disposition: A | Discharge status: Home / Self Care | Payer: Medicare Other | Source: Ambulatory Visit | Attending: Orthopedic Surgery | Admitting: Orthopedic Surgery

## 2019-10-18 DIAGNOSIS — M19011 Primary osteoarthritis, right shoulder: Secondary | ICD-10-CM | POA: Diagnosis not present

## 2019-10-27 DIAGNOSIS — E7849 Other hyperlipidemia: Secondary | ICD-10-CM | POA: Diagnosis not present

## 2019-10-27 DIAGNOSIS — I10 Essential (primary) hypertension: Secondary | ICD-10-CM | POA: Diagnosis not present

## 2019-11-08 DIAGNOSIS — M545 Low back pain: Secondary | ICD-10-CM | POA: Diagnosis not present

## 2019-11-08 DIAGNOSIS — M47816 Spondylosis without myelopathy or radiculopathy, lumbar region: Secondary | ICD-10-CM | POA: Diagnosis not present

## 2019-11-08 DIAGNOSIS — M9903 Segmental and somatic dysfunction of lumbar region: Secondary | ICD-10-CM | POA: Diagnosis not present

## 2019-11-10 DIAGNOSIS — M19011 Primary osteoarthritis, right shoulder: Secondary | ICD-10-CM | POA: Diagnosis not present

## 2019-11-11 DIAGNOSIS — E7800 Pure hypercholesterolemia, unspecified: Secondary | ICD-10-CM | POA: Diagnosis not present

## 2019-11-11 DIAGNOSIS — I10 Essential (primary) hypertension: Secondary | ICD-10-CM | POA: Diagnosis not present

## 2019-11-11 DIAGNOSIS — E039 Hypothyroidism, unspecified: Secondary | ICD-10-CM | POA: Diagnosis not present

## 2019-11-11 DIAGNOSIS — K2100 Gastro-esophageal reflux disease with esophagitis, without bleeding: Secondary | ICD-10-CM | POA: Diagnosis not present

## 2019-11-11 DIAGNOSIS — E782 Mixed hyperlipidemia: Secondary | ICD-10-CM | POA: Diagnosis not present

## 2019-11-11 DIAGNOSIS — R7301 Impaired fasting glucose: Secondary | ICD-10-CM | POA: Diagnosis not present

## 2019-11-11 DIAGNOSIS — R739 Hyperglycemia, unspecified: Secondary | ICD-10-CM | POA: Diagnosis not present

## 2019-11-15 DIAGNOSIS — N951 Menopausal and female climacteric states: Secondary | ICD-10-CM | POA: Diagnosis not present

## 2019-11-15 DIAGNOSIS — E782 Mixed hyperlipidemia: Secondary | ICD-10-CM | POA: Diagnosis not present

## 2019-11-15 DIAGNOSIS — M419 Scoliosis, unspecified: Secondary | ICD-10-CM | POA: Diagnosis not present

## 2019-11-15 DIAGNOSIS — R7301 Impaired fasting glucose: Secondary | ICD-10-CM | POA: Diagnosis not present

## 2019-11-15 DIAGNOSIS — I10 Essential (primary) hypertension: Secondary | ICD-10-CM | POA: Diagnosis not present

## 2019-11-15 DIAGNOSIS — G3184 Mild cognitive impairment, so stated: Secondary | ICD-10-CM | POA: Diagnosis not present

## 2019-11-15 DIAGNOSIS — E039 Hypothyroidism, unspecified: Secondary | ICD-10-CM | POA: Diagnosis not present

## 2019-11-16 NOTE — Patient Instructions (Addendum)
DUE TO COVID-19 ONLY ONE VISITOR IS ALLOWED TO COME WITH YOU AND STAY IN THE WAITING ROOM ONLY DURING PRE OP AND PROCEDURE DAY OF SURGERY. THE 2 VISITORS MAY VISIT WITH YOU AFTER SURGERY IN YOUR PRIVATE ROOM DURING VISITING HOURS ONLY!  YOU NEED TO HAVE A COVID 19 TEST ON__4/26_____ @__10 :30_____, THIS TEST MUST BE DONE BEFORE SURGERY, COME  [address], [place] , [id number].  ([place]) ONCE YOUR COVID TEST IS COMPLETED, PLEASE BEGIN THE QUARANTINE INSTRUCTIONS AS OUTLINED IN YOUR HANDOUT.                Rebekah King    Your procedure is scheduled on: 11/25/19   Report to [facility] Main  Entrance   Report to Short Stay  5:30 AM     Call this number if you have problems the morning of surgery [place], NO CHEWING GUM CANDY OR MINTS.   Do not eat food After Midnight.   YOU MAY HAVE CLEAR LIQUIDS FROM MIDNIGHT UNTIL 4:30 AM  . At 4:30 AM Please finish the prescribed Pre-Surgery  drink  . Nothing by mouth after you finish the  drink !   Take these medicines the morning of surgery with A SIP OF WATER: Esterace, Levothyroxine,pepcid, eye drops                                 You may not have any metal on your body including hair pins and              piercings  Do not wear jewelry, make-up, lotions, powders or perfumes, deodorant             Do not wear nail polish on your fingernails.  Do not shave  48 hours prior to surgery.                Do not bring valuables to the hospital. Rebekah King.  Contacts, dentures or bridgework may not be worn into surgery.      Patients discharged the day of surgery will not be allowed to drive home.   IF YOU ARE HAVING SURGERY AND GOING HOME THE SAME DAY, YOU MUST HAVE AN ADULT TO DRIVE YOU HOME AND BE WITH YOU FOR 24 HOURS.   YOU MAY GO HOME BY TAXI OR UBER OR ORTHERWISE, BUT AN ADULT MUST  ACCOMPANY YOU HOME AND STAY WITH YOU FOR 24 HOURS.  Name and phone number of your driver:  Special Instructions: N/A              Please read over the following fact sheets you were given: _____________________________________________________________________  [facility]- Preparing for Total Shoulder Arthroplasty    Before surgery, you can play an important role. Because skin is not sterile, your skin needs to be as free of germs as possible. You can reduce the number of germs on your skin by using the following products. . Benzoyl Peroxide Gel o Reduces the number of germs present on the skin o Applied twice a day to shoulder area starting two days before surgery    ==================================================================  Please follow these instructions carefully:  BENZOYL PEROXIDE 5% GEL  Please do not use if you have  an allergy to benzoyl peroxide.   If your skin becomes reddened/irritated stop using the benzoyl peroxide.  Starting two days before surgery, apply as follows: 1. Apply benzoyl peroxide in the morning and at night. Apply after taking a shower. If you are not taking a shower clean entire shoulder front, back, and side along with the armpit with a clean wet washcloth.  2. Place a quarter-sized dollop on your shoulder and rub in thoroughly, making sure to cover the front, back, and side of your shoulder, along with the armpit.   2 days before ____ AM   ____ PM              1 day before ____ AM   ____ PM                         3. Do this twice a day for two days.  (Last application is the night before surgery, AFTER using the CHG soap as described below).  4. Do NOT apply benzoyl peroxide gel on the day of surgery.            Rebekah King - Preparing for Surgery Before surgery, you can play an important role.   Because skin is not sterile, your skin needs to be as free of germs as possible .  You can reduce the number of germs on your skin by washing with  CHG (chlorahexidine gluconate) soap before surgery.    CHG is an antiseptic cleaner which kills germs and bonds with the skin to continue killing germs even after washing. Please DO NOT use if you have an allergy to CHG or antibacterial soaps.   If your skin becomes reddened/irritated stop using the CHG and inform your nurse when you arrive at Short Stay. Do not shave (including legs and underarms) for at least 48 hours prior to the first CHG shower.   Please follow these instructions carefully:  1.  Shower with CHG Soap the night before surgery and the  morning of Surgery.  2.  If you choose to wash your hair, wash your hair first as usual with your  normal  shampoo.  3.  After you shampoo, rinse your hair and body thoroughly to remove the  shampoo.                                        4.  Use CHG as you would any other liquid soap.  You can apply chg directly  to the skin and wash                       Gently with a scrungie or clean washcloth.  5.  Apply the CHG Soap to your body ONLY FROM THE NECK DOWN.   Do not use on face/ open                           Wound or open sores. Avoid contact with eyes, ears mouth and genitals (private parts).                       Wash face,  Genitals (private parts) with your normal soap.             6.  Wash thoroughly, paying special attention to the area  where your surgery  will be performed.  7.  Thoroughly rinse your body with warm water from the neck down.  8.  DO NOT shower/wash with your normal soap after using and rinsing off  the CHG Soap.             9.  Pat yourself dry with a clean towel.            10.  Wear clean pajamas.            11.  Place clean sheets on your bed the night of your first shower and do not  sleep with pets. Day of Surgery : Do not apply any lotions/deodorants the morning of surgery.  Please wear clean clothes to the hospital/surgery center.  FAILURE TO FOLLOW THESE INSTRUCTIONS MAY RESULT IN THE CANCELLATION OF YOUR  SURGERY PATIENT SIGNATURE_________________________________  NURSE SIGNATURE__________________________________  ________________________________________________________________________   Rebekah King  An incentive spirometer is a tool that can help keep your lungs clear and active. This tool measures how well you are filling your lungs with each breath. Taking long deep breaths may help reverse or decrease the chance of developing breathing (pulmonary) problems (especially infection) following:  A long period of time when you are unable to move or be active. BEFORE THE PROCEDURE   If the spirometer includes an indicator to show your best effort, your nurse or respiratory therapist will set it to a desired goal.  If possible, sit up straight or lean slightly forward. Try not to slouch.  Hold the incentive spirometer in an upright position. INSTRUCTIONS FOR USE  1. Sit on the edge of your bed if possible, or sit up as far as you can in bed or on a chair. 2. Hold the incentive spirometer in an upright position. 3. Breathe out normally. 4. Place the mouthpiece in your mouth and seal your lips tightly around it. 5. Breathe in slowly and as deeply as possible, raising the piston or the ball toward the top of the column. 6. Hold your breath for 3-5 seconds or for as long as possible. Allow the piston or ball to fall to the bottom of the column. 7. Remove the mouthpiece from your mouth and breathe out normally. 8. Rest for a few seconds and repeat Steps 1 through 7 at least 10 times every 1-2 hours when you are awake. Take your time and take a few normal breaths between deep breaths. 9. The spirometer may include an indicator to show your best effort. Use the indicator as a goal to work toward during each repetition. 10. After each set of 10 deep breaths, practice coughing to be sure your lungs are clear. If you have an incision (the cut made at the time of surgery), support your  incision when coughing by placing a pillow or rolled up towels firmly against it. Once you are able to get out of bed, walk around indoors and cough well. You may stop using the incentive spirometer when instructed by your caregiver.  RISKS AND COMPLICATIONS  Take your time so you do not get dizzy or light-headed.  If you are in pain, you may need to take or ask for pain medication before doing incentive spirometry. It is harder to take a deep breath if you are having pain. AFTER USE  Rest and breathe slowly and easily.  It can be helpful to keep track of a log of your progress. Your caregiver can provide you with a simple table to help  with this. If you are using the spirometer at home, follow these instructions: [place] IF:   You are having difficultly using the spirometer.  You have trouble using the spirometer as often as instructed.  Your pain medication is not giving enough relief while using the spirometer.  You develop fever of 100.5 F (38.1 C) or higher. SEEK IMMEDIATE MEDICAL CARE IF:   You cough up bloody sputum that had not been present before.  You develop fever of 102 F (38.9 C) or greater.  You develop worsening pain at or near the incision site. MAKE SURE YOU:   Understand these instructions.  Will watch your condition.  Will get help right away if you are not doing well or get worse. Document Released: 11/25/2006 Document Revised: 10/07/2011 Document Reviewed: 01/26/2007 [facility] Patient Information 2014 [place], [place].   ________________________________________________________________________

## 2019-11-17 DIAGNOSIS — H401322 Pigmentary glaucoma, left eye, moderate stage: Secondary | ICD-10-CM | POA: Diagnosis not present

## 2019-11-17 DIAGNOSIS — H401313 Pigmentary glaucoma, right eye, severe stage: Secondary | ICD-10-CM | POA: Diagnosis not present

## 2019-11-17 DIAGNOSIS — G43109 Migraine with aura, not intractable, without status migrainosus: Secondary | ICD-10-CM | POA: Diagnosis not present

## 2019-11-17 DIAGNOSIS — Z8669 Personal history of other diseases of the nervous system and sense organs: Secondary | ICD-10-CM | POA: Diagnosis not present

## 2019-11-18 ENCOUNTER — Encounter (HOSPITAL_COMMUNITY)
Admission: RE | Admit: 2019-11-18 | Discharge: 2019-11-18 | Disposition: A | Discharge status: Home / Self Care | Payer: Medicare Other | Source: Ambulatory Visit | Attending: Orthopedic Surgery | Admitting: Orthopedic Surgery

## 2019-11-18 ENCOUNTER — Encounter (HOSPITAL_COMMUNITY): Payer: Self-pay

## 2019-11-18 ENCOUNTER — Other Ambulatory Visit: Payer: Self-pay

## 2019-11-18 DIAGNOSIS — I10 Essential (primary) hypertension: Secondary | ICD-10-CM | POA: Diagnosis not present

## 2019-11-18 DIAGNOSIS — Z01818 Encounter for other preprocedural examination: Secondary | ICD-10-CM | POA: Insufficient documentation

## 2019-11-18 LAB — BASIC METABOLIC PANEL
Anion gap: 7 (ref 5–15)
BUN: 18 mg/dL (ref 8–23)
CO2: 25 mmol/L (ref 22–32)
Calcium: 9.4 mg/dL (ref 8.9–10.3)
Chloride: 105 mmol/L (ref 98–111)
Creatinine, Ser: 0.91 mg/dL (ref 0.44–1.00)
GFR calc Af Amer: >60 mL/min (ref >60)
GFR calc non Af Amer: >60 mL/min (ref >60)
Glucose, Bld: 96 mg/dL (ref 70–99)
Potassium: 4 mmol/L (ref 3.5–5.1)
Sodium: 137 mmol/L (ref 135–145)

## 2019-11-18 LAB — CBC
HCT: 35.5 % — ABNORMAL LOW (ref 36.0–46.0)
Hemoglobin: 11.8 g/dL — ABNORMAL LOW (ref 12.0–15.0)
MCH: 32.2 pg (ref 26.0–34.0)
MCHC: 33.2 g/dL (ref 30.0–36.0)
MCV: 96.7 fL (ref 80.0–100.0)
Platelets: 209 10*3/uL (ref 150–400)
RBC: 3.67 MIL/uL — ABNORMAL LOW (ref 3.87–5.11)
RDW: 12.4 % (ref 11.5–15.5)
WBC: 7.1 10*3/uL (ref 4.0–10.5)
nRBC: 0 % (ref 0.0–0.2)

## 2019-11-18 LAB — SURGICAL PCR SCREEN
MRSA, PCR: NEGATIVE
Staphylococcus aureus: NEGATIVE

## 2019-11-18 NOTE — Progress Notes (Signed)
PCP - Dr. Dub Amis Cardiologist - none  Chest x-ray - no EKG - 11/18/19 Stress Test - no ECHO - no Cardiac Cath - no  Sleep Study - no CPAP -   Fasting Blood Sugar - NA Checks Blood Sugar _____ times a day  Blood Thinner Instructions:ASA Aspirin Instructions:none given but pt knows to stop them 5 days prior to DOS Last Dose:4/23  Anesthesia review:   Patient denies shortness of breath, fever, cough and chest pain at PAT appointment yes  Patient verbalized understanding of instructions that were given to them at the PAT appointment. Patient was also instructed that they will need to review over the PAT instructions again at home before surgery. yes

## 2019-11-22 ENCOUNTER — Other Ambulatory Visit: Payer: Self-pay

## 2019-11-22 ENCOUNTER — Other Ambulatory Visit (HOSPITAL_COMMUNITY)
Admission: RE | Admit: 2019-11-22 | Discharge: 2019-11-22 | Disposition: A | Discharge status: Home / Self Care | Payer: Medicare Other | Source: Ambulatory Visit | Attending: Orthopedic Surgery | Admitting: Orthopedic Surgery

## 2019-11-22 ENCOUNTER — Other Ambulatory Visit (HOSPITAL_COMMUNITY): Payer: Medicare Other

## 2019-11-22 DIAGNOSIS — Z01812 Encounter for preprocedural laboratory examination: Secondary | ICD-10-CM | POA: Insufficient documentation

## 2019-11-22 DIAGNOSIS — Z20822 Contact with and (suspected) exposure to covid-19: Secondary | ICD-10-CM | POA: Insufficient documentation

## 2019-11-22 LAB — SARS CORONAVIRUS 2 (TAT 6-24 HRS): SARS Coronavirus 2: NEGATIVE

## 2019-11-25 ENCOUNTER — Other Ambulatory Visit: Payer: Self-pay

## 2019-11-25 ENCOUNTER — Ambulatory Visit (HOSPITAL_COMMUNITY)
Admission: RE | Admit: 2019-11-25 | Discharge: 2019-11-25 | Disposition: A | Discharge status: Home / Self Care | Payer: Medicare Other | Source: Ambulatory Visit | Attending: Orthopedic Surgery | Admitting: Orthopedic Surgery

## 2019-11-25 ENCOUNTER — Encounter (HOSPITAL_COMMUNITY)
Admission: RE | Disposition: A | Discharge status: Home / Self Care | Payer: Self-pay | Source: Ambulatory Visit | Attending: Orthopedic Surgery

## 2019-11-25 ENCOUNTER — Ambulatory Visit (HOSPITAL_COMMUNITY): Payer: Medicare Other | Admitting: Certified Registered Nurse Anesthetist

## 2019-11-25 ENCOUNTER — Encounter (HOSPITAL_COMMUNITY): Payer: Self-pay | Admitting: Orthopedic Surgery

## 2019-11-25 DIAGNOSIS — Z79899 Other long term (current) drug therapy: Secondary | ICD-10-CM | POA: Insufficient documentation

## 2019-11-25 DIAGNOSIS — M19011 Primary osteoarthritis, right shoulder: Secondary | ICD-10-CM | POA: Insufficient documentation

## 2019-11-25 DIAGNOSIS — Z79890 Hormone replacement therapy (postmenopausal): Secondary | ICD-10-CM | POA: Insufficient documentation

## 2019-11-25 DIAGNOSIS — Z96611 Presence of right artificial shoulder joint: Secondary | ICD-10-CM

## 2019-11-25 DIAGNOSIS — Z96651 Presence of right artificial knee joint: Secondary | ICD-10-CM | POA: Insufficient documentation

## 2019-11-25 DIAGNOSIS — H409 Unspecified glaucoma: Secondary | ICD-10-CM | POA: Insufficient documentation

## 2019-11-25 DIAGNOSIS — E039 Hypothyroidism, unspecified: Secondary | ICD-10-CM | POA: Insufficient documentation

## 2019-11-25 DIAGNOSIS — K219 Gastro-esophageal reflux disease without esophagitis: Secondary | ICD-10-CM | POA: Insufficient documentation

## 2019-11-25 DIAGNOSIS — G8918 Other acute postprocedural pain: Secondary | ICD-10-CM | POA: Diagnosis not present

## 2019-11-25 DIAGNOSIS — Z7982 Long term (current) use of aspirin: Secondary | ICD-10-CM | POA: Diagnosis not present

## 2019-11-25 DIAGNOSIS — Z9889 Other specified postprocedural states: Secondary | ICD-10-CM | POA: Insufficient documentation

## 2019-11-25 HISTORY — PX: TOTAL SHOULDER ARTHROPLASTY: SHX126

## 2019-11-25 SURGERY — ARTHROPLASTY, SHOULDER, TOTAL
Anesthesia: General | Site: Shoulder | Laterality: Right

## 2019-11-25 MED ORDER — PHENYLEPHRINE HCL (PRESSORS) 10 MG/ML IV SOLN
INTRAVENOUS | Status: AC
  Filled 2019-11-25: qty 1

## 2019-11-25 MED ORDER — OXYCODONE HCL 5 MG PO TABS: 10.0000 mg | ORAL_TABLET | ORAL | Status: DC | PRN

## 2019-11-25 MED ORDER — ACETAMINOPHEN 325 MG PO TABS: 325.0000 mg | ORAL_TABLET | Freq: Once | ORAL | Status: DC | PRN

## 2019-11-25 MED ORDER — PHENYLEPHRINE HCL-NACL 10-0.9 MG/250ML-% IV SOLN
INTRAVENOUS | Status: DC | PRN
Start: 2019-11-25 — End: 2019-11-25
  Administered 2019-11-25: 30 ug/min via INTRAVENOUS

## 2019-11-25 MED ORDER — MENTHOL 3 MG MT LOZG: 1.0000 | LOZENGE | OROMUCOSAL | Status: DC | PRN

## 2019-11-25 MED ORDER — CHLORHEXIDINE GLUCONATE 4 % EX LIQD: 60.0000 mL | Freq: Once | CUTANEOUS | Status: DC

## 2019-11-25 MED ORDER — ONDANSETRON HCL 4 MG PO TABS: 4.0000 mg | ORAL_TABLET | Freq: Three times a day (TID) | ORAL | 0 refills | Status: DC | PRN

## 2019-11-25 MED ORDER — LACTATED RINGERS IV SOLN: INTRAVENOUS | Status: DC

## 2019-11-25 MED ORDER — SODIUM CHLORIDE 0.9 % IR SOLN
Status: DC | PRN
  Administered 2019-11-25: 1000 mL

## 2019-11-25 MED ORDER — LEVOTHYROXINE SODIUM 50 MCG PO TABS: 50.0000 ug | ORAL_TABLET | Freq: Every day | ORAL | Status: DC

## 2019-11-25 MED ORDER — BISACODYL 5 MG PO TBEC: 5.0000 mg | DELAYED_RELEASE_TABLET | Freq: Every day | ORAL | Status: DC | PRN

## 2019-11-25 MED ORDER — LIDOCAINE 2% (20 MG/ML) 5 ML SYRINGE
INTRAMUSCULAR | Status: DC | PRN
  Administered 2019-11-25: 10 mg via INTRAVENOUS

## 2019-11-25 MED ORDER — ASPIRIN EC 81 MG PO TBEC
81.0000 mg | DELAYED_RELEASE_TABLET | Freq: Every day | ORAL | Status: DC
  Administered 2019-11-25: 81 mg via ORAL
  Filled 2019-11-25: qty 1

## 2019-11-25 MED ORDER — DEXAMETHASONE SODIUM PHOSPHATE 10 MG/ML IJ SOLN
INTRAMUSCULAR | Status: AC
  Filled 2019-11-25: qty 1

## 2019-11-25 MED ORDER — PHENYLEPHRINE 40 MCG/ML (10ML) SYRINGE FOR IV PUSH (FOR BLOOD PRESSURE SUPPORT)
PREFILLED_SYRINGE | INTRAVENOUS | Status: DC | PRN
  Administered 2019-11-25 (×2): 80 ug via INTRAVENOUS

## 2019-11-25 MED ORDER — FENTANYL CITRATE (PF) 100 MCG/2ML IJ SOLN: 25.0000 ug | INTRAMUSCULAR | Status: DC | PRN

## 2019-11-25 MED ORDER — FAMOTIDINE 20 MG PO TABS: 20.0000 mg | ORAL_TABLET | Freq: Two times a day (BID) | ORAL | Status: DC

## 2019-11-25 MED ORDER — ONDANSETRON HCL 4 MG/2ML IJ SOLN
INTRAMUSCULAR | Status: DC | PRN
  Administered 2019-11-25: 4 mg via INTRAVENOUS

## 2019-11-25 MED ORDER — CEFAZOLIN SODIUM-DEXTROSE 2-4 GM/100ML-% IV SOLN
2.0000 g | INTRAVENOUS | Status: AC
  Administered 2019-11-25: 2 g via INTRAVENOUS
  Filled 2019-11-25: qty 100

## 2019-11-25 MED ORDER — CYCLOBENZAPRINE HCL 10 MG PO TABS
10.0000 mg | ORAL_TABLET | Freq: Three times a day (TID) | ORAL | 1 refills | Status: DC | PRN
Start: 2019-11-25 — End: 2020-10-23

## 2019-11-25 MED ORDER — METOCLOPRAMIDE HCL 5 MG PO TABS: 5.0000 mg | ORAL_TABLET | Freq: Three times a day (TID) | ORAL | Status: DC | PRN

## 2019-11-25 MED ORDER — FENTANYL CITRATE (PF) 100 MCG/2ML IJ SOLN
INTRAMUSCULAR | Status: AC
  Filled 2019-11-25: qty 2

## 2019-11-25 MED ORDER — METHOCARBAMOL 500 MG IVPB - SIMPLE MED
500.0000 mg | Freq: Four times a day (QID) | INTRAVENOUS | Status: DC | PRN
  Filled 2019-11-25: qty 50

## 2019-11-25 MED ORDER — ACETAMINOPHEN 160 MG/5ML PO SOLN: 325.0000 mg | Freq: Once | ORAL | Status: DC | PRN

## 2019-11-25 MED ORDER — POLYETHYLENE GLYCOL 3350 17 G PO PACK: 17.0000 g | PACK | Freq: Every day | ORAL | Status: DC | PRN

## 2019-11-25 MED ORDER — HYDROMORPHONE HCL 1 MG/ML IJ SOLN: 0.5000 mg | INTRAMUSCULAR | Status: DC | PRN

## 2019-11-25 MED ORDER — FENTANYL CITRATE (PF) 100 MCG/2ML IJ SOLN
INTRAMUSCULAR | Status: DC | PRN
  Administered 2019-11-25 (×2): 50 ug via INTRAVENOUS

## 2019-11-25 MED ORDER — PROPOFOL 10 MG/ML IV BOLUS
INTRAVENOUS | Status: DC | PRN
  Administered 2019-11-25: 80 mg via INTRAVENOUS

## 2019-11-25 MED ORDER — PANTOPRAZOLE SODIUM 40 MG PO TBEC
40.0000 mg | DELAYED_RELEASE_TABLET | Freq: Every day | ORAL | Status: DC
  Administered 2019-11-25: 40 mg via ORAL
  Filled 2019-11-25: qty 1

## 2019-11-25 MED ORDER — BUPIVACAINE HCL 0.5 % IJ SOLN
INTRAMUSCULAR | Status: DC | PRN
Start: 2019-11-25 — End: 2019-11-25
  Administered 2019-11-25: 15 mL

## 2019-11-25 MED ORDER — ONDANSETRON HCL 4 MG/2ML IJ SOLN
INTRAMUSCULAR | Status: AC
  Filled 2019-11-25: qty 2

## 2019-11-25 MED ORDER — FLEET ENEMA 7-19 GM/118ML RE ENEM: 1.0000 | ENEMA | Freq: Once | RECTAL | Status: DC | PRN

## 2019-11-25 MED ORDER — ROCURONIUM BROMIDE 50 MG/5ML IV SOSY
PREFILLED_SYRINGE | INTRAVENOUS | Status: DC | PRN
  Administered 2019-11-25: 50 mg via INTRAVENOUS
  Administered 2019-11-25: 5 mg via INTRAVENOUS

## 2019-11-25 MED ORDER — STERILE WATER FOR IRRIGATION IR SOLN
Status: DC | PRN
  Administered 2019-11-25 (×2): 1000 mL

## 2019-11-25 MED ORDER — PHENOL 1.4 % MT LIQD: 1.0000 | OROMUCOSAL | Status: DC | PRN

## 2019-11-25 MED ORDER — KETOROLAC TROMETHAMINE 15 MG/ML IJ SOLN
7.5000 mg | Freq: Four times a day (QID) | INTRAMUSCULAR | Status: DC
  Administered 2019-11-25: 7.5 mg via INTRAVENOUS
  Filled 2019-11-25: qty 1

## 2019-11-25 MED ORDER — NITROFURANTOIN MACROCRYSTAL 50 MG PO CAPS
50.0000 mg | ORAL_CAPSULE | Freq: Every day | ORAL | Status: DC
  Filled 2019-11-25: qty 1

## 2019-11-25 MED ORDER — MEPERIDINE HCL 50 MG/ML IJ SOLN: 6.2500 mg | INTRAMUSCULAR | Status: DC | PRN

## 2019-11-25 MED ORDER — ACETAMINOPHEN 10 MG/ML IV SOLN: 1000.0000 mg | Freq: Once | INTRAVENOUS | Status: DC | PRN

## 2019-11-25 MED ORDER — SUGAMMADEX SODIUM 200 MG/2ML IV SOLN
INTRAVENOUS | Status: DC | PRN
  Administered 2019-11-25: 100 mg via INTRAVENOUS

## 2019-11-25 MED ORDER — MIDAZOLAM HCL 2 MG/2ML IJ SOLN
INTRAMUSCULAR | Status: AC
  Filled 2019-11-25: qty 2

## 2019-11-25 MED ORDER — LIDOCAINE 2% (20 MG/ML) 5 ML SYRINGE
INTRAMUSCULAR | Status: AC
  Filled 2019-11-25: qty 5

## 2019-11-25 MED ORDER — PROPOFOL 10 MG/ML IV BOLUS
INTRAVENOUS | Status: AC
  Filled 2019-11-25: qty 20

## 2019-11-25 MED ORDER — OXYCODONE-ACETAMINOPHEN 5-325 MG PO TABS: 1.0000 | ORAL_TABLET | ORAL | 0 refills | Status: DC | PRN

## 2019-11-25 MED ORDER — OXYCODONE HCL 5 MG PO TABS: 5.0000 mg | ORAL_TABLET | ORAL | Status: DC | PRN

## 2019-11-25 MED ORDER — ONDANSETRON HCL 4 MG/2ML IJ SOLN: 4.0000 mg | Freq: Four times a day (QID) | INTRAMUSCULAR | Status: DC | PRN

## 2019-11-25 MED ORDER — METHOCARBAMOL 500 MG PO TABS: 500.0000 mg | ORAL_TABLET | Freq: Four times a day (QID) | ORAL | Status: DC | PRN

## 2019-11-25 MED ORDER — DEXAMETHASONE SODIUM PHOSPHATE 10 MG/ML IJ SOLN
INTRAMUSCULAR | Status: DC | PRN
  Administered 2019-11-25: 5 mg via INTRAVENOUS

## 2019-11-25 MED ORDER — MIDAZOLAM HCL 5 MG/5ML IJ SOLN
INTRAMUSCULAR | Status: DC | PRN
  Administered 2019-11-25: 1 mg via INTRAVENOUS

## 2019-11-25 MED ORDER — ACETAMINOPHEN 325 MG PO TABS: 325.0000 mg | ORAL_TABLET | Freq: Four times a day (QID) | ORAL | Status: DC | PRN

## 2019-11-25 MED ORDER — EPHEDRINE SULFATE-NACL 50-0.9 MG/10ML-% IV SOSY
PREFILLED_SYRINGE | INTRAVENOUS | Status: DC | PRN
  Administered 2019-11-25: 5 mg via INTRAVENOUS

## 2019-11-25 MED ORDER — METOCLOPRAMIDE HCL 5 MG/ML IJ SOLN: 5.0000 mg | Freq: Three times a day (TID) | INTRAMUSCULAR | Status: DC | PRN

## 2019-11-25 MED ORDER — DOCUSATE SODIUM 100 MG PO CAPS: 100.0000 mg | ORAL_CAPSULE | Freq: Two times a day (BID) | ORAL | Status: DC

## 2019-11-25 MED ORDER — ONDANSETRON HCL 4 MG PO TABS: 4.0000 mg | ORAL_TABLET | Freq: Four times a day (QID) | ORAL | Status: DC | PRN

## 2019-11-25 MED ORDER — BUPIVACAINE LIPOSOME 1.3 % IJ SUSP
INTRAMUSCULAR | Status: DC | PRN
  Administered 2019-11-25: 10 mL

## 2019-11-25 MED ORDER — PROMETHAZINE HCL 25 MG/ML IJ SOLN: 6.2500 mg | INTRAMUSCULAR | Status: DC | PRN

## 2019-11-25 MED ORDER — ROCURONIUM BROMIDE 10 MG/ML (PF) SYRINGE
PREFILLED_SYRINGE | INTRAVENOUS | Status: AC
  Filled 2019-11-25: qty 10

## 2019-11-25 MED ORDER — DIPHENHYDRAMINE HCL 12.5 MG/5ML PO ELIX: 12.5000 mg | ORAL_SOLUTION | ORAL | Status: DC | PRN

## 2019-11-25 MED ORDER — ALUM & MAG HYDROXIDE-SIMETH 200-200-20 MG/5ML PO SUSP: 30.0000 mL | ORAL | Status: DC | PRN

## 2019-11-25 MED ORDER — TRANEXAMIC ACID-NACL 1000-0.7 MG/100ML-% IV SOLN
1000.0000 mg | INTRAVENOUS | Status: AC
  Administered 2019-11-25: 1000 mg via INTRAVENOUS
  Filled 2019-11-25: qty 100

## 2019-11-25 SURGICAL SUPPLY — 65 items
BAG ZIPLOCK 12X15 (MISCELLANEOUS) ×2 IMPLANT
BIT DRILL 2.0X128 (BIT) ×2 IMPLANT
BLADE SAW SGTL 83.5X18.5 (BLADE) ×2 IMPLANT
CALIBRATOR GLENOID VIP 5-D (SYSTAGENIX WOUND MANAGEMENT) ×2 IMPLANT
CEMENT BONE DEPUY (Cement) ×2 IMPLANT
COOLER ICEMAN CLASSIC (MISCELLANEOUS) ×2 IMPLANT
COVER BACK TABLE 60X90IN (DRAPES) ×2 IMPLANT
COVER SURGICAL LIGHT HANDLE (MISCELLANEOUS) ×2 IMPLANT
COVER WAND RF STERILE (DRAPES) IMPLANT
DERMABOND ADVANCED (GAUZE/BANDAGES/DRESSINGS) ×1
DERMABOND ADVANCED .7 DNX12 (GAUZE/BANDAGES/DRESSINGS) ×1 IMPLANT
DRAPE ORTHO SPLIT 77X108 STRL (DRAPES) ×4
DRAPE SHEET LG 3/4 BI-LAMINATE (DRAPES) ×2 IMPLANT
DRAPE SURG 17X11 SM STRL (DRAPES) ×2 IMPLANT
DRAPE SURG ORHT 6 SPLT 77X108 (DRAPES) ×2 IMPLANT
DRAPE U-SHAPE 47X51 STRL (DRAPES) ×2 IMPLANT
DRESSING AQUACEL AG SP 3.5X6 (GAUZE/BANDAGES/DRESSINGS) ×1 IMPLANT
DRSG AQUACEL AG ADV 3.5X10 (GAUZE/BANDAGES/DRESSINGS) IMPLANT
DRSG AQUACEL AG SP 3.5X6 (GAUZE/BANDAGES/DRESSINGS) ×2
DURAPREP 26ML APPLICATOR (WOUND CARE) ×4 IMPLANT
ELECT BLADE TIP CTD 4 INCH (ELECTRODE) ×2 IMPLANT
ELECT REM PT RETURN 15FT ADLT (MISCELLANEOUS) ×2 IMPLANT
FACESHIELD WRAPAROUND (MASK) ×8 IMPLANT
GLENOID WITH CLEAT SM (Miscellaneous) ×2 IMPLANT
GLOVE BIO SURGEON STRL SZ7.5 (GLOVE) ×2 IMPLANT
GLOVE BIO SURGEON STRL SZ8 (GLOVE) ×2 IMPLANT
GLOVE SS BIOGEL STRL SZ 7 (GLOVE) ×1 IMPLANT
GLOVE SS BIOGEL STRL SZ 7.5 (GLOVE) ×1 IMPLANT
GLOVE SUPERSENSE BIOGEL SZ 7 (GLOVE) ×1
GLOVE SUPERSENSE BIOGEL SZ 7.5 (GLOVE) ×1
GOWN STRL REUS W/TWL LRG LVL3 (GOWN DISPOSABLE) ×4 IMPLANT
HEAD HUMERAL ECLIPSE 39/18 (Shoulder) ×2 IMPLANT
IMPL ECLIPSE SPEEDCAP (Shoulder) ×1 IMPLANT
IMPLANT ECLIPSE SPEEDCAP (Shoulder) ×2 IMPLANT
KIT BASIN (CUSTOM PROCEDURE TRAY) ×2 IMPLANT
KIT TURNOVER KIT A (KITS) IMPLANT
MANIFOLD NEPTUNE II (INSTRUMENTS) ×2 IMPLANT
MARKER SKIN DUAL TIP RULER LAB (MISCELLANEOUS) ×2 IMPLANT
NEEDLE TAPERED W/ NITINOL LOOP (MISCELLANEOUS) ×2 IMPLANT
NS IRRIG 1000ML POUR BTL (IV SOLUTION) ×2 IMPLANT
PACK SHOULDER (CUSTOM PROCEDURE TRAY) ×2 IMPLANT
PAD COLD SHLDR WRAP-ON (PAD) ×2 IMPLANT
PROTECTOR NERVE ULNAR (MISCELLANEOUS) ×2 IMPLANT
RESTRAINT HEAD UNIVERSAL NS (MISCELLANEOUS) ×2 IMPLANT
SCREW SM FOR ECLIPSE CAGE 30 (Screw) ×2 IMPLANT
SIZER ECLIPSE CAGE SCREW (ORTHOPEDIC DISPOSABLE SUPPLIES) ×2 IMPLANT
SLING ARM FOAM STRAP LRG (SOFTGOODS) IMPLANT
SLING ARM FOAM STRAP MED (SOFTGOODS) ×2 IMPLANT
SMARTMIX MINI TOWER (MISCELLANEOUS)
SPONGE LAP 18X18 RF (DISPOSABLE) IMPLANT
SUCTION FRAZIER HANDLE 12FR (TUBING) ×2
SUCTION TUBE FRAZIER 12FR DISP (TUBING) ×1 IMPLANT
SUT FIBERWIRE #2 38 T-5 BLUE (SUTURE)
SUT MNCRL AB 3-0 PS2 18 (SUTURE) ×2 IMPLANT
SUT MON AB 2-0 CT1 36 (SUTURE) ×2 IMPLANT
SUT VIC AB 1 CT1 36 (SUTURE) ×6 IMPLANT
SUTURE FIBERWR #2 38 T-5 BLUE (SUTURE) IMPLANT
SUTURE TAPE 1.3 40 TPR END (SUTURE) ×4 IMPLANT
SUTURETAPE 1.3 40 TPR END (SUTURE) ×8
TOWEL OR 17X26 10 PK STRL BLUE (TOWEL DISPOSABLE) ×2 IMPLANT
TOWEL OR NON WOVEN STRL DISP B (DISPOSABLE) ×2 IMPLANT
TOWER SMARTMIX MINI (MISCELLANEOUS) IMPLANT
TRUNION ECLIPSE 37 (Miscellaneous) ×2 IMPLANT
WATER STERILE IRR 1000ML POUR (IV SOLUTION) ×4 IMPLANT
YANKAUER SUCT BULB TIP 10FT TU (MISCELLANEOUS) ×2 IMPLANT

## 2019-11-25 NOTE — Anesthesia Procedure Notes (Signed)
Procedure Name: Intubation Date/Time: 11/25/2019 7:46 AM Performed by: West Pugh, CRNA Pre-anesthesia Checklist: Patient identified, Emergency Drugs available, Suction available, Patient being monitored and Timeout performed Patient Re-evaluated:Patient Re-evaluated prior to induction Oxygen Delivery Method: Circle system utilized Preoxygenation: Pre-oxygenation with 100% oxygen Induction Type: IV induction Ventilation: Mask ventilation without difficulty Laryngoscope Size: Mac and 3 Grade View: Grade I Tube type: Oral Tube size: 7.0 mm Number of attempts: 1 Airway Equipment and Method: Stylet Placement Confirmation: ETT inserted through vocal cords under direct vision,  positive ETCO2,  CO2 detector and breath sounds checked- equal and bilateral Secured at: 20 cm Tube secured with: Tape Dental Injury: Teeth and Oropharynx as per pre-operative assessment

## 2019-11-25 NOTE — Discharge Instructions (Signed)
 Kevin M. Supple, M.D., F.A.A.O.S. Orthopaedic Surgery Specializing in Arthroscopic and Reconstructive Surgery of the Shoulder [phone] [address] - [place], [place] [address] - Fax [phone]   POST-OP TOTAL SHOULDER REPLACEMENT INSTRUCTIONS  1. Follow up in the office for your first post-op appointment 10-14 days from the date of your surgery. If you do not already have a scheduled appointment, our office will contact you to schedule.  2. The bandage over your incision is waterproof. You may begin showering with this dressing on. You may leave this dressing on until first follow up appointment within 2 weeks. We prefer you leave this dressing in place until follow up however after 5-7 days if you are having itching or skin irritation and would like to remove it you may do so. Go slow and tug at the borders gently to break the bond the dressing has with the skin. At this point if there is no drainage it is okay to go without a bandage or you may cover it with a light guaze and tape. You can also expect significant bruising around your shoulder that will drift down your arm and into your chest wall. This is very normal and should resolve over several days.   3. Wear your sling/immobilizer at all times except to perform the exercises below or to occasionally let your arm dangle by your side to stretch your elbow. You also need to sleep in your sling immobilizer until instructed otherwise. It is ok to remove your sling if you are sitting in a controlled environment and allow your arm to rest in a position of comfort by your side or on your lap with pillows to give your neck and skin a break from the sling. You may remove it to allow arm to dangle by side to shower. If you are up walking around and when you go to sleep at night you need to wear it.  4. Range of motion to your elbow, wrist, and hand are encouraged 3-5 times daily. Exercise to your hand and fingers helps to reduce  swelling you may experience.   5. Prescriptions for a pain medication and a muscle relaxant are provided for you. It is recommended that if you are experiencing pain that you pain medication alone is not controlling, add the muscle relaxant along with the pain medication which can give additional pain relief. The first 1-2 days is generally the most severe of your pain and then should gradually decrease. As your pain lessens it is recommended that you decrease your use of the pain medications to an "as needed basis'" only and to always comply with the recommended dosages of the pain medications.  6. Pain medications can produce constipation along with their use. If you experience this, the use of an over the counter stool softener or laxative daily is recommended.   7. For additional questions or concerns, please do not hesitate to call the office. If after hours there is an answering service to forward your concerns to the physician on call.  8.Pain control following an exparel block  To help control your post-operative pain you received a nerve block  performed with Exparel which is a long acting anesthetic (numbing agent) which can provide pain relief and sensations of numbness (and relief of pain) in the operative shoulder and arm for up to 3 days. Sometimes it provides mixed relief, meaning you may still have numbness in certain areas of the arm but can still be able to   move  parts of that arm, hand, and fingers. We recommend that your prescribed pain medications  be used as needed. We do not feel it is necessary to "pre medicate" and "stay ahead" of pain.  Taking narcotic pain medications when you are not having any pain can lead to unnecessary and potentially dangerous side effects.    9. Use the ice machine as much as possible in the first 5-7 days from surgery, then you can wean its use to as needed. The ice typically needs to be replaced every 6 hours, instead of ice you can actually freeze  water bottles to put in the cooler and then fill water around them to avoid having to purchase ice. You can have spare water bottles freezing to allow you to rotate them once they have melted. Try to have a thin shirt or light cloth or towel under the ice wrap to protect your skin.   10.  We recommend that you avoid any dental work or cleaning in the first 3 months following your joint replacement. This is to help minimize the possibility of infection from the bacteria in your mouth that enters your bloodstream during dental work. We also recommend that you take an antibiotic prior to your dental work for the first year after your shoulder replacement to further help reduce that risk. Please simply contact our office for antibiotics to be sent to your pharmacy prior to dental work.  POST-OP EXERCISES  Pendulum Exercises  Perform pendulum exercises while standing and bending at the waist. Support your uninvolved arm on a table or chair and allow your operated arm to hang freely. Make sure to do these exercises passively - not using you shoulder muscles. These exercises can be performed once your nerve block effects have worn off.  Repeat 20 times. Do 3 sessions per day.

## 2019-11-25 NOTE — Op Note (Signed)
11/25/2019  9:33 AM  PATIENT:   Rebekah King  79 y.o. female  PRE-OPERATIVE DIAGNOSIS:  right shoulder osteoarthritis  POST-OPERATIVE DIAGNOSIS: Same  PROCEDURE: Right anatomic total shoulder arthroplasty utilizing an Arthrex Eclipse stemless implant with a size 37 collar and a small central screw, small glenoid, 39 x 18 concentric humeral head  SURGEON:  Masiel Gentzler, Metta Clines M.D.  ASSISTANTS: Jenetta Loges, PA-C  ANESTHESIA:   General endotracheal and interscalene block with Exparel  EBL: 150 cc  SPECIMEN: None  Drains: None   PATIENT DISPOSITION:  PACU - hemodynamically stable.    PLAN OF CARE: Admit for overnight observation  Brief history:  Ms. Javier has been followed for chronic and progressively increasing right shoulder pain related to advanced osteoarthritis.  Due to her increasing functional imitations and failure to respond to conservative management she is brought to the operating this time for planned right anatomic total shoulder arthroplasty.  Preoperatively I counseled her regarding treatment options as well last potential risks versus benefits thereof.  Possible surgical complications were reviewed including bleeding, infection, neurovascular injury, persistent pain, anesthetic complication, failure the implant, and possible need for additional surgery.  She understands, and accepts, and agrees with our planned procedure.  Procedure in detail:  After undergoing routine preop evaluation patient received prophylactic antibiotics and interscalene block with Exparel was established in the holding area by the anesthesia department.  Subsequently placed supine on the operating table and underwent the smooth induction of a general endotracheal anesthesia.  Placed into the beachchair position and appropriately padded and protected.  The right shoulder girdle region was sterilely prepped and draped in standard fashion.  Timeout was called.  An anterior 8 cm incision was  made along the deltopectoral interval with dissection carried deeply and the deltopectoral interval was then developed from proximal to distal with the vein taken laterally.  The upper centimeter of the pectoralis major was tenotomized for exposure and conjoined tendon was mobilized and retracted medially and self-retaining retractors were placed.  The long head biceps tendon was then unroofed and tenodesed at the upper border the pectoralis major tendon the proximal segment was then elevated and excised.  We then split the rotator cuff along the rotator interval and then carefully outlined the insertion of the subscapularis into the lesser tuberosity and identified the appropriate alignment for our lesser tuberosity osteotomy which we performed with an oscillating saw.  We then tagged the margin of the subscapularis and then carefully dissected the capsule attachments from the anterior and infra margins of the humeral neck allowing deliver the humeral head through the wound.  We then outlined the proposed humeral head resection using extra medullary guide and this was then performed at the native degrees retroversion with an oscillating saw taking care to protect the rotator cuff superiorly and posteriorly.  Marginal osteophytes were then removed from the humeral neck and then we outlined our planned humeral collar prior stemless implant and once the location of the humeral head was determined this was marked with the appropriate guide and then a metal cap was placed over the cut proximal humeral surface and we then exposed the glenoid using appropriate retractors.  I performed a circumferential labral resection.  At this point the VIP guide was then used to direct our guidepin into the center of the glenoid using the preoperative CT scan templating to assure proper positioning.  We then reamed the glenoid to a stable subchondral bony bed correcting the B2 deformity.  Terminal preparation was  then completed with  the central followed by the superior and inferior peg and slot preparation.  The glenoid was then broached and a trial showed excellent fit.  At this point the glenoid was cleaned and dried and cement was mixed and introduced into the superior and inferior peg and slot respectively and then morselized bone was impacted around the central peg of the glenoid it was then impacted with excellent fit and fixation.  We then returned our attention back to the proximal humerus where we placed the trunnion of our humeral collar which was impacted and then the central cage was screwed into position using a size small cage.  Good stability and fixation was achieved.  We then performed a series of trial reductions and ultimately felt that the 39 x 18 head gave [place] the best soft tissue balance with good motion good stability and 50% translation across the glenoid.  At this point the trial was removed.  We then placed the suture anchors for the repair of the subscapularis using the appropriate guide.  The sutures were all then seated medially.  Once our repair sutures of the subscapularis were placed we then impacted our final 39 x 18 head onto the trunnion.  Final reduction was performed which again showed excellent motion good stability good soft tissue balance.  The joint was irrigated.  We then passed our medial suture limbs through the subscapularis at the bone tendon junction placing the 3 pairs of limbus equidistant across the width of the bone tendon junction and these were then delivered in a sequential fashion into 2 lateral anchors allowing [place] to create a double row repair with excellent apposition of our lesser tuberosity osteotomy into the original footprint.  The overall construct was much to our satisfaction.  We then repaired the rotator interval with a series of figure-of-eight suture tape sutures.  Upon completion the arm easily achieved 40 degrees of external rotation with good stability now and without  excessive tension on the LTO repair.  The wound was then copiously irrigated.  Hemostasis was obtained.  The deltopectoral interval was closed with a series of figure-of-eight #1 Vicryl sutures.  2-0 Monocryl used for the subcu layer and intracuticular 3-0 Monocryl for the skin followed by Dermabond and Aquacel dressing Robinson placed in a sling the patient was awakened, extubated, and taken to the recovery him in stable condition.  Jenetta Loges, PA-C was utilized as an Environmental consultant throughout this case essential for help with positioning the patient, positioning extremity, tissue manipulation, implantation of the prosthesis, wound closure, and intraoperative decision-making.  Marin Shutter MD   Contact # [phone]

## 2019-11-25 NOTE — Progress Notes (Signed)
Patient and husband educated about pendulum exercises, sling don/doff, pain control, wound care, dressing and all other ADLs and d/c instructions. Olivia Mackie PA had also covered all topics w/ patient and family. Both verbalized understanding of instructions.

## 2019-11-25 NOTE — Anesthesia Preprocedure Evaluation (Addendum)
Anesthesia Evaluation  Patient identified by MRN, date of birth, ID band Patient awake    Reviewed: Allergy & Precautions, NPO status , Patient's Chart, lab work & pertinent test results  Airway Mallampati: I  TM Distance: >3 FB Neck ROM: Full    Dental  (+) Teeth Intact, Dental Advisory Given   Pulmonary neg pulmonary ROS,    breath sounds clear to auscultation       Cardiovascular negative cardio ROS   Rhythm:Regular Rate:Normal     Neuro/Psych negative neurological ROS  negative psych ROS   GI/Hepatic Neg liver ROS, GERD  ,  Endo/Other  Hypothyroidism   Renal/GU negative Renal ROS     Musculoskeletal  (+) Arthritis ,   Abdominal Normal abdominal exam  (+)   Peds  Hematology negative hematology ROS (+)   Anesthesia Other Findings   Reproductive/Obstetrics                            Lab Results  Component Value Date   WBC 7.1 11/18/2019   HGB 11.8 (L) 11/18/2019   HCT 35.5 (L) 11/18/2019   MCV 96.7 11/18/2019   PLT 209 11/18/2019   Lab Results  Component Value Date   CREATININE 0.91 11/18/2019   BUN 18 11/18/2019   NA 137 11/18/2019   K 4.0 11/18/2019   CL 105 11/18/2019   CO2 25 11/18/2019   EKG: normal sinus rhythm.  Anesthesia Physical Anesthesia Plan  ASA: II  Anesthesia Plan: General   Post-op Pain Management:    Induction: Intravenous  PONV Risk Score and Plan: 4 or greater and Ondansetron, Dexamethasone and Treatment may vary due to age or medical condition  Airway Management Planned: Oral ETT  Additional Equipment: None  Intra-op Plan:   Post-operative Plan: Extubation in OR  Informed Consent: I have reviewed the patients History and Physical, chart, labs and discussed the procedure including the risks, benefits and alternatives for the proposed anesthesia with the patient or authorized representative who has indicated his/her understanding and  acceptance.     Dental advisory given  Plan Discussed with: CRNA  Anesthesia Plan Comments:        Anesthesia Quick Evaluation

## 2019-11-25 NOTE — Anesthesia Postprocedure Evaluation (Signed)
Anesthesia Post Note  Patient: Rebekah King  Procedure(s) Performed: TOTAL SHOULDER ARTHROPLASTY (Right Shoulder)     Patient location during evaluation: PACU Anesthesia Type: General Level of consciousness: awake and alert Pain management: pain level controlled Vital Signs Assessment: post-procedure vital signs reviewed and stable Respiratory status: spontaneous breathing, nonlabored ventilation, respiratory function stable and patient connected to nasal cannula oxygen Cardiovascular status: blood pressure returned to baseline and stable Postop Assessment: no apparent nausea or vomiting Anesthetic complications: no    Last Vitals:  Vitals:   11/25/19 1030 11/25/19 1045  BP: 128/70 (!) 164/75  Pulse: 75 78  Resp: 15 19  Temp:  36.4 C  SpO2: 96% 96%    Last Pain:  Vitals:   11/25/19 1045  TempSrc:   PainSc: 0-No pain                 Effie Berkshire

## 2019-11-25 NOTE — Transfer of Care (Signed)
Immediate Anesthesia Transfer of Care Note  Patient: Rebekah King  Procedure(s) Performed: TOTAL SHOULDER ARTHROPLASTY (Right Shoulder)  Patient Location: PACU  Anesthesia Type:GA combined with regional for post-op pain  Level of Consciousness: awake, alert  and patient cooperative  Airway & Oxygen Therapy: Patient Spontanous Breathing and Patient connected to nasal cannula oxygen  Post-op Assessment: Report given to RN and Post -op Vital signs reviewed and stable  Post vital signs: Reviewed and stable  Last Vitals:  Vitals Value Taken Time  BP 163/80 11/25/19 0941  Temp    Pulse 86 11/25/19 0944  Resp 15 11/25/19 0944  SpO2 96 % 11/25/19 0944  Vitals shown include unvalidated device data.  Last Pain:  Vitals:   11/25/19 0607  TempSrc:   PainSc: 2       Patients Stated Pain Goal: 2 ([id number])  Complications: No apparent anesthesia complications

## 2019-11-25 NOTE — Anesthesia Procedure Notes (Signed)
Anesthesia Regional Block: Interscalene brachial plexus block   Pre-Anesthetic Checklist: ,, timeout performed, Correct Patient, Correct Site, Correct Laterality, Correct Procedure, Correct Position, site marked, Risks and benefits discussed,  Surgical consent,  Pre-op evaluation,  At surgeon's request and post-op pain management  Laterality: Right  Prep: chloraprep       Needles:  Injection technique: Single-shot  Needle Type: Echogenic Stimulator Needle     Needle Length: 9cm  Needle Gauge: 21     Additional Needles:   Procedures:,,,, ultrasound used (permanent image in chart),,,,  Narrative:  Start time: 11/25/2019 6:50 AM End time: 11/25/2019 7:00 AM Injection made incrementally with aspirations every 5 mL.  Performed by: Personally  Anesthesiologist: Effie Berkshire, MD  Additional Notes: Patient tolerated the procedure well. Local anesthetic introduced in an incremental fashion under minimal resistance after negative aspirations. No paresthesias were elicited. After completion of the procedure, no acute issues were identified and patient continued to be monitored by RN.

## 2019-11-25 NOTE — Discharge Summary (Signed)
PATIENT ID:      Rebekah King  MRN:     [id number] DOB/AGE:    Aug 31, 1940 / 79 y.o.     DISCHARGE SUMMARY  ADMISSION DATE:    11/25/2019 DISCHARGE DATE:    ADMISSION DIAGNOSIS: right shoulder osteoarthritis Past Medical History:  Diagnosis Date  . Arthritis   . Complication of anesthesia    CLAUSTROPHOBIC- TROUBLE WITH ANYTHING OVER FACE  . GERD (gastroesophageal reflux disease)   . Glaucoma   . Hypothyroidism   . UTI (urinary tract infection)     DISCHARGE DIAGNOSIS:   Active Problems:   Status post total shoulder arthroplasty, right   PROCEDURE: Procedure(s): TOTAL SHOULDER ARTHROPLASTY on 11/25/2019  CONSULTS:    HISTORY:  See H&P in chart.  HOSPITAL COURSE:  Rebekah King is a 79 y.o. admitted on 11/25/2019 with a diagnosis of right shoulder osteoarthritis.  They were brought to the operating room on 11/25/2019 and underwent Procedure(s): TOTAL SHOULDER ARTHROPLASTY.    They were given perioperative antibiotics:  Anti-infectives (From admission, onward)   Start     Dose/Rate Route Frequency Ordered Stop   11/25/19 2200  nitrofurantoin (MACRODANTIN) capsule 50 mg     50 mg Oral Daily at bedtime 11/25/19 1122     11/25/19 0600  ceFAZolin (ANCEF) IVPB 2g/100 mL premix     2 g 200 mL/hr over 30 Minutes Intravenous On call to O.R. 11/25/19 [id number] 11/25/19 0817    .  Patient underwent the above named procedure and tolerated it well. She was going to stay overnight and was transferred to the floor however on post op rounds she was doing so well she wanted to DC home. Husband was at bedside and I reviewed all instructions and discharged her to home    DIAGNOSTIC STUDIES:  RECENT RADIOGRAPHIC STUDIES :  No results found.  RECENT VITAL SIGNS:   Patient Vitals for the past 24 hrs:  BP Temp Temp src Pulse Resp SpO2 Height Weight  11/25/19 1419 (!) 162/75 -- -- 91 16 97 % -- --  11/25/19 1110 (!) 171/75 97.7 F (36.5 C) Oral 75 -- 95 % -- --  11/25/19 1045 (!) 164/75  97.6 F (36.4 C) -- 78 19 96 % -- --  11/25/19 1030 128/70 -- -- 75 15 96 % -- --  11/25/19 1015 (!) 151/73 -- -- 75 16 96 % -- --  11/25/19 1000 (!) 159/76 -- -- 84 20 97 % -- --  11/25/19 0945 (!) 167/76 -- -- 86 17 96 % -- --  11/25/19 0941 (!) 163/80 (!) 97.4 F (36.3 C) -- 86 17 95 % -- --  11/25/19 0607 -- -- -- -- -- -- 5\' 2"  (1.575 m) 56 kg  11/25/19 0602 (!) 151/84 98 F (36.7 C) Oral 85 16 99 % -- --  .  RECENT EKG RESULTS:    Orders placed or performed during the hospital encounter of 11/18/19  . EKG 12 lead  . EKG 12 lead    DISCHARGE INSTRUCTIONS:    DISCHARGE MEDICATIONS:   Allergies as of 11/25/2019      Reactions   Amoxicillin-pot Clavulanate Diarrhea   GI pain Has patient had a PCN reaction causing immediate rash, facial/tongue/throat swelling, SOB or lightheadedness with hypotension: No Has patient had a PCN reaction causing severe rash involving mucus membranes or skin necrosis: No Has patient had a PCN reaction that required hospitalization: No Has patient had a PCN reaction occurring within the last 10  years: No If all of the above answers are "NO", then may proceed with Cephalosporin use.   Sulfa Antibiotics Nausea Only, Rash   Hands go numb      Medication List    TAKE these medications   aspirin EC 81 MG tablet Take 81 mg by mouth daily.   CALCIUM 600+D3 PO Take 1 tablet by mouth in the morning and at bedtime.   cyclobenzaprine 10 MG tablet Commonly known as: FLEXERIL Take 1 tablet (10 mg total) by mouth 3 (three) times daily as needed for muscle spasms.   dorzolamide 2 % ophthalmic solution Commonly known as: TRUSOPT Place 1 drop into both eyes 3 (three) times daily.   estradiol 0.5 MG tablet Commonly known as: ESTRACE Take 0.25 mg by mouth 2 (two) times a week.   famotidine 20 MG tablet Commonly known as: PEPCID Take 20 mg by mouth 2 (two) times daily.   Fish Oil 1200 MG Caps Take 1,200 mg by mouth daily.   ibuprofen 200 MG  tablet Commonly known as: ADVIL Take 400 mg by mouth in the morning, at noon, and at bedtime.   latanoprost 0.005 % ophthalmic solution Commonly known as: XALATAN Place 1 drop into both eyes at bedtime.   levothyroxine 50 MCG tablet Commonly known as: SYNTHROID Take 50 mcg by mouth daily before breakfast.   multivitamin with minerals Tabs tablet Take 1 tablet by mouth daily.   nitrofurantoin 50 MG capsule Commonly known as: MACRODANTIN Take 1 capsule (50 mg total) by mouth at bedtime.   ondansetron 4 MG tablet Commonly known as: Zofran Take 1 tablet (4 mg total) by mouth every 8 (eight) hours as needed for nausea or vomiting.   oxyCODONE-acetaminophen 5-325 MG tablet Commonly known as: Percocet Take 1 tablet by mouth every 4 (four) hours as needed (max 6 q).   simvastatin 20 MG tablet Commonly known as: ZOCOR Take 20 mg by mouth every evening.   vitamin C with rose hips 500 MG tablet Take 500 mg by mouth daily.       FOLLOW UP VISIT:   Follow-up Information    Justice Britain, MD.   Specialty: Orthopedic Surgery Why: in 10-14 days Contact information: [address] [place] [address] [id number]           DISCHARGE TO: Home   DISCHARGE CONDITION:  Thereasa Parkin Frenchie Dangerfield for Dr. Justice Britain 11/25/2019, 5:24 PM

## 2019-11-25 NOTE — H&P (Signed)
Rebekah King    Chief Complaint: right shoulder osteoarthritis HPI: The patient is a 79 y.o. female with advanced right shoulder osteoarthritis and associated pain and functional mutations which has been refractory to prolonged attempts at conservative management.  Past Medical History:  Diagnosis Date  . Arthritis   . Complication of anesthesia    CHLOSTROPHOBIC- TROUBLE WITH ANYTHING OVER FACE  . GERD (gastroesophageal reflux disease)   . Glaucoma   . Hypothyroidism   . UTI (urinary tract infection)     Past Surgical History:  Procedure Laterality Date  . ABDOMINAL HYSTERECTOMY    . CATARACT EXTRACTION W/ INTRAOCULAR LENS  IMPLANT, BILATERAL    . HERNIA REPAIR    . HIP FRACTURE SURGERY      RIGHT    (2ND SURGERY TO REMOVE PINS)  . REFRACTIVE SURGERY    . RETINAL DETACHMENT SURGERY     RIGHT  . 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   Procedure: TOTAL KNEE ARTHROPLASTY;  Surgeon: Earlie Server, MD;  Location: [facility];  Service: Orthopedics;  Laterality: Right;    History reviewed. No pertinent family history.  Social History:  reports that she has never smoked. She has never used smokeless tobacco. She reports that she does not drink alcohol or use drugs.   Medications Prior to Admission  Medication Sig Dispense Refill  . Ascorbic Acid (VITAMIN C WITH ROSE HIPS) 500 MG tablet Take 500 mg by mouth daily.    Marland Kitchen aspirin EC 81 MG tablet Take 81 mg by mouth daily.    . Calcium Carb-Cholecalciferol (CALCIUM 600+D3 PO) Take 1 tablet by mouth in the morning and at bedtime.    . dorzolamide (TRUSOPT) 2 % ophthalmic solution Place 1 drop into both eyes 3 (three) times daily.     Marland Kitchen estradiol (ESTRACE) 0.5 MG tablet Take 0.25 mg by mouth 2 (two) times a week.     . famotidine (PEPCID) 20 MG tablet Take 20 mg by mouth 2 (two) times daily.    Marland Kitchen ibuprofen (ADVIL) 200 MG tablet Take 400 mg by mouth in the morning, at noon, and at bedtime.    Marland Kitchen latanoprost (XALATAN) 0.005 % ophthalmic  solution Place 1 drop into both eyes at bedtime.    Marland Kitchen levothyroxine (SYNTHROID, LEVOTHROID) 50 MCG tablet Take 50 mcg by mouth daily before breakfast.    . Multiple Vitamin (MULTIVITAMIN WITH MINERALS) TABS tablet Take 1 tablet by mouth daily.    . nitrofurantoin (MACRODANTIN) 50 MG capsule Take 1 capsule (50 mg total) by mouth at bedtime. 90 capsule 3  . Omega-3 Fatty Acids (FISH OIL) 1200 MG CAPS Take 1,200 mg by mouth daily.    . simvastatin (ZOCOR) 20 MG tablet Take 20 mg by mouth every evening.       Physical Exam: Right shoulder demonstrates painful and guarded motion as noted at recent office visits.  Plain radiographs confirm complete loss of joint space, subchondral sclerosis, and peripheral osteophyte formation.  Vitals  Temp:  [98 F (36.7 C)] 98 F (36.7 C) (04/29 0602) Pulse Rate:  [85] 85 (04/29 0602) Resp:  [16] 16 (04/29 0602) BP: (151)/(84) 151/84 (04/29 0602) SpO2:  [99 %] 99 % (04/29 0602) Weight:  [56 kg] 56 kg (04/29 0607)  Assessment/Plan  Impression: right shoulder osteoarthritis  Plan of Action: Procedure(s): TOTAL SHOULDER ARTHROPLASTY  Juliona Vales M Marquis Down 11/25/2019, 6:48 AM Contact # [phone]

## 2019-11-26 ENCOUNTER — Encounter: Payer: Self-pay | Admitting: *Deleted

## 2019-11-26 DIAGNOSIS — E7849 Other hyperlipidemia: Secondary | ICD-10-CM | POA: Diagnosis not present

## 2019-11-26 DIAGNOSIS — I10 Essential (primary) hypertension: Secondary | ICD-10-CM | POA: Diagnosis not present

## 2019-12-02 DIAGNOSIS — Z5189 Encounter for other specified aftercare: Secondary | ICD-10-CM | POA: Insufficient documentation

## 2019-12-06 DIAGNOSIS — Z471 Aftercare following joint replacement surgery: Secondary | ICD-10-CM | POA: Diagnosis not present

## 2019-12-06 DIAGNOSIS — Z96611 Presence of right artificial shoulder joint: Secondary | ICD-10-CM | POA: Diagnosis not present

## 2019-12-14 DIAGNOSIS — M419 Scoliosis, unspecified: Secondary | ICD-10-CM | POA: Diagnosis not present

## 2019-12-14 DIAGNOSIS — M545 Low back pain: Secondary | ICD-10-CM | POA: Diagnosis not present

## 2019-12-21 DIAGNOSIS — Z471 Aftercare following joint replacement surgery: Secondary | ICD-10-CM | POA: Diagnosis not present

## 2019-12-21 DIAGNOSIS — M6281 Muscle weakness (generalized): Secondary | ICD-10-CM | POA: Diagnosis not present

## 2019-12-21 DIAGNOSIS — M25611 Stiffness of right shoulder, not elsewhere classified: Secondary | ICD-10-CM | POA: Diagnosis not present

## 2019-12-21 DIAGNOSIS — Z96611 Presence of right artificial shoulder joint: Secondary | ICD-10-CM | POA: Diagnosis not present

## 2019-12-24 DIAGNOSIS — Z96611 Presence of right artificial shoulder joint: Secondary | ICD-10-CM | POA: Diagnosis not present

## 2019-12-24 DIAGNOSIS — M25611 Stiffness of right shoulder, not elsewhere classified: Secondary | ICD-10-CM | POA: Diagnosis not present

## 2019-12-24 DIAGNOSIS — Z471 Aftercare following joint replacement surgery: Secondary | ICD-10-CM | POA: Diagnosis not present

## 2019-12-24 DIAGNOSIS — M6281 Muscle weakness (generalized): Secondary | ICD-10-CM | POA: Diagnosis not present

## 2019-12-29 DIAGNOSIS — Z471 Aftercare following joint replacement surgery: Secondary | ICD-10-CM | POA: Diagnosis not present

## 2019-12-29 DIAGNOSIS — M6281 Muscle weakness (generalized): Secondary | ICD-10-CM | POA: Diagnosis not present

## 2019-12-29 DIAGNOSIS — M25611 Stiffness of right shoulder, not elsewhere classified: Secondary | ICD-10-CM | POA: Diagnosis not present

## 2019-12-29 DIAGNOSIS — Z96611 Presence of right artificial shoulder joint: Secondary | ICD-10-CM | POA: Diagnosis not present

## 2019-12-31 DIAGNOSIS — M25611 Stiffness of right shoulder, not elsewhere classified: Secondary | ICD-10-CM | POA: Diagnosis not present

## 2019-12-31 DIAGNOSIS — Z471 Aftercare following joint replacement surgery: Secondary | ICD-10-CM | POA: Diagnosis not present

## 2019-12-31 DIAGNOSIS — M6281 Muscle weakness (generalized): Secondary | ICD-10-CM | POA: Diagnosis not present

## 2019-12-31 DIAGNOSIS — Z96611 Presence of right artificial shoulder joint: Secondary | ICD-10-CM | POA: Diagnosis not present

## 2020-01-03 DIAGNOSIS — M6281 Muscle weakness (generalized): Secondary | ICD-10-CM | POA: Diagnosis not present

## 2020-01-03 DIAGNOSIS — M25611 Stiffness of right shoulder, not elsewhere classified: Secondary | ICD-10-CM | POA: Diagnosis not present

## 2020-01-03 DIAGNOSIS — Z96611 Presence of right artificial shoulder joint: Secondary | ICD-10-CM | POA: Diagnosis not present

## 2020-01-03 DIAGNOSIS — Z471 Aftercare following joint replacement surgery: Secondary | ICD-10-CM | POA: Diagnosis not present

## 2020-01-05 DIAGNOSIS — Z96611 Presence of right artificial shoulder joint: Secondary | ICD-10-CM | POA: Diagnosis not present

## 2020-01-05 DIAGNOSIS — Z471 Aftercare following joint replacement surgery: Secondary | ICD-10-CM | POA: Diagnosis not present

## 2020-01-06 DIAGNOSIS — M47816 Spondylosis without myelopathy or radiculopathy, lumbar region: Secondary | ICD-10-CM | POA: Diagnosis not present

## 2020-01-06 DIAGNOSIS — M545 Low back pain: Secondary | ICD-10-CM | POA: Diagnosis not present

## 2020-01-06 DIAGNOSIS — M9903 Segmental and somatic dysfunction of lumbar region: Secondary | ICD-10-CM | POA: Diagnosis not present

## 2020-01-07 DIAGNOSIS — Z96611 Presence of right artificial shoulder joint: Secondary | ICD-10-CM | POA: Diagnosis not present

## 2020-01-07 DIAGNOSIS — M6281 Muscle weakness (generalized): Secondary | ICD-10-CM | POA: Diagnosis not present

## 2020-01-07 DIAGNOSIS — Z471 Aftercare following joint replacement surgery: Secondary | ICD-10-CM | POA: Diagnosis not present

## 2020-01-07 DIAGNOSIS — M25611 Stiffness of right shoulder, not elsewhere classified: Secondary | ICD-10-CM | POA: Diagnosis not present

## 2020-02-03 DIAGNOSIS — Z681 Body mass index (BMI) 19 or less, adult: Secondary | ICD-10-CM | POA: Diagnosis not present

## 2020-02-03 DIAGNOSIS — M546 Pain in thoracic spine: Secondary | ICD-10-CM | POA: Diagnosis not present

## 2020-02-03 DIAGNOSIS — M5440 Lumbago with sciatica, unspecified side: Secondary | ICD-10-CM | POA: Diagnosis not present

## 2020-02-03 DIAGNOSIS — M4180 Other forms of scoliosis, site unspecified: Secondary | ICD-10-CM | POA: Diagnosis not present

## 2020-02-03 DIAGNOSIS — I10 Essential (primary) hypertension: Secondary | ICD-10-CM | POA: Diagnosis not present

## 2020-02-03 DIAGNOSIS — G8929 Other chronic pain: Secondary | ICD-10-CM | POA: Diagnosis not present

## 2020-02-07 DIAGNOSIS — M6281 Muscle weakness (generalized): Secondary | ICD-10-CM | POA: Diagnosis not present

## 2020-02-07 DIAGNOSIS — M25611 Stiffness of right shoulder, not elsewhere classified: Secondary | ICD-10-CM | POA: Diagnosis not present

## 2020-02-07 DIAGNOSIS — Z471 Aftercare following joint replacement surgery: Secondary | ICD-10-CM | POA: Diagnosis not present

## 2020-02-07 DIAGNOSIS — Z96611 Presence of right artificial shoulder joint: Secondary | ICD-10-CM | POA: Diagnosis not present

## 2020-02-09 DIAGNOSIS — Z96611 Presence of right artificial shoulder joint: Secondary | ICD-10-CM | POA: Diagnosis not present

## 2020-02-09 DIAGNOSIS — M25611 Stiffness of right shoulder, not elsewhere classified: Secondary | ICD-10-CM | POA: Diagnosis not present

## 2020-02-09 DIAGNOSIS — Z471 Aftercare following joint replacement surgery: Secondary | ICD-10-CM | POA: Diagnosis not present

## 2020-02-09 DIAGNOSIS — M6281 Muscle weakness (generalized): Secondary | ICD-10-CM | POA: Diagnosis not present

## 2020-02-14 DIAGNOSIS — Z471 Aftercare following joint replacement surgery: Secondary | ICD-10-CM | POA: Diagnosis not present

## 2020-02-14 DIAGNOSIS — M25611 Stiffness of right shoulder, not elsewhere classified: Secondary | ICD-10-CM | POA: Diagnosis not present

## 2020-02-14 DIAGNOSIS — Z96611 Presence of right artificial shoulder joint: Secondary | ICD-10-CM | POA: Diagnosis not present

## 2020-02-14 DIAGNOSIS — M6281 Muscle weakness (generalized): Secondary | ICD-10-CM | POA: Diagnosis not present

## 2020-02-16 DIAGNOSIS — Z96611 Presence of right artificial shoulder joint: Secondary | ICD-10-CM | POA: Diagnosis not present

## 2020-02-16 DIAGNOSIS — Z471 Aftercare following joint replacement surgery: Secondary | ICD-10-CM | POA: Diagnosis not present

## 2020-03-03 DIAGNOSIS — R030 Elevated blood-pressure reading, without diagnosis of hypertension: Secondary | ICD-10-CM | POA: Diagnosis not present

## 2020-03-03 DIAGNOSIS — M48062 Spinal stenosis, lumbar region with neurogenic claudication: Secondary | ICD-10-CM | POA: Diagnosis not present

## 2020-03-03 DIAGNOSIS — M47816 Spondylosis without myelopathy or radiculopathy, lumbar region: Secondary | ICD-10-CM | POA: Diagnosis not present

## 2020-03-07 DIAGNOSIS — Z8669 Personal history of other diseases of the nervous system and sense organs: Secondary | ICD-10-CM | POA: Diagnosis not present

## 2020-03-07 DIAGNOSIS — G43109 Migraine with aura, not intractable, without status migrainosus: Secondary | ICD-10-CM | POA: Diagnosis not present

## 2020-03-07 DIAGNOSIS — H401322 Pigmentary glaucoma, left eye, moderate stage: Secondary | ICD-10-CM | POA: Diagnosis not present

## 2020-03-07 DIAGNOSIS — H401313 Pigmentary glaucoma, right eye, severe stage: Secondary | ICD-10-CM | POA: Diagnosis not present

## 2020-03-20 DIAGNOSIS — D239 Other benign neoplasm of skin, unspecified: Secondary | ICD-10-CM | POA: Diagnosis not present

## 2020-03-20 DIAGNOSIS — L239 Allergic contact dermatitis, unspecified cause: Secondary | ICD-10-CM | POA: Diagnosis not present

## 2020-03-20 DIAGNOSIS — L570 Actinic keratosis: Secondary | ICD-10-CM | POA: Diagnosis not present

## 2020-03-22 DIAGNOSIS — M47816 Spondylosis without myelopathy or radiculopathy, lumbar region: Secondary | ICD-10-CM | POA: Diagnosis not present

## 2020-05-03 DIAGNOSIS — M47816 Spondylosis without myelopathy or radiculopathy, lumbar region: Secondary | ICD-10-CM | POA: Diagnosis not present

## 2020-05-06 DIAGNOSIS — Z23 Encounter for immunization: Secondary | ICD-10-CM | POA: Diagnosis not present

## 2020-05-16 DIAGNOSIS — R5383 Other fatigue: Secondary | ICD-10-CM | POA: Diagnosis not present

## 2020-05-16 DIAGNOSIS — E039 Hypothyroidism, unspecified: Secondary | ICD-10-CM | POA: Diagnosis not present

## 2020-05-16 DIAGNOSIS — K2100 Gastro-esophageal reflux disease with esophagitis, without bleeding: Secondary | ICD-10-CM | POA: Diagnosis not present

## 2020-05-16 DIAGNOSIS — E7800 Pure hypercholesterolemia, unspecified: Secondary | ICD-10-CM | POA: Diagnosis not present

## 2020-05-16 DIAGNOSIS — I10 Essential (primary) hypertension: Secondary | ICD-10-CM | POA: Diagnosis not present

## 2020-05-16 DIAGNOSIS — E782 Mixed hyperlipidemia: Secondary | ICD-10-CM | POA: Diagnosis not present

## 2020-05-16 DIAGNOSIS — R739 Hyperglycemia, unspecified: Secondary | ICD-10-CM | POA: Diagnosis not present

## 2020-05-19 DIAGNOSIS — M4180 Other forms of scoliosis, site unspecified: Secondary | ICD-10-CM | POA: Diagnosis not present

## 2020-05-23 DIAGNOSIS — E782 Mixed hyperlipidemia: Secondary | ICD-10-CM | POA: Diagnosis not present

## 2020-05-23 DIAGNOSIS — R7301 Impaired fasting glucose: Secondary | ICD-10-CM | POA: Diagnosis not present

## 2020-05-23 DIAGNOSIS — G3184 Mild cognitive impairment, so stated: Secondary | ICD-10-CM | POA: Diagnosis not present

## 2020-05-23 DIAGNOSIS — I10 Essential (primary) hypertension: Secondary | ICD-10-CM | POA: Diagnosis not present

## 2020-05-23 DIAGNOSIS — M419 Scoliosis, unspecified: Secondary | ICD-10-CM | POA: Diagnosis not present

## 2020-05-23 DIAGNOSIS — Z6821 Body mass index (BMI) 21.0-21.9, adult: Secondary | ICD-10-CM | POA: Diagnosis not present

## 2020-05-23 DIAGNOSIS — Z0001 Encounter for general adult medical examination with abnormal findings: Secondary | ICD-10-CM | POA: Diagnosis not present

## 2020-05-23 DIAGNOSIS — N951 Menopausal and female climacteric states: Secondary | ICD-10-CM | POA: Diagnosis not present

## 2020-05-30 DIAGNOSIS — R030 Elevated blood-pressure reading, without diagnosis of hypertension: Secondary | ICD-10-CM | POA: Diagnosis not present

## 2020-05-30 DIAGNOSIS — M7918 Myalgia, other site: Secondary | ICD-10-CM | POA: Diagnosis not present

## 2020-06-02 DIAGNOSIS — M1711 Unilateral primary osteoarthritis, right knee: Secondary | ICD-10-CM | POA: Diagnosis not present

## 2020-06-12 DIAGNOSIS — Z23 Encounter for immunization: Secondary | ICD-10-CM | POA: Diagnosis not present

## 2020-06-19 DIAGNOSIS — M47814 Spondylosis without myelopathy or radiculopathy, thoracic region: Secondary | ICD-10-CM | POA: Diagnosis not present

## 2020-06-19 DIAGNOSIS — M47816 Spondylosis without myelopathy or radiculopathy, lumbar region: Secondary | ICD-10-CM | POA: Diagnosis not present

## 2020-06-19 DIAGNOSIS — M7918 Myalgia, other site: Secondary | ICD-10-CM | POA: Diagnosis not present

## 2020-07-05 DIAGNOSIS — Z6821 Body mass index (BMI) 21.0-21.9, adult: Secondary | ICD-10-CM | POA: Diagnosis not present

## 2020-07-05 DIAGNOSIS — R350 Frequency of micturition: Secondary | ICD-10-CM | POA: Diagnosis not present

## 2020-07-05 DIAGNOSIS — M47816 Spondylosis without myelopathy or radiculopathy, lumbar region: Secondary | ICD-10-CM | POA: Diagnosis not present

## 2020-07-05 DIAGNOSIS — R030 Elevated blood-pressure reading, without diagnosis of hypertension: Secondary | ICD-10-CM | POA: Diagnosis not present

## 2020-07-25 DIAGNOSIS — R030 Elevated blood-pressure reading, without diagnosis of hypertension: Secondary | ICD-10-CM | POA: Diagnosis not present

## 2020-07-25 DIAGNOSIS — M47816 Spondylosis without myelopathy or radiculopathy, lumbar region: Secondary | ICD-10-CM | POA: Diagnosis not present

## 2020-07-28 DIAGNOSIS — Z6822 Body mass index (BMI) 22.0-22.9, adult: Secondary | ICD-10-CM | POA: Diagnosis not present

## 2020-07-28 DIAGNOSIS — R350 Frequency of micturition: Secondary | ICD-10-CM | POA: Diagnosis not present

## 2020-08-02 DIAGNOSIS — M47816 Spondylosis without myelopathy or radiculopathy, lumbar region: Secondary | ICD-10-CM | POA: Diagnosis not present

## 2020-08-02 DIAGNOSIS — R030 Elevated blood-pressure reading, without diagnosis of hypertension: Secondary | ICD-10-CM | POA: Diagnosis not present

## 2020-08-11 DIAGNOSIS — N3001 Acute cystitis with hematuria: Secondary | ICD-10-CM | POA: Diagnosis not present

## 2020-08-11 DIAGNOSIS — Z6822 Body mass index (BMI) 22.0-22.9, adult: Secondary | ICD-10-CM | POA: Diagnosis not present

## 2020-08-23 ENCOUNTER — Other Ambulatory Visit: Payer: No Typology Code available for payment source

## 2020-08-23 ENCOUNTER — Other Ambulatory Visit: Payer: Self-pay

## 2020-08-23 ENCOUNTER — Telehealth: Payer: Self-pay

## 2020-08-23 DIAGNOSIS — N3021 Other chronic cystitis with hematuria: Secondary | ICD-10-CM

## 2020-08-23 LAB — URINALYSIS, ROUTINE W REFLEX MICROSCOPIC
Bilirubin, UA: NEGATIVE
Glucose, UA: NEGATIVE
Nitrite, UA: NEGATIVE
RBC, UA: NEGATIVE
Specific Gravity, UA: 1.015 (ref 1.005–1.030)
Urobilinogen, Ur: 0.2 mg/dL (ref 0.2–1.0)
pH, UA: 5 (ref 5.0–7.5)

## 2020-08-23 LAB — MICROSCOPIC EXAMINATION: RBC, Urine: NONE SEEN /hpf (ref 0–2)

## 2020-08-25 LAB — URINE CULTURE: Organism ID, Bacteria: NO GROWTH

## 2020-08-31 ENCOUNTER — Telehealth: Payer: Self-pay

## 2020-08-31 DIAGNOSIS — I10 Essential (primary) hypertension: Secondary | ICD-10-CM | POA: Diagnosis not present

## 2020-08-31 DIAGNOSIS — Z6822 Body mass index (BMI) 22.0-22.9, adult: Secondary | ICD-10-CM | POA: Diagnosis not present

## 2020-08-31 NOTE — Telephone Encounter (Signed)
-----   Message from Cleon Gustin, MD sent at 08/31/2020  8:55 AM EST ----- negative ----- Message ----- From: Valentina Lucks, LPN Sent: 8/88/9169   9:39 AM EST To: Cleon Gustin, MD  Pls. Review.

## 2020-08-31 NOTE — Telephone Encounter (Signed)
-----   Message from Patrick L McKenzie, MD sent at 08/31/2020  8:55 AM EST ----- negative ----- Message ----- From: Edelmiro Innocent, LPN Sent: 08/25/2020   9:39 AM EST To: Patrick L McKenzie, MD  Pls. Review.  

## 2020-08-31 NOTE — Telephone Encounter (Signed)
Notified pt urine culture was negative.

## 2020-09-05 DIAGNOSIS — M47816 Spondylosis without myelopathy or radiculopathy, lumbar region: Secondary | ICD-10-CM | POA: Diagnosis not present

## 2020-09-05 DIAGNOSIS — I10 Essential (primary) hypertension: Secondary | ICD-10-CM | POA: Diagnosis not present

## 2020-09-05 DIAGNOSIS — M47814 Spondylosis without myelopathy or radiculopathy, thoracic region: Secondary | ICD-10-CM | POA: Diagnosis not present

## 2020-09-06 ENCOUNTER — Ambulatory Visit: Payer: Medicare Other | Admitting: Urology

## 2020-09-06 DIAGNOSIS — N3021 Other chronic cystitis with hematuria: Secondary | ICD-10-CM

## 2020-09-16 DIAGNOSIS — R109 Unspecified abdominal pain: Secondary | ICD-10-CM | POA: Diagnosis not present

## 2020-09-16 DIAGNOSIS — R634 Abnormal weight loss: Secondary | ICD-10-CM | POA: Diagnosis not present

## 2020-09-16 DIAGNOSIS — Z6821 Body mass index (BMI) 21.0-21.9, adult: Secondary | ICD-10-CM | POA: Diagnosis not present

## 2020-09-19 DIAGNOSIS — I10 Essential (primary) hypertension: Secondary | ICD-10-CM | POA: Diagnosis not present

## 2020-09-19 DIAGNOSIS — E871 Hypo-osmolality and hyponatremia: Secondary | ICD-10-CM | POA: Diagnosis not present

## 2020-09-19 DIAGNOSIS — M545 Low back pain, unspecified: Secondary | ICD-10-CM | POA: Diagnosis not present

## 2020-09-19 DIAGNOSIS — Z6822 Body mass index (BMI) 22.0-22.9, adult: Secondary | ICD-10-CM | POA: Diagnosis not present

## 2020-10-02 ENCOUNTER — Telehealth: Payer: Self-pay

## 2020-10-02 DIAGNOSIS — E782 Mixed hyperlipidemia: Secondary | ICD-10-CM | POA: Diagnosis not present

## 2020-10-02 DIAGNOSIS — K2100 Gastro-esophageal reflux disease with esophagitis, without bleeding: Secondary | ICD-10-CM | POA: Diagnosis not present

## 2020-10-02 DIAGNOSIS — E7800 Pure hypercholesterolemia, unspecified: Secondary | ICD-10-CM | POA: Diagnosis not present

## 2020-10-02 DIAGNOSIS — E7849 Other hyperlipidemia: Secondary | ICD-10-CM | POA: Diagnosis not present

## 2020-10-02 NOTE — Telephone Encounter (Signed)
Patient called needing a refill on: nitrofurantoin (MACRODANTIN) 50 MG capsule She only has 2 left. Please call patient back if there are any issues with refill.  Please fill at: [place], [place] Phone:  [phone]  Fax:  [phone]     Thanks, Helene Kelp

## 2020-10-03 ENCOUNTER — Other Ambulatory Visit: Payer: Self-pay

## 2020-10-03 DIAGNOSIS — N3021 Other chronic cystitis with hematuria: Secondary | ICD-10-CM

## 2020-10-03 MED ORDER — NITROFURANTOIN MACROCRYSTAL 50 MG PO CAPS: 50.0000 mg | ORAL_CAPSULE | Freq: Every day | ORAL | 0 refills | Status: DC

## 2020-10-03 NOTE — Telephone Encounter (Signed)
Patient returned call and made aware.

## 2020-10-03 NOTE — Telephone Encounter (Signed)
Patient called with no answer. Message left to call office. Rx sent to cover patient until upcoming office visit.

## 2020-10-04 DIAGNOSIS — H04123 Dry eye syndrome of bilateral lacrimal glands: Secondary | ICD-10-CM | POA: Diagnosis not present

## 2020-10-04 DIAGNOSIS — Z961 Presence of intraocular lens: Secondary | ICD-10-CM | POA: Diagnosis not present

## 2020-10-04 DIAGNOSIS — H401133 Primary open-angle glaucoma, bilateral, severe stage: Secondary | ICD-10-CM | POA: Diagnosis not present

## 2020-10-04 DIAGNOSIS — R748 Abnormal levels of other serum enzymes: Secondary | ICD-10-CM | POA: Diagnosis not present

## 2020-10-04 DIAGNOSIS — I10 Essential (primary) hypertension: Secondary | ICD-10-CM | POA: Diagnosis not present

## 2020-10-04 DIAGNOSIS — M545 Low back pain, unspecified: Secondary | ICD-10-CM | POA: Diagnosis not present

## 2020-10-04 DIAGNOSIS — Z6822 Body mass index (BMI) 22.0-22.9, adult: Secondary | ICD-10-CM | POA: Diagnosis not present

## 2020-10-04 DIAGNOSIS — E871 Hypo-osmolality and hyponatremia: Secondary | ICD-10-CM | POA: Diagnosis not present

## 2020-10-12 DIAGNOSIS — M546 Pain in thoracic spine: Secondary | ICD-10-CM | POA: Diagnosis not present

## 2020-10-12 DIAGNOSIS — M47814 Spondylosis without myelopathy or radiculopathy, thoracic region: Secondary | ICD-10-CM | POA: Diagnosis not present

## 2020-10-12 DIAGNOSIS — R030 Elevated blood-pressure reading, without diagnosis of hypertension: Secondary | ICD-10-CM | POA: Diagnosis not present

## 2020-10-12 DIAGNOSIS — M47816 Spondylosis without myelopathy or radiculopathy, lumbar region: Secondary | ICD-10-CM | POA: Diagnosis not present

## 2020-10-12 DIAGNOSIS — M4180 Other forms of scoliosis, site unspecified: Secondary | ICD-10-CM | POA: Diagnosis not present

## 2020-10-12 DIAGNOSIS — M48062 Spinal stenosis, lumbar region with neurogenic claudication: Secondary | ICD-10-CM | POA: Diagnosis not present

## 2020-10-12 DIAGNOSIS — M7918 Myalgia, other site: Secondary | ICD-10-CM | POA: Diagnosis not present

## 2020-10-20 DIAGNOSIS — Z6821 Body mass index (BMI) 21.0-21.9, adult: Secondary | ICD-10-CM | POA: Diagnosis not present

## 2020-10-20 DIAGNOSIS — I10 Essential (primary) hypertension: Secondary | ICD-10-CM | POA: Diagnosis not present

## 2020-10-20 DIAGNOSIS — R109 Unspecified abdominal pain: Secondary | ICD-10-CM | POA: Diagnosis not present

## 2020-10-23 ENCOUNTER — Other Ambulatory Visit: Payer: Self-pay

## 2020-10-23 ENCOUNTER — Ambulatory Visit (INDEPENDENT_AMBULATORY_CARE_PROVIDER_SITE_OTHER): Payer: Medicare Other | Admitting: Urology

## 2020-10-23 ENCOUNTER — Encounter: Payer: Self-pay | Admitting: Urology

## 2020-10-23 VITALS — BP 182/70 | HR 66 | Temp 97.9°F | Ht 62.0 in | Wt 120.0 lb

## 2020-10-23 DIAGNOSIS — N3021 Other chronic cystitis with hematuria: Secondary | ICD-10-CM

## 2020-10-23 DIAGNOSIS — R351 Nocturia: Secondary | ICD-10-CM

## 2020-10-23 LAB — URINALYSIS, ROUTINE W REFLEX MICROSCOPIC
Bilirubin, UA: NEGATIVE
Glucose, UA: NEGATIVE
Ketones, UA: NEGATIVE
Leukocytes,UA: NEGATIVE
Nitrite, UA: NEGATIVE
Protein,UA: NEGATIVE
RBC, UA: NEGATIVE
Specific Gravity, UA: 1.01 (ref 1.005–1.030)
Urobilinogen, Ur: 0.2 mg/dL (ref 0.2–1.0)
pH, UA: 5.5 (ref 5.0–7.5)

## 2020-10-23 MED ORDER — NITROFURANTOIN MACROCRYSTAL 50 MG PO CAPS: 50.0000 mg | ORAL_CAPSULE | Freq: Every day | ORAL | 3 refills | Status: DC

## 2020-10-23 NOTE — Progress Notes (Signed)
10/23/2020 12:19 PM   Rebekah King 02/17/1941 [id number]  Referring provider: Caryl Bis, MD [address] [place],  [place] [address]  followup recurrent UTI  HPI: Rebekah King is a 80yo here for followup for recurrent UTI. She was diagnosed with a UTI in Dec 2021. Her last urine culture in 07/2020 was negative. UA today is normal. She is on macrobid 50mg  qhs. She is having intermittent right flank pain for the past month which is dull, intermittent, mild and nonraditing. No hx of nephrolithiasis. She has stabel LUTS on fluid management and timed voiding. No other complaints today   PMH: Past Medical History:  Diagnosis Date  . Arthritis   . Complication of anesthesia    CLAUSTROPHOBIC- TROUBLE WITH ANYTHING OVER FACE  . GERD (gastroesophageal reflux disease)   . Glaucoma   . Hypothyroidism   . UTI (urinary tract infection)     Surgical History: Past Surgical History:  Procedure Laterality Date  . ABDOMINAL HYSTERECTOMY    . CATARACT EXTRACTION W/ INTRAOCULAR LENS  IMPLANT, BILATERAL    . HERNIA REPAIR    . HIP FRACTURE SURGERY      RIGHT    (2ND SURGERY TO REMOVE PINS)  . REFRACTIVE SURGERY    . RETINAL DETACHMENT SURGERY     RIGHT  . 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 TOTAL SHOULDER ARTHROPLASTY Right 11/25/2019   Procedure: TOTAL SHOULDER ARTHROPLASTY;  Surgeon: Justice Britain, MD;  Location: [facility];  Service: Orthopedics;  Laterality: Right;  143min    Home Medications:  Allergies as of 10/23/2020      Reactions   Brimonidine Other (See Comments)   Severe dry mouth   Timolol Other (See Comments)   Lightheaded   Amoxicillin-pot Clavulanate Diarrhea   GI pain Has patient had a PCN reaction causing immediate rash, facial/tongue/throat swelling, SOB or lightheadedness with hypotension: No Has patient had a PCN reaction causing  severe rash involving mucus membranes or skin necrosis: No Has patient had a PCN reaction that required hospitalization: No Has patient had a PCN reaction occurring within the last 10 years: No If all of the above answers are "NO", then may proceed with Cephalosporin use.   Sulfa Antibiotics Nausea Only, Rash   Hands go numb      Medication List       Accurate as of October 23, 2020 12:19 PM. If you have any questions, ask your nurse or doctor.        STOP taking these medications   aspirin EC 81 MG tablet Stopped by: Nicolette Bang, MD   cyclobenzaprine 10 MG tablet Commonly known as: FLEXERIL Stopped by: Nicolette Bang, MD   estradiol 0.5 MG tablet Commonly known as: ESTRACE Stopped by: Nicolette Bang, MD   Fish Oil 1200 MG Caps Stopped by: Nicolette Bang, MD   ibuprofen 200 MG tablet Commonly known as: ADVIL Stopped by: Nicolette Bang, MD   ondansetron 4 MG tablet Commonly known as: Zofran Stopped by: Nicolette Bang, MD   oxyCODONE-acetaminophen 5-325 MG tablet Commonly known as: Percocet Stopped by: Nicolette Bang, MD     TAKE these medications   CALCIUM 600+D3 PO Take 1 tablet by mouth in the morning and at bedtime.   dorzolamide 2 % ophthalmic solution Commonly known as: TRUSOPT Place 1 drop into both eyes 3 (three) times daily.   famotidine  20 MG tablet Commonly known as: PEPCID Take 20 mg by mouth 2 (two) times daily.   latanoprost 0.005 % ophthalmic solution Commonly known as: XALATAN Place 1 drop into both eyes at bedtime.   levothyroxine 50 MCG tablet Commonly known as: SYNTHROID Take 50 mcg by mouth daily before breakfast.   losartan-hydrochlorothiazide 50-12.5 MG tablet Commonly known as: HYZAAR Take 1 tablet by mouth daily.   multivitamin with minerals Tabs tablet Take 1 tablet by mouth daily.   nitrofurantoin 50 MG capsule Commonly known as: MACRODANTIN Take 1 capsule (50 mg total) by mouth at bedtime.    nitrofurantoin 50 MG capsule Commonly known as: MACRODANTIN Take 1 capsule (50 mg total) by mouth at bedtime. Take 1 capsule (50 mg total) by mouth at bedtime.   simvastatin 20 MG tablet Commonly known as: ZOCOR Take 20 mg by mouth every evening.   vitamin C with rose hips 500 MG tablet Take 500 mg by mouth daily.       Allergies:  Allergies  Allergen Reactions  . Brimonidine Other (See Comments)    Severe dry mouth  . Timolol Other (See Comments)    Lightheaded  . Amoxicillin-Pot Clavulanate Diarrhea    GI pain Has patient had a PCN reaction causing immediate rash, facial/tongue/throat swelling, SOB or lightheadedness with hypotension: No Has patient had a PCN reaction causing severe rash involving mucus membranes or skin necrosis: No Has patient had a PCN reaction that required hospitalization: No Has patient had a PCN reaction occurring within the last 10 years: No If all of the above answers are "NO", then may proceed with Cephalosporin use.   . Sulfa Antibiotics Nausea Only and Rash    Hands go numb    Family History: No family history on file.  Social History:  reports that she has never smoked. She has never used smokeless tobacco. She reports that she does not drink alcohol and does not use drugs.  ROS: All other review of systems were reviewed and are negative except what is noted above in HPI  Physical Exam: BP (!) 182/70   Pulse 66   Temp 97.9 F (36.6 C)   Ht 5\' 2"  (1.575 m)   Wt 120 lb (54.4 kg)   BMI 21.95 kg/m   Constitutional:  Alert and oriented, No acute distress. HEENT: [place] AT, moist mucus membranes.  Trachea midline, no masses. Cardiovascular: No clubbing, cyanosis, or edema. Respiratory: Normal respiratory effort, no increased work of breathing. GI: Abdomen is soft, nontender, nondistended, no abdominal masses GU: No CVA tenderness.  Lymph: No cervical or inguinal lymphadenopathy. Skin: No rashes, bruises or suspicious  lesions. Neurologic: Grossly intact, no focal deficits, moving all 4 extremities. Psychiatric: Normal mood and affect.  Laboratory Data: Lab Results  Component Value Date   WBC 7.1 11/18/2019   HGB 11.8 (L) 11/18/2019   HCT 35.5 (L) 11/18/2019   MCV 96.7 11/18/2019   PLT 209 11/18/2019    Lab Results  Component Value Date   CREATININE 0.91 11/18/2019    No results found for: PSA  No results found for: TESTOSTERONE  No results found for: HGBA1C  Urinalysis    Component Value Date/Time   COLORURINE YELLOW 10/07/2017 1310   APPEARANCEUR Clear 08/23/2020 1534   LABSPEC 1.006 10/07/2017 1310   PHURINE 6.0 10/07/2017 1310   GLUCOSEU Negative 08/23/2020 1534   HGBUR NEGATIVE 10/07/2017 1310   BILIRUBINUR Negative 08/23/2020 1534   KETONESUR NEGATIVE 10/07/2017 1310   PROTEINUR 1+ (A) 08/23/2020  [place] 10/07/2017 1310   UROBILINOGEN negative (A) 09/01/2019 1100   NITRITE Negative 08/23/2020 1534   NITRITE NEGATIVE 10/07/2017 1310   LEUKOCYTESUR Trace (A) 08/23/2020 1534    Lab Results  Component Value Date   LABMICR See below: 08/23/2020   WBCUA 6-10 (A) 08/23/2020   LABEPIT 0-10 08/23/2020   MUCUS Present 08/23/2020   BACTERIA Moderate (A) 08/23/2020    Pertinent Imaging:  No results found for this or any previous visit.  No results found for this or any previous visit.  No results found for this or any previous visit.  No results found for this or any previous visit.  No results found for this or any previous visit.  No results found for this or any previous visit.  No results found for this or any previous visit.  No results found for this or any previous visit.   Assessment & Plan:    1. Chronic cystitis with hematuria -refill macrobid 50mg  qhs - Urinalysis, Routine w reflex microscopic  2. Nocturia -continue fluid management.    No follow-ups on file.  Nicolette Bang, MD  [facility] [place]

## 2020-10-23 NOTE — Patient Instructions (Signed)
Urinary Tract Infection, Adult A urinary tract infection (UTI) is an infection of any part of the urinary tract. The urinary tract includes:  The kidneys.  The ureters.  The bladder.  The urethra. These organs make, store, and get rid of pee (urine) in the body. What are the causes? This infection is caused by germs (bacteria) in your genital area. These germs grow and cause swelling (inflammation) of your urinary tract. What increases the risk? The following factors may make you more likely to develop this condition:  Using a small, thin tube (catheter) to drain pee.  Not being able to control when you pee or poop (incontinence).  Being female. If you are female, these things can increase the risk: ? Using these methods to prevent pregnancy:  A medicine that kills sperm (spermicide).  A device that blocks sperm (diaphragm). ? Having low levels of a female hormone (estrogen). ? Being pregnant. You are more likely to develop this condition if:  You have genes that add to your risk.  You are sexually active.  You take antibiotic medicines.  You have trouble peeing because of: ? A prostate that is bigger than normal, if you are female. ? A blockage in the part of your body that drains pee from the bladder. ? A kidney stone. ? A nerve condition that affects your bladder. ? Not getting enough to drink. ? Not peeing often enough.  You have other conditions, such as: ? Diabetes. ? A weak disease-fighting system (immune system). ? Sickle cell disease. ? Gout. ? Injury of the spine. What are the signs or symptoms? Symptoms of this condition include:  Needing to pee right away.  Peeing small amounts often.  Pain or burning when peeing.  Blood in the pee.  Pee that smells bad or not like normal.  Trouble peeing.  Pee that is cloudy.  Fluid coming from the vagina, if you are female.  Pain in the belly or lower back. Other symptoms include:  Vomiting.  Not  feeling hungry.  Feeling mixed up (confused). This may be the first symptom in older adults.  Being tired and grouchy (irritable).  A fever.  Watery poop (diarrhea). How is this treated?  Taking antibiotic medicine.  Taking other medicines.  Drinking enough water. In some cases, you may need to see a specialist. Follow these instructions at home: Medicines  Take over-the-counter and prescription medicines only as told by your doctor.  If you were prescribed an antibiotic medicine, take it as told by your doctor. Do not stop taking it even if you start to feel better. General instructions  Make sure you: ? Pee until your bladder is empty. ? Do not hold pee for a long time. ? Empty your bladder after sex. ? Wipe from front to back after peeing or pooping if you are a female. Use each tissue one time when you wipe.  Drink enough fluid to keep your pee pale yellow.  Keep all follow-up visits.   Contact a doctor if:  You do not get better after 1-2 days.  Your symptoms go away and then come back. Get help right away if:  You have very bad back pain.  You have very bad pain in your lower belly.  You have a fever.  You have chills.  You feeling like you will vomit or you vomit. Summary  A urinary tract infection (UTI) is an infection of any part of the urinary tract.  This condition is caused by   germs in your genital area.  There are many risk factors for a UTI.  Treatment includes antibiotic medicines.  Drink enough fluid to keep your pee pale yellow. This information is not intended to replace advice given to you by your health care provider. Make sure you discuss any questions you have with your health care provider. Document Revised: 02/25/2020 Document Reviewed: 02/25/2020 Elsevier Patient Education  2021 Elsevier Inc.  

## 2020-10-23 NOTE — Progress Notes (Signed)
Urological Symptom Review  Patient is experiencing the following symptoms: Hard to postpone urination Get up at night to urinate   Review of Systems  Gastrointestinal (upper)  : Negative for upper GI symptoms  Gastrointestinal (lower) : Negative for lower GI symptoms  Constitutional : Weight loss  Skin: Negative for skin symptoms  Eyes: Negative for eye symptoms  Ear/Nose/Throat : Negative for Ear/Nose/Throat symptoms  Hematologic/Lymphatic: Negative for Hematologic/Lymphatic symptoms  Cardiovascular : Negative for cardiovascular symptoms  Respiratory : Negative for respiratory symptoms  Endocrine: Negative for endocrine symptoms  Musculoskeletal: Negative for musculoskeletal symptoms  Neurological: Negative for neurological symptoms  Psychologic: Negative for psychiatric symptoms

## 2020-10-26 DIAGNOSIS — N281 Cyst of kidney, acquired: Secondary | ICD-10-CM | POA: Diagnosis not present

## 2020-10-26 DIAGNOSIS — N200 Calculus of kidney: Secondary | ICD-10-CM | POA: Diagnosis not present

## 2020-11-01 DIAGNOSIS — E782 Mixed hyperlipidemia: Secondary | ICD-10-CM | POA: Diagnosis not present

## 2020-11-01 DIAGNOSIS — R739 Hyperglycemia, unspecified: Secondary | ICD-10-CM | POA: Diagnosis not present

## 2020-11-01 DIAGNOSIS — R748 Abnormal levels of other serum enzymes: Secondary | ICD-10-CM | POA: Diagnosis not present

## 2020-11-01 DIAGNOSIS — K2100 Gastro-esophageal reflux disease with esophagitis, without bleeding: Secondary | ICD-10-CM | POA: Diagnosis not present

## 2020-11-01 DIAGNOSIS — I10 Essential (primary) hypertension: Secondary | ICD-10-CM | POA: Diagnosis not present

## 2020-11-01 DIAGNOSIS — E7800 Pure hypercholesterolemia, unspecified: Secondary | ICD-10-CM | POA: Diagnosis not present

## 2020-11-01 DIAGNOSIS — E039 Hypothyroidism, unspecified: Secondary | ICD-10-CM | POA: Diagnosis not present

## 2020-11-01 DIAGNOSIS — E7841 Elevated Lipoprotein(a): Secondary | ICD-10-CM | POA: Diagnosis not present

## 2020-11-16 DIAGNOSIS — E039 Hypothyroidism, unspecified: Secondary | ICD-10-CM | POA: Diagnosis not present

## 2020-11-16 DIAGNOSIS — I10 Essential (primary) hypertension: Secondary | ICD-10-CM | POA: Diagnosis not present

## 2020-11-16 DIAGNOSIS — Z1212 Encounter for screening for malignant neoplasm of rectum: Secondary | ICD-10-CM | POA: Diagnosis not present

## 2020-11-16 DIAGNOSIS — E871 Hypo-osmolality and hyponatremia: Secondary | ICD-10-CM | POA: Diagnosis not present

## 2020-11-16 DIAGNOSIS — E7849 Other hyperlipidemia: Secondary | ICD-10-CM | POA: Diagnosis not present

## 2020-11-16 DIAGNOSIS — G3184 Mild cognitive impairment, so stated: Secondary | ICD-10-CM | POA: Diagnosis not present

## 2020-11-16 DIAGNOSIS — N183 Chronic kidney disease, stage 3 unspecified: Secondary | ICD-10-CM | POA: Diagnosis not present

## 2020-11-16 DIAGNOSIS — N200 Calculus of kidney: Secondary | ICD-10-CM | POA: Diagnosis not present

## 2020-11-22 ENCOUNTER — Telehealth: Payer: Self-pay

## 2020-11-22 NOTE — Telephone Encounter (Signed)
Patient is sending a fax on Thursday for Dr. Alyson Ingles to look at. Dr.McKenzie wanted to know the results.  Thanks, Helene Kelp

## 2020-11-23 ENCOUNTER — Telehealth: Payer: Self-pay

## 2020-11-23 DIAGNOSIS — Z6821 Body mass index (BMI) 21.0-21.9, adult: Secondary | ICD-10-CM | POA: Diagnosis not present

## 2020-11-23 DIAGNOSIS — N390 Urinary tract infection, site not specified: Secondary | ICD-10-CM | POA: Diagnosis not present

## 2020-11-23 NOTE — Telephone Encounter (Signed)
See prior note

## 2020-11-23 NOTE — Telephone Encounter (Signed)
Ok, thank you! It just need to be scanned into her chart.

## 2020-12-05 DIAGNOSIS — H9201 Otalgia, right ear: Secondary | ICD-10-CM | POA: Diagnosis not present

## 2020-12-05 DIAGNOSIS — H9113 Presbycusis, bilateral: Secondary | ICD-10-CM | POA: Diagnosis not present

## 2020-12-05 DIAGNOSIS — Z974 Presence of external hearing-aid: Secondary | ICD-10-CM | POA: Diagnosis not present

## 2020-12-05 DIAGNOSIS — H938X3 Other specified disorders of ear, bilateral: Secondary | ICD-10-CM | POA: Diagnosis not present

## 2020-12-28 DIAGNOSIS — H401134 Primary open-angle glaucoma, bilateral, indeterminate stage: Secondary | ICD-10-CM | POA: Diagnosis not present

## 2021-01-03 DIAGNOSIS — R109 Unspecified abdominal pain: Secondary | ICD-10-CM | POA: Diagnosis not present

## 2021-01-03 DIAGNOSIS — Z6822 Body mass index (BMI) 22.0-22.9, adult: Secondary | ICD-10-CM | POA: Diagnosis not present

## 2021-01-03 DIAGNOSIS — K219 Gastro-esophageal reflux disease without esophagitis: Secondary | ICD-10-CM | POA: Diagnosis not present

## 2021-01-08 DIAGNOSIS — R109 Unspecified abdominal pain: Secondary | ICD-10-CM | POA: Diagnosis not present

## 2021-01-08 DIAGNOSIS — N281 Cyst of kidney, acquired: Secondary | ICD-10-CM | POA: Diagnosis not present

## 2021-01-08 DIAGNOSIS — I700 Atherosclerosis of aorta: Secondary | ICD-10-CM | POA: Diagnosis not present

## 2021-01-30 DIAGNOSIS — R350 Frequency of micturition: Secondary | ICD-10-CM | POA: Diagnosis not present

## 2021-01-30 DIAGNOSIS — N3090 Cystitis, unspecified without hematuria: Secondary | ICD-10-CM | POA: Diagnosis not present

## 2021-01-30 DIAGNOSIS — R1310 Dysphagia, unspecified: Secondary | ICD-10-CM | POA: Diagnosis not present

## 2021-01-30 DIAGNOSIS — R059 Cough, unspecified: Secondary | ICD-10-CM | POA: Diagnosis not present

## 2021-01-30 DIAGNOSIS — R300 Dysuria: Secondary | ICD-10-CM | POA: Diagnosis not present

## 2021-01-30 DIAGNOSIS — Z7689 Persons encountering health services in other specified circumstances: Secondary | ICD-10-CM | POA: Diagnosis not present

## 2021-01-30 DIAGNOSIS — Z6822 Body mass index (BMI) 22.0-22.9, adult: Secondary | ICD-10-CM | POA: Diagnosis not present

## 2021-01-31 ENCOUNTER — Ambulatory Visit: Payer: Medicare Other | Admitting: Urology

## 2021-02-01 ENCOUNTER — Encounter (INDEPENDENT_AMBULATORY_CARE_PROVIDER_SITE_OTHER): Payer: Self-pay | Admitting: *Deleted

## 2021-02-05 DIAGNOSIS — Z1231 Encounter for screening mammogram for malignant neoplasm of breast: Secondary | ICD-10-CM | POA: Diagnosis not present

## 2021-02-06 ENCOUNTER — Encounter: Payer: Self-pay | Admitting: Urology

## 2021-02-06 ENCOUNTER — Other Ambulatory Visit: Payer: Self-pay

## 2021-02-06 ENCOUNTER — Ambulatory Visit: Payer: Medicare Other | Admitting: Urology

## 2021-02-06 VITALS — BP 170/64 | HR 58 | Temp 98.4°F | Wt 127.2 lb

## 2021-02-06 DIAGNOSIS — N3021 Other chronic cystitis with hematuria: Secondary | ICD-10-CM | POA: Diagnosis not present

## 2021-02-06 DIAGNOSIS — N200 Calculus of kidney: Secondary | ICD-10-CM | POA: Diagnosis not present

## 2021-02-06 DIAGNOSIS — R351 Nocturia: Secondary | ICD-10-CM

## 2021-02-06 LAB — URINALYSIS, ROUTINE W REFLEX MICROSCOPIC
Bilirubin, UA: NEGATIVE
Glucose, UA: NEGATIVE
Ketones, UA: NEGATIVE
Nitrite, UA: NEGATIVE
Protein,UA: NEGATIVE
RBC, UA: NEGATIVE
Specific Gravity, UA: 1.01 (ref 1.005–1.030)
Urobilinogen, Ur: 0.2 mg/dL (ref 0.2–1.0)
pH, UA: 5.5 (ref 5.0–7.5)

## 2021-02-06 LAB — MICROSCOPIC EXAMINATION
Epithelial Cells (non renal): NONE SEEN /hpf (ref 0–10)
RBC, Urine: NONE SEEN /hpf (ref 0–2)
Renal Epithel, UA: NONE SEEN /hpf

## 2021-02-06 MED ORDER — TRIMETHOPRIM 100 MG PO TABS: 100.0000 mg | ORAL_TABLET | Freq: Every evening | ORAL | 6 refills | Status: DC

## 2021-02-06 NOTE — Patient Instructions (Signed)
Urinary Tract Infection, Adult A urinary tract infection (UTI) is an infection of any part of the urinary tract. The urinary tract includes the kidneys, ureters, bladder, and urethra. These organs make, store, and get rid of urine in the body. An upper UTI affects the ureters and kidneys. A lower UTI affects the bladder and urethra. What are the causes? Most urinary tract infections are caused by bacteria in your genital area around your urethra, where urine leaves your body. These bacteria grow and cause inflammation of your urinary tract. What increases the risk? You are more likely to develop this condition if: You have a urinary catheter that stays in place. You are not able to control when you urinate or have a bowel movement (incontinence). You are female and you: Use a spermicide or diaphragm for birth control. Have low estrogen levels. Are pregnant. You have certain genes that increase your risk. You are sexually active. You take antibiotic medicines. You have a condition that causes your flow of urine to slow down, such as: An enlarged prostate, if you are female. Blockage in your urethra. A kidney stone. A nerve condition that affects your bladder control (neurogenic bladder). Not getting enough to drink, or not urinating often. You have certain medical conditions, such as: Diabetes. A weak disease-fighting system (immunesystem). Sickle cell disease. Gout. Spinal cord injury. What are the signs or symptoms? Symptoms of this condition include: Needing to urinate right away (urgency). Frequent urination. This may include small amounts of urine each time you urinate. Pain or burning with urination. Blood in the urine. Urine that smells bad or unusual. Trouble urinating. Cloudy urine. Vaginal discharge, if you are female. Pain in the abdomen or the lower back. You may also have: Vomiting or a decreased appetite. Confusion. Irritability or tiredness. A fever or  chills. Diarrhea. The first symptom in older adults may be confusion. In some cases, they may not have any symptoms until the infection has worsened. How is this diagnosed? This condition is diagnosed based on your medical history and a physical exam. You may also have other tests, including: Urine tests. Blood tests. Tests for STIs (sexually transmitted infections). If you have had more than one UTI, a cystoscopy or imaging studies may be done to determine the cause of the infections. How is this treated? Treatment for this condition includes: Antibiotic medicine. Over-the-counter medicines to treat discomfort. Drinking enough water to stay hydrated. If you have frequent infections or have other conditions such as a kidney stone, you may need to see a health care provider who specializes in the urinary tract (urologist). In rare cases, urinary tract infections can cause sepsis. Sepsis is a life-threatening condition that occurs when the body responds to an infection. Sepsis is treated in the hospital with IV antibiotics, fluids, and other medicines. Follow these instructions at home: Medicines Take over-the-counter and prescription medicines only as told by your health care provider. If you were prescribed an antibiotic medicine, take it as told by your health care provider. Do not stop using the antibiotic even if you start to feel better. General instructions Make sure you: Empty your bladder often and completely. Do not hold urine for long periods of time. Empty your bladder after sex. Wipe from front to back after urinating or having a bowel movement if you are female. Use each tissue only one time when you wipe. Drink enough fluid to keep your urine pale yellow. Keep all follow-up visits. This is important. Contact a health care provider   if: Your symptoms do not get better after 1-2 days. Your symptoms go away and then return. Get help right away if: You have severe pain in your  back or your lower abdomen. You have a fever or chills. You have nausea or vomiting. Summary A urinary tract infection (UTI) is an infection of any part of the urinary tract, which includes the kidneys, ureters, bladder, and urethra. Most urinary tract infections are caused by bacteria in your genital area. Treatment for this condition often includes antibiotic medicines. If you were prescribed an antibiotic medicine, take it as told by your health care provider. Do not stop using the antibiotic even if you start to feel better. Keep all follow-up visits. This is important. This information is not intended to replace advice given to you by your health care provider. Make sure you discuss any questions you have with your health care provider. Document Revised: 02/25/2020 Document Reviewed: 02/25/2020 Elsevier Patient Education  2022 Elsevier Inc.  

## 2021-02-06 NOTE — Progress Notes (Signed)
02/06/2021 3:32 PM   Rebekah King [birth year] [id number]  Referring provider: Manon Hilding, MD [address],  [place] [address]  Followup recurrent UTI   HPI: Rebekah King is a 80yo here for followup for recurrent UTI. She has just finished keflex for a UTI.  She has had 3 UTIs since last visit. She underwent renal US 3/31 which showed a 68mm right lower calculus. She has intermittent bilateral flank pain that is sharp, mild and nonraditing. She denies any worsening LUTS.  Nocturia stable at 2-3x with fluid management.  Her records from [facility] are as follows: I have chronic cystitis.  HPI: Rebekah King is a 80 year-old female established patient who is here for chronic cystitis.  She does not have a burning sensation when she urinates. She does not have to strain or bear down to start her urinary stream. She is not having problems getting her urine stream started. She is not currently having trouble urinating. She is not having problems with emptying her bladder well.   She is having problems with urinary control or incontinence. She is urinating more frequently now than usual.   She does have pelvic or rectal pain related to voiding.   04/23/2017: Since she was a teenager she has had episodes of urinary frequency, urgency, dysuria and urinary incontinence. She has been treated 4-5x for a UTI. Her symptoms usually improve with antibiotics.  Last week she had urinary frequency, urgency, dysuria and urge incontinence which then resolved without treatment. UA today shows trace blood and +1 leukocytes. No pain with intercourse. She has a daily BM.    06/11/2017: She notes resolution in the urgency, frequency and nocturia. No dysuria. She is using the valium prn   09/10/2017: 4 days ago she developed severe dysuria, urgency and frequency and took macrodantin which resolved her symptoms. She tried to take valium for the symptoms which failed to resolve the symptoms.   12/10/2017: No UTIs  since last visit. She is on macrodantin 50mg  qhs. No New LUTS   08/05/2018: NO UTIs since last visit. no dysuria. no hematuria. she is on macrodantin 50mg  qhs     CC: I get up too often at night to urinate.  HPI: She first noticed the symptom approximately 11/27/2011. She usually gets up at night to urinate 1 time. She does not have nights when she does not get up to urinate at all. She does not have trouble falling back asleep once she has been woken up at night.   She does not usually have swelling in her hands and feet during the day. She does not take a diuretic. She does not have to strain or bear down to start her urinary stream.   12/10/2017: No caffeine after 3pm. no fluid within 2 hours of bedtime. No OSA.   08/05/2018: nocturia is stable at 1x.        PMH: Past Medical History:  Diagnosis Date   Arthritis    Complication of anesthesia    CLAUSTROPHOBIC- TROUBLE WITH ANYTHING OVER FACE   GERD (gastroesophageal reflux disease)    Glaucoma    Hypothyroidism    UTI (urinary tract infection)     Surgical History: Past Surgical History:  Procedure Laterality Date   ABDOMINAL HYSTERECTOMY     CATARACT EXTRACTION W/ INTRAOCULAR LENS  IMPLANT, BILATERAL     HERNIA REPAIR     HIP FRACTURE SURGERY      RIGHT    (2ND SURGERY TO  REMOVE PINS)   REFRACTIVE SURGERY     RETINAL DETACHMENT SURGERY     RIGHT   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   Procedure: TOTAL KNEE ARTHROPLASTY;  Surgeon: Earlie Server, MD;  Location: [facility];  Service: Orthopedics;  Laterality: Right;   TOTAL SHOULDER ARTHROPLASTY Right 11/25/2019   Procedure: TOTAL SHOULDER ARTHROPLASTY;  Surgeon: Justice Britain, MD;  Location: [facility];  Service: Orthopedics;  Laterality: Right;  176min    Home Medications:  Allergies as of 02/06/2021       Reactions   Brimonidine Other (See Comments)   Severe dry mouth   Timolol Other (See Comments)   Lightheaded   Amoxicillin-pot Clavulanate Diarrhea   GI pain Has  patient had a PCN reaction causing immediate rash, facial/tongue/throat swelling, SOB or lightheadedness with hypotension: No Has patient had a PCN reaction causing severe rash involving mucus membranes or skin necrosis: No Has patient had a PCN reaction that required hospitalization: No Has patient had a PCN reaction occurring within the last 10 years: No If all of the above answers are "NO", then may proceed with Cephalosporin use.   Sulfa Antibiotics Nausea Only, Rash   Hands go numb        Medication List        Accurate as of February 06, 2021  3:32 PM. If you have any questions, ask your nurse or doctor.          CALCIUM 600+D3 PO Take 1 tablet by mouth in the morning and at bedtime.   dorzolamide 2 % ophthalmic solution Commonly known as: TRUSOPT Place 1 drop into both eyes 3 (three) times daily.   famotidine 20 MG tablet Commonly known as: PEPCID Take 20 mg by mouth 2 (two) times daily.   latanoprost 0.005 % ophthalmic solution Commonly known as: XALATAN Place 1 drop into both eyes at bedtime.   levothyroxine 50 MCG tablet Commonly known as: SYNTHROID Take 50 mcg by mouth daily before breakfast.   losartan-hydrochlorothiazide 50-12.5 MG tablet Commonly known as: HYZAAR Take 1 tablet by mouth daily.   multivitamin with minerals Tabs tablet Take 1 tablet by mouth daily.   nitrofurantoin 50 MG capsule Commonly known as: MACRODANTIN Take 1 capsule (50 mg total) by mouth at bedtime. Take 1 capsule (50 mg total) by mouth at bedtime.   nitrofurantoin 50 MG capsule Commonly known as: MACRODANTIN Take 1 capsule (50 mg total) by mouth at bedtime.   simvastatin 20 MG tablet Commonly known as: ZOCOR Take 20 mg by mouth every evening.   vitamin C with rose hips 500 MG tablet Take 500 mg by mouth daily.        Allergies:  Allergies  Allergen Reactions   Brimonidine Other (See Comments)    Severe dry mouth   Timolol Other (See Comments)    Lightheaded    Amoxicillin-Pot Clavulanate Diarrhea    GI pain Has patient had a PCN reaction causing immediate rash, facial/tongue/throat swelling, SOB or lightheadedness with hypotension: No Has patient had a PCN reaction causing severe rash involving mucus membranes or skin necrosis: No Has patient had a PCN reaction that required hospitalization: No Has patient had a PCN reaction occurring within the last 10 years: No If all of the above answers are "NO", then may proceed with Cephalosporin use.    Sulfa Antibiotics Nausea Only and Rash    Hands go numb    Family History: No family history on file.  Social History:  reports that she  has never smoked. She has never used smokeless tobacco. She reports that she does not drink alcohol and does not use drugs.  ROS: All other review of systems were reviewed and are negative except what is noted above in HPI  Physical Exam: BP (!) 170/64   Pulse (!) 58   Temp 98.4 F (36.9 C)   Wt 127 lb 3.2 oz (57.7 kg)   BMI 23.27 kg/m   Constitutional:  Alert and oriented, No acute distress. HEENT: [place] AT, moist mucus membranes.  Trachea midline, no masses. Cardiovascular: No clubbing, cyanosis, or edema. Respiratory: Normal respiratory effort, no increased work of breathing. GI: Abdomen is soft, nontender, nondistended, no abdominal masses GU: No CVA tenderness.  Lymph: No cervical or inguinal lymphadenopathy. Skin: No rashes, bruises or suspicious lesions. Neurologic: Grossly intact, no focal deficits, moving all 4 extremities. Psychiatric: Normal mood and affect.  Laboratory Data: Lab Results  Component Value Date   WBC 7.1 11/18/2019   HGB 11.8 (L) 11/18/2019   HCT 35.5 (L) 11/18/2019   MCV 96.7 11/18/2019   PLT 209 11/18/2019    Lab Results  Component Value Date   CREATININE 0.91 11/18/2019    No results found for: PSA  No results found for: TESTOSTERONE  No results found for: HGBA1C  Urinalysis    Component Value Date/Time    COLORURINE YELLOW 10/07/2017 1310   APPEARANCEUR Clear 10/23/2020 1144   LABSPEC 1.006 10/07/2017 1310   PHURINE 6.0 10/07/2017 1310   GLUCOSEU Negative 10/23/2020 1144   HGBUR NEGATIVE 10/07/2017 1310   BILIRUBINUR Negative 10/23/2020 1144   KETONESUR NEGATIVE 10/07/2017 1310   PROTEINUR Negative 10/23/2020 1144   PROTEINUR NEGATIVE 10/07/2017 1310   UROBILINOGEN negative (A) 09/01/2019 1100   NITRITE Negative 10/23/2020 1144   NITRITE NEGATIVE 10/07/2017 1310   LEUKOCYTESUR Negative 10/23/2020 1144    Lab Results  Component Value Date   LABMICR Comment 10/23/2020   WBCUA 6-10 (A) 08/23/2020   LABEPIT 0-10 08/23/2020   MUCUS Present 08/23/2020   BACTERIA Moderate (A) 08/23/2020    Pertinent Imaging:  No results found for this or any previous visit.  No results found for this or any previous visit.  No results found for this or any previous visit.  No results found for this or any previous visit.  No results found for this or any previous visit.  No results found for this or any previous visit.  No results found for this or any previous visit.  No results found for this or any previous visit.   Assessment & Plan:    1. Chronic cystitis with hematuria -We will trial trimethoprim 100mg  QHS  2. Nocturia -fluid management prior to going to bed   No follow-ups on file.  Nicolette Bang, MD  [facility] [place]

## 2021-02-06 NOTE — Addendum Note (Signed)
Addended byIris Pert on: 02/06/2021 04:04 PM   Modules accepted: Orders

## 2021-02-06 NOTE — Progress Notes (Signed)
Urological Symptom Review  Patient is experiencing the following symptoms: Get up at night to urinate UTI  Review of Systems  Gastrointestinal (upper)  : Negative for upper GI symptoms  Gastrointestinal (lower) : Negative for lower GI symptoms  Constitutional : Weight loss  Skin: Negative for skin symptoms  Eyes: Negative for eye symptoms  Ear/Nose/Throat : Sinus problems  Hematologic/Lymphatic: Negative for Hematologic/Lymphatic symptoms  Cardiovascular : Negative for cardiovascular symptoms  Respiratory : Cough  Endocrine: Negative for endocrine symptoms  Musculoskeletal: Back pain  Neurological: Negative for neurological symptoms  Psychologic: Negative for psychiatric symptoms

## 2021-02-07 DIAGNOSIS — H401133 Primary open-angle glaucoma, bilateral, severe stage: Secondary | ICD-10-CM | POA: Diagnosis not present

## 2021-02-28 DIAGNOSIS — Z6822 Body mass index (BMI) 22.0-22.9, adult: Secondary | ICD-10-CM | POA: Diagnosis not present

## 2021-02-28 DIAGNOSIS — R109 Unspecified abdominal pain: Secondary | ICD-10-CM | POA: Diagnosis not present

## 2021-03-15 DIAGNOSIS — R300 Dysuria: Secondary | ICD-10-CM | POA: Diagnosis not present

## 2021-03-15 DIAGNOSIS — N3090 Cystitis, unspecified without hematuria: Secondary | ICD-10-CM | POA: Diagnosis not present

## 2021-03-15 DIAGNOSIS — Z6821 Body mass index (BMI) 21.0-21.9, adult: Secondary | ICD-10-CM | POA: Diagnosis not present

## 2021-03-15 DIAGNOSIS — Z7689 Persons encountering health services in other specified circumstances: Secondary | ICD-10-CM | POA: Diagnosis not present

## 2021-03-25 ENCOUNTER — Other Ambulatory Visit: Payer: Self-pay

## 2021-03-25 ENCOUNTER — Emergency Department (HOSPITAL_COMMUNITY): Payer: Medicare Other

## 2021-03-25 ENCOUNTER — Encounter (HOSPITAL_COMMUNITY): Payer: Self-pay | Admitting: Emergency Medicine

## 2021-03-25 ENCOUNTER — Emergency Department (HOSPITAL_COMMUNITY)
Admission: EM | Admit: 2021-03-25 | Discharge: 2021-03-25 | Disposition: A | Discharge status: Home / Self Care | Payer: Medicare Other | Attending: Emergency Medicine | Admitting: Emergency Medicine

## 2021-03-25 DIAGNOSIS — R399 Unspecified symptoms and signs involving the genitourinary system: Secondary | ICD-10-CM | POA: Insufficient documentation

## 2021-03-25 DIAGNOSIS — E039 Hypothyroidism, unspecified: Secondary | ICD-10-CM | POA: Insufficient documentation

## 2021-03-25 DIAGNOSIS — E871 Hypo-osmolality and hyponatremia: Secondary | ICD-10-CM | POA: Diagnosis not present

## 2021-03-25 DIAGNOSIS — E860 Dehydration: Secondary | ICD-10-CM

## 2021-03-25 DIAGNOSIS — Z20822 Contact with and (suspected) exposure to covid-19: Secondary | ICD-10-CM | POA: Diagnosis not present

## 2021-03-25 DIAGNOSIS — R55 Syncope and collapse: Secondary | ICD-10-CM | POA: Diagnosis not present

## 2021-03-25 DIAGNOSIS — I517 Cardiomegaly: Secondary | ICD-10-CM | POA: Diagnosis not present

## 2021-03-25 DIAGNOSIS — R531 Weakness: Secondary | ICD-10-CM | POA: Diagnosis not present

## 2021-03-25 LAB — BASIC METABOLIC PANEL
Anion gap: 5 (ref 5–15)
BUN: 26 mg/dL — ABNORMAL HIGH (ref 8–23)
CO2: 21 mmol/L — ABNORMAL LOW (ref 22–32)
Calcium: 8.6 mg/dL — ABNORMAL LOW (ref 8.9–10.3)
Chloride: 100 mmol/L (ref 98–111)
Creatinine, Ser: 1.47 mg/dL — ABNORMAL HIGH (ref 0.44–1.00)
GFR, Estimated: 37 mL/min — ABNORMAL LOW (ref >60)
Glucose, Bld: 119 mg/dL — ABNORMAL HIGH (ref 70–99)
Potassium: 3.5 mmol/L (ref 3.5–5.1)
Sodium: 126 mmol/L — ABNORMAL LOW (ref 135–145)

## 2021-03-25 LAB — URINALYSIS, ROUTINE W REFLEX MICROSCOPIC
Bilirubin Urine: NEGATIVE
Glucose, UA: NEGATIVE mg/dL
Ketones, ur: NEGATIVE mg/dL
Leukocytes,Ua: NEGATIVE
Nitrite: POSITIVE — AB
Protein, ur: NEGATIVE mg/dL
Specific Gravity, Urine: 1.009 (ref 1.005–1.030)
pH: 5 (ref 5.0–8.0)

## 2021-03-25 LAB — TROPONIN I (HIGH SENSITIVITY)
Troponin I (High Sensitivity): 3 ng/L (ref <18)
Troponin I (High Sensitivity): 3 ng/L (ref <18)

## 2021-03-25 LAB — CBC
HCT: 32.2 % — ABNORMAL LOW (ref 36.0–46.0)
Hemoglobin: 10.9 g/dL — ABNORMAL LOW (ref 12.0–15.0)
MCH: 34 pg (ref 26.0–34.0)
MCHC: 33.9 g/dL (ref 30.0–36.0)
MCV: 100.3 fL — ABNORMAL HIGH (ref 80.0–100.0)
Platelets: 266 10*3/uL (ref 150–400)
RBC: 3.21 MIL/uL — ABNORMAL LOW (ref 3.87–5.11)
RDW: 12.1 % (ref 11.5–15.5)
WBC: 13.3 10*3/uL — ABNORMAL HIGH (ref 4.0–10.5)
nRBC: 0 % (ref 0.0–0.2)

## 2021-03-25 LAB — RESP PANEL BY RT-PCR (FLU A&B, COVID) ARPGX2
Influenza A by PCR: NEGATIVE
Influenza B by PCR: NEGATIVE
SARS Coronavirus 2 by RT PCR: NEGATIVE

## 2021-03-25 MED ORDER — SODIUM CHLORIDE 0.9 % IV BOLUS
1000.0000 mL | Freq: Once | INTRAVENOUS | Status: AC
  Administered 2021-03-25: 1000 mL via INTRAVENOUS

## 2021-03-25 NOTE — ED Notes (Signed)
Pt ambulated around the room with no episodes of dizziness or lightheadedness.

## 2021-03-25 NOTE — Discharge Instructions (Addendum)
You were seen in the emergency department for evaluation of fainting spells.  Your lab work showed you to be dehydrated and your sodium was low.  You need to drink more electrolyte containing solution such as Gatorade.  You will need repeat lab work by your primary care doctor.  Return to the emergency department if any worsening or concerning symptoms

## 2021-03-25 NOTE — ED Provider Notes (Signed)
[facility] EMERGENCY DEPARTMENT Provider Note   CSN: [id number] Arrival date & time: 03/25/21  1833     History Chief Complaint  Patient presents with   Near Syncope    Rebekah King is a 80 y.o. female.  She is here for evaluation of 2 episodes of near syncope today.  She said she felt very hot and nauseous and then had diarrhea.  It sounds like they lasted a minute or so.  She said she can still hear people.  Her vision became very dark when it happened.  She did not fall and did not have any injuries.  She said she had 1 prior episode of near syncope about 6 weeks ago.  She is also had some intermittent mild chest pain that does not seem to be related to her fainting spell.  She has not had any of that today.  The history is provided by the patient.  Near Syncope This is a new problem. The current episode started 1 to 2 hours ago. The problem has been resolved. Associated symptoms include chest pain. Pertinent negatives include no abdominal pain, no headaches and no shortness of breath. Nothing aggravates the symptoms. The symptoms are relieved by rest. She has tried rest for the symptoms. The treatment provided moderate relief.      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    Past Surgical History:  Procedure Laterality Date   ABDOMINAL HYSTERECTOMY     CATARACT EXTRACTION W/ INTRAOCULAR LENS  IMPLANT, BILATERAL     HERNIA REPAIR     HIP FRACTURE SURGERY      RIGHT    (2ND SURGERY TO REMOVE PINS)   REFRACTIVE SURGERY     RETINAL DETACHMENT SURGERY     RIGHT    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   Procedure: TOTAL KNEE ARTHROPLASTY;  Surgeon: Earlie Server, MD;  Location: [facility];  Service: Orthopedics;  Laterality: Right;   TOTAL SHOULDER ARTHROPLASTY Right 11/25/2019   Procedure: TOTAL SHOULDER ARTHROPLASTY;  Surgeon: Justice Britain, MD;  Location: [facility];  Service: Orthopedics;  Laterality: Right;  128mn     OB History   No obstetric history on file.     History reviewed. No pertinent family history.  Social History   Tobacco Use   Smoking status: Never   Smokeless tobacco: Never  Vaping Use   Vaping Use: Never used  Substance Use Topics   Alcohol use: No   Drug use: No    Home Medications Prior to Admission medications   Medication Sig Start Date End Date Taking? Authorizing Provider  Ascorbic Acid (VITAMIN C WITH ROSE HIPS) 500 MG tablet Take 500 mg by mouth daily.    [provider]  Calcium Carb-Cholecalciferol (CALCIUM 600+D3 PO) Take 1 tablet by mouth in the morning and at bedtime.    [provider]  dorzolamide (TRUSOPT) 2 % ophthalmic solution Place 1 drop into both eyes 3 (three) times daily.  08/27/19   [provider]  famotidine (PEPCID) 20 MG tablet Take 20  mg by mouth 2 (two) times daily. 08/27/19   [provider]  latanoprost (XALATAN) 0.005 % ophthalmic solution Place 1 drop into both eyes at bedtime.    [provider]  levothyroxine (SYNTHROID, LEVOTHROID) 50 MCG tablet Take 50 mcg by mouth daily before breakfast.    [provider]  losartan-hydrochlorothiazide (HYZAAR) 50-12.5 MG tablet Take 1 tablet by mouth daily. 08/31/20   [provider]  Multiple Vitamin (MULTIVITAMIN WITH MINERALS) TABS tablet Take 1 tablet by mouth daily.    [provider]  nitrofurantoin (MACRODANTIN) 50 MG capsule Take 1 capsule (50 mg total) by mouth at bedtime. Take 1 capsule (50 mg total) by mouth at bedtime. 10/03/20   McKenzie, Candee Furbish, MD  nitrofurantoin  (MACRODANTIN) 50 MG capsule Take 1 capsule (50 mg total) by mouth at bedtime. 10/23/20   McKenzie, Candee Furbish, MD  simvastatin (ZOCOR) 20 MG tablet Take 20 mg by mouth every evening.    [provider]  trimethoprim (TRIMPEX) 100 MG tablet Take 1 tablet (100 mg total) by mouth at bedtime. 02/06/21   McKenzie, Candee Furbish, MD    Allergies    Brimonidine, Timolol, Amoxicillin-pot clavulanate, and Sulfa antibiotics  Review of Systems   Review of Systems  Constitutional:  Negative for fever.  HENT:  Negative for sore throat.   Eyes:  Positive for visual disturbance.  Respiratory:  Negative for shortness of breath.   Cardiovascular:  Positive for chest pain and near-syncope.  Gastrointestinal:  Positive for diarrhea. Negative for abdominal pain.  Genitourinary:  Negative for dysuria.  Musculoskeletal:  Negative for neck pain.  Skin:  Negative for rash.  Neurological:  Positive for dizziness. Negative for headaches.   Physical Exam Updated Vital Signs BP (!) 138/58 (BP Location: Right Arm)   Pulse 72   Temp 98.2 F (36.8 C) (Oral)   Resp 17   Ht '5\' 2"'$  (1.575 m)   Wt 57.7 kg   SpO2 100%   BMI 23.27 kg/m   Physical Exam Vitals and nursing note reviewed.  Constitutional:      General: She is not in acute distress.    Appearance: Normal appearance. She is well-developed.  HENT:     Head: Normocephalic and atraumatic.  Eyes:     Conjunctiva/sclera: Conjunctivae normal.  Cardiovascular:     Rate and Rhythm: Normal rate and regular rhythm.     Heart sounds: No murmur heard. Pulmonary:     Effort: Pulmonary effort is normal. No respiratory distress.     Breath sounds: Normal breath sounds.  Abdominal:     Palpations: Abdomen is soft.     Tenderness: There is no abdominal tenderness. There is no guarding or rebound.  Musculoskeletal:        General: No deformity or signs of injury. Normal range of motion.     Cervical back: Neck supple.  Skin:    General: Skin is warm and  dry.  Neurological:     General: No focal deficit present.     Mental Status: She is alert and oriented to person, place, and time.     Cranial Nerves: No cranial nerve deficit.     Sensory: No sensory deficit.     Motor: No weakness.    ED Results / Procedures / Treatments   Labs (all labs ordered are listed, but only abnormal results are displayed) Labs Reviewed  BASIC METABOLIC PANEL - Abnormal; Notable for the following components:      Result Value  Sodium 126 (*)    CO2 21 (*)    Glucose, Bld 119 (*)    BUN 26 (*)    Creatinine, Ser 1.47 (*)    Calcium 8.6 (*)    GFR, Estimated 37 (*)    All other components within normal limits  CBC - Abnormal; Notable for the following components:   WBC 13.3 (*)    RBC 3.21 (*)    Hemoglobin 10.9 (*)    HCT 32.2 (*)    MCV 100.3 (*)    All other components within normal limits  URINALYSIS, ROUTINE W REFLEX MICROSCOPIC - Abnormal; Notable for the following components:   Color, Urine AMBER (*)    Hgb urine dipstick SMALL (*)    Nitrite POSITIVE (*)    Bacteria, UA RARE (*)    All other components within normal limits  RESP PANEL BY RT-PCR (FLU A&B, COVID) [id number]  URINE CULTURE  TROPONIN I (HIGH SENSITIVITY)  TROPONIN I (HIGH SENSITIVITY)    EKG EKG Interpretation  Date/Time:  Sunday March 25 2021 18:54:42 EDT Ventricular Rate:  68 PR Interval:  174 QRS Duration: 108 QT Interval:  398 QTC Calculation: 423 R Axis:   -70 Text Interpretation: Sinus rhythm with Premature atrial complexes Left anterior fascicular block Nonspecific T wave abnormality Abnormal ECG No significant change since prior 4/21 Confirmed by Aletta Edouard [phone]) on 03/25/2021 6:57:30 PM  Radiology DG Chest Port 1 View  Result Date: 03/25/2021 CLINICAL DATA:  Weakness. EXAM: PORTABLE CHEST 1 VIEW COMPARISON:  Chest radiograph dated 10/07/2017 FINDINGS: No focal consolidation, pleural effusion or pneumothorax. Mild cardiomegaly. Atherosclerotic  calcification of the aorta. Osteopenia with degenerative changes of the spine and shoulders. Right shoulder hemiarthroplasty no acute osseous pathology. IMPRESSION: No active cardiopulmonary disease. Electronically Signed   By: Anner Crete M.D.   On: 03/25/2021 20:38    Procedures Procedures   Medications Ordered in ED Medications  sodium chloride 0.9 % bolus 1,000 mL ( Intravenous Stopped 03/25/21 2246)    ED Course  I have reviewed the triage vital signs and the nursing notes.  Pertinent labs & imaging results that were available during my care of the patient were reviewed by me and considered in my medical decision making (see chart for details).  Clinical Course as of 03/26/21 [id number]  Nancy Fetter Mar 25, 2021  2036 Chest x-ray interpreted by me as no acute infiltrates.  Awaiting radiology reading. [MB]  2111 Reviewed labs with patient.  She said she knew she had a low sodium and her doctor has been following this.  She does not know what the values that she was running [MB]  2305 Patient feels better after IV fluids.  She is ambulated in the department.  She does not feel like she needs to be admitted.  As far as the urine goes she is already on antibiotics so we will hold off on changing this.  She understands she needs to hydrate better using electrolyte containing solutions.  We will need some repeat lab work by her doctor. [MB]    Clinical Course User Index [MB] Hayden Rasmussen, MD   MDM Rules/Calculators/A&P                          This patient complains of near syncope, diarrhea, chest pain; this involves an extensive number of treatment Options and is a complaint that carries with it a high risk of complications and Morbidity. The differential includes ACS,  arrhythmia, GI bleed, metabolic derangement, dehydration  I ordered, reviewed and interpreted labs, which included CBC with elevated white count, hemoglobin slightly lower than priors, chemistries with low sodium and  elevated creatinine reflecting some dehydration, urinalysis possible signs of infection nitrite positive, COVID and flu testing negative, troponins flat.  Urinalysis sent for culture.  She is currently on antibiotics for treatment of UTI. I ordered medication IV fluids I ordered imaging studies which included chest x-ray and I independently    visualized and interpreted imaging which showed no acute findings Additional history obtained from patient's husband Previous records obtained and reviewed in epic no recent admissions  After the interventions stated above, I reevaluated the patient and found patient to be symptomatically improved.  Her blood pressure has remained slightly elevated here.  She is tolerating p.o. and ambulating in the department without any difficulties.  I recommended admission for her low sodium and continued hydration.  Patient states she feels back at her baseline and feels she can be safely discharged.  Recommended close follow-up with her primary care doctor and will need repeat lab work.  Return instructions discussed   Final Clinical Impression(s) / ED Diagnoses Final diagnoses:  Near syncope  Dehydration  Hyponatremia    Rx / DC Orders ED Discharge Orders     None        Hayden Rasmussen, MD 03/26/21 [phone]

## 2021-03-25 NOTE — ED Triage Notes (Signed)
Pt states she has had several episodes of near syncope today.  Pt denies falling, but states she currently has a UTI.

## 2021-03-26 DIAGNOSIS — D1801 Hemangioma of skin and subcutaneous tissue: Secondary | ICD-10-CM | POA: Diagnosis not present

## 2021-03-26 DIAGNOSIS — D239 Other benign neoplasm of skin, unspecified: Secondary | ICD-10-CM | POA: Diagnosis not present

## 2021-03-26 DIAGNOSIS — L570 Actinic keratosis: Secondary | ICD-10-CM | POA: Diagnosis not present

## 2021-03-26 DIAGNOSIS — D485 Neoplasm of uncertain behavior of skin: Secondary | ICD-10-CM | POA: Diagnosis not present

## 2021-03-27 LAB — URINE CULTURE: Culture: NO GROWTH

## 2021-03-28 DIAGNOSIS — D509 Iron deficiency anemia, unspecified: Secondary | ICD-10-CM | POA: Diagnosis not present

## 2021-03-28 DIAGNOSIS — E871 Hypo-osmolality and hyponatremia: Secondary | ICD-10-CM | POA: Diagnosis not present

## 2021-03-28 DIAGNOSIS — D649 Anemia, unspecified: Secondary | ICD-10-CM | POA: Diagnosis not present

## 2021-04-09 DIAGNOSIS — H401322 Pigmentary glaucoma, left eye, moderate stage: Secondary | ICD-10-CM | POA: Diagnosis not present

## 2021-04-09 DIAGNOSIS — H31001 Unspecified chorioretinal scars, right eye: Secondary | ICD-10-CM | POA: Diagnosis not present

## 2021-04-09 DIAGNOSIS — H401313 Pigmentary glaucoma, right eye, severe stage: Secondary | ICD-10-CM | POA: Diagnosis not present

## 2021-04-09 DIAGNOSIS — G43109 Migraine with aura, not intractable, without status migrainosus: Secondary | ICD-10-CM | POA: Diagnosis not present

## 2021-04-16 DIAGNOSIS — R5383 Other fatigue: Secondary | ICD-10-CM | POA: Diagnosis not present

## 2021-04-16 DIAGNOSIS — Z6822 Body mass index (BMI) 22.0-22.9, adult: Secondary | ICD-10-CM | POA: Diagnosis not present

## 2021-04-16 DIAGNOSIS — K219 Gastro-esophageal reflux disease without esophagitis: Secondary | ICD-10-CM | POA: Diagnosis not present

## 2021-04-16 DIAGNOSIS — D649 Anemia, unspecified: Secondary | ICD-10-CM | POA: Diagnosis not present

## 2021-04-16 DIAGNOSIS — R059 Cough, unspecified: Secondary | ICD-10-CM | POA: Diagnosis not present

## 2021-04-16 DIAGNOSIS — E871 Hypo-osmolality and hyponatremia: Secondary | ICD-10-CM | POA: Diagnosis not present

## 2021-04-17 DIAGNOSIS — M545 Low back pain, unspecified: Secondary | ICD-10-CM | POA: Diagnosis not present

## 2021-04-17 DIAGNOSIS — M4120 Other idiopathic scoliosis, site unspecified: Secondary | ICD-10-CM | POA: Diagnosis not present

## 2021-04-17 DIAGNOSIS — M546 Pain in thoracic spine: Secondary | ICD-10-CM | POA: Diagnosis not present

## 2021-04-18 ENCOUNTER — Other Ambulatory Visit (HOSPITAL_COMMUNITY): Payer: Self-pay | Admitting: Physical Medicine and Rehabilitation

## 2021-04-18 DIAGNOSIS — M545 Low back pain, unspecified: Secondary | ICD-10-CM

## 2021-04-26 ENCOUNTER — Ambulatory Visit (HOSPITAL_COMMUNITY)
Admission: RE | Admit: 2021-04-26 | Discharge: 2021-04-26 | Disposition: A | Discharge status: Home / Self Care | Payer: Medicare Other | Source: Ambulatory Visit | Attending: Physical Medicine and Rehabilitation | Admitting: Physical Medicine and Rehabilitation

## 2021-04-26 ENCOUNTER — Other Ambulatory Visit: Payer: Self-pay

## 2021-04-26 DIAGNOSIS — M545 Low back pain, unspecified: Secondary | ICD-10-CM | POA: Diagnosis not present

## 2021-04-26 DIAGNOSIS — M5126 Other intervertebral disc displacement, lumbar region: Secondary | ICD-10-CM | POA: Diagnosis not present

## 2021-04-26 DIAGNOSIS — M47816 Spondylosis without myelopathy or radiculopathy, lumbar region: Secondary | ICD-10-CM | POA: Diagnosis not present

## 2021-04-30 DIAGNOSIS — M4120 Other idiopathic scoliosis, site unspecified: Secondary | ICD-10-CM | POA: Diagnosis not present

## 2021-05-04 DIAGNOSIS — M5416 Radiculopathy, lumbar region: Secondary | ICD-10-CM | POA: Diagnosis not present

## 2021-05-09 ENCOUNTER — Ambulatory Visit: Payer: Medicare Other | Admitting: Urology

## 2021-05-09 DIAGNOSIS — N3021 Other chronic cystitis with hematuria: Secondary | ICD-10-CM

## 2021-05-11 DIAGNOSIS — E039 Hypothyroidism, unspecified: Secondary | ICD-10-CM | POA: Diagnosis not present

## 2021-05-11 DIAGNOSIS — E7800 Pure hypercholesterolemia, unspecified: Secondary | ICD-10-CM | POA: Diagnosis not present

## 2021-05-11 DIAGNOSIS — R7301 Impaired fasting glucose: Secondary | ICD-10-CM | POA: Diagnosis not present

## 2021-05-11 DIAGNOSIS — E7849 Other hyperlipidemia: Secondary | ICD-10-CM | POA: Diagnosis not present

## 2021-05-11 DIAGNOSIS — E782 Mixed hyperlipidemia: Secondary | ICD-10-CM | POA: Diagnosis not present

## 2021-05-11 DIAGNOSIS — K2100 Gastro-esophageal reflux disease with esophagitis, without bleeding: Secondary | ICD-10-CM | POA: Diagnosis not present

## 2021-05-16 DIAGNOSIS — G3184 Mild cognitive impairment, so stated: Secondary | ICD-10-CM | POA: Diagnosis not present

## 2021-05-16 DIAGNOSIS — N3090 Cystitis, unspecified without hematuria: Secondary | ICD-10-CM | POA: Diagnosis not present

## 2021-05-16 DIAGNOSIS — I10 Essential (primary) hypertension: Secondary | ICD-10-CM | POA: Diagnosis not present

## 2021-05-16 DIAGNOSIS — E039 Hypothyroidism, unspecified: Secondary | ICD-10-CM | POA: Diagnosis not present

## 2021-05-16 DIAGNOSIS — N183 Chronic kidney disease, stage 3 unspecified: Secondary | ICD-10-CM | POA: Diagnosis not present

## 2021-05-16 DIAGNOSIS — E7849 Other hyperlipidemia: Secondary | ICD-10-CM | POA: Diagnosis not present

## 2021-05-16 DIAGNOSIS — E871 Hypo-osmolality and hyponatremia: Secondary | ICD-10-CM | POA: Diagnosis not present

## 2021-05-21 DIAGNOSIS — M4135 Thoracogenic scoliosis, thoracolumbar region: Secondary | ICD-10-CM | POA: Diagnosis not present

## 2021-05-21 DIAGNOSIS — M47895 Other spondylosis, thoracolumbar region: Secondary | ICD-10-CM | POA: Diagnosis not present

## 2021-05-24 DIAGNOSIS — M4135 Thoracogenic scoliosis, thoracolumbar region: Secondary | ICD-10-CM | POA: Diagnosis not present

## 2021-05-24 DIAGNOSIS — M47895 Other spondylosis, thoracolumbar region: Secondary | ICD-10-CM | POA: Diagnosis not present

## 2021-05-28 DIAGNOSIS — M5416 Radiculopathy, lumbar region: Secondary | ICD-10-CM | POA: Diagnosis not present

## 2021-05-31 DIAGNOSIS — M47895 Other spondylosis, thoracolumbar region: Secondary | ICD-10-CM | POA: Diagnosis not present

## 2021-05-31 DIAGNOSIS — M4135 Thoracogenic scoliosis, thoracolumbar region: Secondary | ICD-10-CM | POA: Diagnosis not present

## 2021-06-07 ENCOUNTER — Ambulatory Visit (INDEPENDENT_AMBULATORY_CARE_PROVIDER_SITE_OTHER): Payer: Medicare Other | Admitting: Gastroenterology

## 2021-06-07 DIAGNOSIS — N183 Chronic kidney disease, stage 3 unspecified: Secondary | ICD-10-CM | POA: Diagnosis not present

## 2021-06-07 DIAGNOSIS — D649 Anemia, unspecified: Secondary | ICD-10-CM | POA: Diagnosis not present

## 2021-06-07 DIAGNOSIS — R001 Bradycardia, unspecified: Secondary | ICD-10-CM | POA: Diagnosis not present

## 2021-06-07 DIAGNOSIS — I10 Essential (primary) hypertension: Secondary | ICD-10-CM | POA: Diagnosis not present

## 2021-06-07 DIAGNOSIS — Z0001 Encounter for general adult medical examination with abnormal findings: Secondary | ICD-10-CM | POA: Diagnosis not present

## 2021-06-07 DIAGNOSIS — M419 Scoliosis, unspecified: Secondary | ICD-10-CM | POA: Diagnosis not present

## 2021-06-07 DIAGNOSIS — M80012A Age-related osteoporosis with current pathological fracture, left shoulder, initial encounter for fracture: Secondary | ICD-10-CM | POA: Diagnosis not present

## 2021-06-27 ENCOUNTER — Ambulatory Visit (INDEPENDENT_AMBULATORY_CARE_PROVIDER_SITE_OTHER): Payer: Medicare Other | Admitting: Urology

## 2021-06-27 ENCOUNTER — Encounter: Payer: Self-pay | Admitting: Urology

## 2021-06-27 ENCOUNTER — Other Ambulatory Visit: Payer: Self-pay

## 2021-06-27 VITALS — BP 192/67 | HR 57

## 2021-06-27 DIAGNOSIS — N200 Calculus of kidney: Secondary | ICD-10-CM | POA: Diagnosis not present

## 2021-06-27 DIAGNOSIS — N3021 Other chronic cystitis with hematuria: Secondary | ICD-10-CM | POA: Diagnosis not present

## 2021-06-27 LAB — URINALYSIS, ROUTINE W REFLEX MICROSCOPIC
Bilirubin, UA: NEGATIVE
Glucose, UA: NEGATIVE
Ketones, UA: NEGATIVE
Leukocytes,UA: NEGATIVE
Nitrite, UA: NEGATIVE
Protein,UA: NEGATIVE
RBC, UA: NEGATIVE
Specific Gravity, UA: 1.01 (ref 1.005–1.030)
Urobilinogen, Ur: 0.2 mg/dL (ref 0.2–1.0)
pH, UA: 6 (ref 5.0–7.5)

## 2021-06-27 MED ORDER — CEFUROXIME AXETIL 250 MG PO TABS: 250.0000 mg | ORAL_TABLET | Freq: Two times a day (BID) | ORAL | 3 refills | Status: DC

## 2021-06-27 MED ORDER — NITROFURANTOIN MACROCRYSTAL 50 MG PO CAPS: 50.0000 mg | ORAL_CAPSULE | Freq: Every day | ORAL | 11 refills | Status: DC

## 2021-06-27 NOTE — Progress Notes (Signed)
06/27/2021 11:09 AM   Rebekah King 06-01-1941 [id number]  Referring provider: Manon Hilding, MD [address],  [place] [address]  Followup chronic cystitis   HPI: Ms Rebekah King is a 80yo here for followup for chronic cystitis.  She was switched to trimethoprim last visit and she got 3 UTIs in the past 2 months., She then restarted macrobid 50mg  and has not had a UTI since starting it 6 weeks ago. NO LUTS today. UA normal. 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 Sulfonamide Containing Compounds    Amoxicillin-pot Clavulanate Diarrhea   GI pain   Sulfa Antibiotics Nausea Only, Rash   Hands go numb        Medication List        Accurate as of King 30, 2022 11:09 AM. If you have any questions, ask your nurse or doctor.          STOP taking these medications    nitrofurantoin 50 MG  capsule Commonly known as: MACRODANTIN Stopped by: Nicolette Bang, MD       TAKE these medications    CALCIUM 600+D3 PO Take 1 tablet by mouth in the morning and at bedtime.   cephALEXin 250 MG capsule Commonly known as: KEFLEX Take 250 mg by mouth 3 (three) times daily.   dicyclomine 10 MG capsule Commonly known as: BENTYL Take 10 mg by mouth 4 (four) times daily.   dorzolamide 2 % ophthalmic solution Commonly known as: TRUSOPT Place 1 drop into both eyes 3 (three) times daily.   famotidine 20 MG tablet Commonly known as: PEPCID Take 20 mg by mouth 2 (two) times daily.   latanoprost 0.005 % ophthalmic solution Commonly known as: XALATAN Place 1 drop into both eyes at bedtime.   levothyroxine 50 MCG tablet Commonly known as: SYNTHROID Take 50 mcg by mouth daily before breakfast.   losartan 100 MG tablet Commonly known as: COZAAR Take 100 mg by mouth daily.   losartan-hydrochlorothiazide 50-12.5 MG tablet Commonly known as: HYZAAR Take 1 tablet by mouth daily.   meloxicam 15 MG tablet Commonly known as: MOBIC Take 15 mg by mouth daily.   multivitamin with minerals Tabs tablet Take 1 tablet by mouth daily.   pantoprazole  40 MG tablet Commonly known as: PROTONIX Take 40 mg by mouth daily.   simvastatin 20 MG tablet Commonly known as: ZOCOR Take 20 mg by mouth every evening.   trimethoprim 100 MG tablet Commonly known as: TRIMPEX Take 1 tablet (100 mg total) by mouth at bedtime.   vitamin C with rose hips 500 MG tablet Take 500 mg by mouth daily.        Allergies:  Allergies  Allergen Reactions   Brimonidine Other (See Comments)    Severe dry mouth   Timolol Other (See Comments)    Lightheaded   Misc. Sulfonamide Containing Compounds    Amoxicillin-Pot Clavulanate Diarrhea    GI pain     Sulfa Antibiotics Nausea Only and Rash    Hands go numb    Family History: No family history on file.  Social History:  reports that she has  never smoked. She has never used smokeless tobacco. She reports that she does not drink alcohol and does not use drugs.  ROS: All other review of systems were reviewed and are negative except what is noted above in HPI  Physical Exam: BP (!) 192/67   Pulse (!) 57   Constitutional:  Alert and oriented, No acute distress. HEENT:  AT, moist mucus membranes.  Trachea midline, no masses. Cardiovascular: No clubbing, cyanosis, or edema. Respiratory: Normal respiratory effort, no increased work of breathing. GI: Abdomen is soft, nontender, nondistended, no abdominal masses GU: No CVA tenderness.  Lymph: No cervical or inguinal lymphadenopathy. Skin: No rashes, bruises or suspicious lesions. Neurologic: Grossly intact, no focal deficits, moving all 4 extremities. Psychiatric: Normal mood and affect.  Laboratory Data: Lab Results  Component Value Date   WBC 13.3 (H) 03/25/2021   HGB 10.9 (L) 03/25/2021   HCT 32.2 (L) 03/25/2021   MCV 100.3 (H) 03/25/2021   PLT 266 03/25/2021    Lab Results  Component Value Date   CREATININE 1.47 (H) 03/25/2021    No results found for: PSA  No results found for: TESTOSTERONE  No results found for: HGBA1C  Urinalysis    Component Value Date/Time   COLORURINE AMBER (A) 03/25/2021 2139   APPEARANCEUR CLEAR 03/25/2021 2139   APPEARANCEUR Clear 02/06/2021 1613   LABSPEC 1.009 03/25/2021 2139   PHURINE 5.0 03/25/2021 2139   GLUCOSEU NEGATIVE 03/25/2021 2139   HGBUR SMALL (A) 03/25/2021 2139   BILIRUBINUR NEGATIVE 03/25/2021 2139   BILIRUBINUR Negative 02/06/2021 [place] 03/25/2021 2139   PROTEINUR NEGATIVE 03/25/2021 2139   UROBILINOGEN negative (A) 09/01/2019 1100   NITRITE POSITIVE (A) 03/25/2021 2139   LEUKOCYTESUR NEGATIVE 03/25/2021 2139    Lab Results  Component Value Date   LABMICR See below: 02/06/2021   WBCUA 0-5 02/06/2021   LABEPIT None seen 02/06/2021   MUCUS Present 08/23/2020   BACTERIA RARE (A)  03/25/2021    Pertinent Imaging:  No results found for this or any previous visit.  No results found for this or any previous visit.  No results found for this or any previous visit.  No results found for this or any previous visit.  No results found for this or any previous visit.  No results found for this or any previous visit.  No results found for this or any previous visit.  No results found for this or any previous visit.   Assessment & Plan:    1. Chronic cystitis with hematuria -Continue macrobid 50mg  qhs  2. Nephrolithiasis -RTc March with KUB  No follow-ups on file.  Nicolette Bang, MD  [facility] [place]

## 2021-06-27 NOTE — Progress Notes (Signed)
Urological Symptom Review  Patient is experiencing the following symptoms: Frequent urination Get up at night to urinate Leakage of urine Urinary tract infection   Review of Systems  Gastrointestinal (upper)  : Negative for upper GI symptoms  Gastrointestinal (lower) : Negative for lower GI symptoms  Constitutional : Negative for symptoms  Skin: Negative for skin symptoms  Eyes: Negative for eye symptoms  Ear/Nose/Throat : Negative for Ear/Nose/Throat symptoms  Hematologic/Lymphatic: Negative for Hematologic/Lymphatic symptoms  Cardiovascular : Leg swelling  Respiratory : Cough  Endocrine: Negative for endocrine symptoms  Musculoskeletal: Back pain Joint pain  Neurological: Negative for neurological symptoms  Psychologic: Negative for psychiatric symptoms

## 2021-06-27 NOTE — Patient Instructions (Signed)

## 2021-06-27 NOTE — Addendum Note (Signed)
Addended by: Tyrone Apple on: 06/27/2021 03:13 PM   Modules accepted: Orders

## 2021-07-10 DIAGNOSIS — H401313 Pigmentary glaucoma, right eye, severe stage: Secondary | ICD-10-CM | POA: Diagnosis not present

## 2021-07-10 DIAGNOSIS — H31001 Unspecified chorioretinal scars, right eye: Secondary | ICD-10-CM | POA: Diagnosis not present

## 2021-07-10 DIAGNOSIS — H401322 Pigmentary glaucoma, left eye, moderate stage: Secondary | ICD-10-CM | POA: Diagnosis not present

## 2021-07-10 DIAGNOSIS — G43109 Migraine with aura, not intractable, without status migrainosus: Secondary | ICD-10-CM | POA: Diagnosis not present

## 2021-08-21 DIAGNOSIS — R0781 Pleurodynia: Secondary | ICD-10-CM | POA: Diagnosis not present

## 2021-08-21 DIAGNOSIS — M4120 Other idiopathic scoliosis, site unspecified: Secondary | ICD-10-CM | POA: Diagnosis not present

## 2021-08-21 DIAGNOSIS — M545 Low back pain, unspecified: Secondary | ICD-10-CM | POA: Diagnosis not present

## 2021-08-21 DIAGNOSIS — M25551 Pain in right hip: Secondary | ICD-10-CM | POA: Diagnosis not present

## 2021-09-03 ENCOUNTER — Observation Stay (HOSPITAL_COMMUNITY)
Admission: EM | Admit: 2021-09-03 | Discharge: 2021-09-04 | Disposition: A | Discharge status: Home / Self Care | Payer: Medicare Other | Attending: Family Medicine | Admitting: Family Medicine

## 2021-09-03 ENCOUNTER — Encounter (HOSPITAL_COMMUNITY): Payer: Self-pay | Admitting: *Deleted

## 2021-09-03 ENCOUNTER — Emergency Department (HOSPITAL_COMMUNITY): Payer: Medicare Other

## 2021-09-03 ENCOUNTER — Other Ambulatory Visit: Payer: Self-pay

## 2021-09-03 ENCOUNTER — Observation Stay (HOSPITAL_COMMUNITY): Payer: Medicare Other

## 2021-09-03 DIAGNOSIS — R531 Weakness: Secondary | ICD-10-CM | POA: Diagnosis not present

## 2021-09-03 DIAGNOSIS — E039 Hypothyroidism, unspecified: Secondary | ICD-10-CM | POA: Insufficient documentation

## 2021-09-03 DIAGNOSIS — R4701 Aphasia: Secondary | ICD-10-CM | POA: Diagnosis present

## 2021-09-03 DIAGNOSIS — Z96611 Presence of right artificial shoulder joint: Secondary | ICD-10-CM | POA: Diagnosis not present

## 2021-09-03 DIAGNOSIS — Z96651 Presence of right artificial knee joint: Secondary | ICD-10-CM | POA: Diagnosis not present

## 2021-09-03 DIAGNOSIS — G459 Transient cerebral ischemic attack, unspecified: Principal | ICD-10-CM | POA: Insufficient documentation

## 2021-09-03 DIAGNOSIS — I63233 Cerebral infarction due to unspecified occlusion or stenosis of bilateral carotid arteries: Secondary | ICD-10-CM | POA: Diagnosis not present

## 2021-09-03 DIAGNOSIS — R479 Unspecified speech disturbances: Secondary | ICD-10-CM | POA: Diagnosis present

## 2021-09-03 DIAGNOSIS — I10 Essential (primary) hypertension: Secondary | ICD-10-CM

## 2021-09-03 DIAGNOSIS — E7800 Pure hypercholesterolemia, unspecified: Secondary | ICD-10-CM | POA: Insufficient documentation

## 2021-09-03 DIAGNOSIS — N390 Urinary tract infection, site not specified: Secondary | ICD-10-CM | POA: Diagnosis not present

## 2021-09-03 DIAGNOSIS — R471 Dysarthria and anarthria: Secondary | ICD-10-CM

## 2021-09-03 DIAGNOSIS — K219 Gastro-esophageal reflux disease without esophagitis: Secondary | ICD-10-CM | POA: Insufficient documentation

## 2021-09-03 DIAGNOSIS — Z79899 Other long term (current) drug therapy: Secondary | ICD-10-CM | POA: Insufficient documentation

## 2021-09-03 DIAGNOSIS — M2669 Other specified disorders of temporomandibular joint: Secondary | ICD-10-CM | POA: Diagnosis not present

## 2021-09-03 DIAGNOSIS — R200 Anesthesia of skin: Secondary | ICD-10-CM | POA: Diagnosis not present

## 2021-09-03 DIAGNOSIS — M47812 Spondylosis without myelopathy or radiculopathy, cervical region: Secondary | ICD-10-CM | POA: Diagnosis not present

## 2021-09-03 LAB — COMPREHENSIVE METABOLIC PANEL
ALT: 25 U/L (ref 0–44)
AST: 28 U/L (ref 15–41)
Albumin: 4.5 g/dL (ref 3.5–5.0)
Alkaline Phosphatase: 63 U/L (ref 38–126)
Anion gap: 8 (ref 5–15)
BUN: 16 mg/dL (ref 8–23)
CO2: 22 mmol/L (ref 22–32)
Calcium: 9.3 mg/dL (ref 8.9–10.3)
Chloride: 103 mmol/L (ref 98–111)
Creatinine, Ser: 0.75 mg/dL (ref 0.44–1.00)
GFR, Estimated: >60 mL/min (ref >60)
Glucose, Bld: 99 mg/dL (ref 70–99)
Potassium: 4.3 mmol/L (ref 3.5–5.1)
Sodium: 133 mmol/L — ABNORMAL LOW (ref 135–145)
Total Bilirubin: 0.6 mg/dL (ref 0.3–1.2)
Total Protein: 6.8 g/dL (ref 6.5–8.1)

## 2021-09-03 LAB — CBC WITH DIFFERENTIAL/PLATELET
Abs Immature Granulocytes: 0.01 10*3/uL (ref 0.00–0.07)
Basophils Absolute: 0 10*3/uL (ref 0.0–0.1)
Basophils Relative: 0 %
Eosinophils Absolute: 0 10*3/uL (ref 0.0–0.5)
Eosinophils Relative: 0 %
HCT: 34.7 % — ABNORMAL LOW (ref 36.0–46.0)
Hemoglobin: 11.8 g/dL — ABNORMAL LOW (ref 12.0–15.0)
Immature Granulocytes: 0 %
Lymphocytes Relative: 20 %
Lymphs Abs: 1 10*3/uL (ref 0.7–4.0)
MCH: 33.7 pg (ref 26.0–34.0)
MCHC: 34 g/dL (ref 30.0–36.0)
MCV: 99.1 fL (ref 80.0–100.0)
Monocytes Absolute: 0.4 10*3/uL (ref 0.1–1.0)
Monocytes Relative: 7 %
Neutro Abs: 3.6 10*3/uL (ref 1.7–7.7)
Neutrophils Relative %: 73 %
Platelets: 197 10*3/uL (ref 150–400)
RBC: 3.5 MIL/uL — ABNORMAL LOW (ref 3.87–5.11)
RDW: 12.1 % (ref 11.5–15.5)
WBC: 5 10*3/uL (ref 4.0–10.5)
nRBC: 0 % (ref 0.0–0.2)

## 2021-09-03 LAB — PROTIME-INR
INR: 1 (ref 0.8–1.2)
Prothrombin Time: 12.9 seconds (ref 11.4–15.2)

## 2021-09-03 MED ORDER — DICYCLOMINE HCL 10 MG PO CAPS
10.0000 mg | ORAL_CAPSULE | Freq: Four times a day (QID) | ORAL | Status: DC
  Administered 2021-09-03 – 2021-09-04 (×3): 10 mg via ORAL
  Filled 2021-09-03 (×3): qty 1

## 2021-09-03 MED ORDER — AMLODIPINE BESYLATE 5 MG PO TABS
5.0000 mg | ORAL_TABLET | Freq: Every day | ORAL | Status: DC
  Administered 2021-09-03 – 2021-09-04 (×2): 5 mg via ORAL
  Filled 2021-09-03 (×2): qty 1

## 2021-09-03 MED ORDER — ASPIRIN EC 81 MG PO TBEC
81.0000 mg | DELAYED_RELEASE_TABLET | Freq: Every day | ORAL | Status: DC
  Administered 2021-09-04: 81 mg via ORAL
  Filled 2021-09-03: qty 1

## 2021-09-03 MED ORDER — IOHEXOL 350 MG/ML SOLN
80.0000 mL | Freq: Once | INTRAVENOUS | Status: AC | PRN
  Administered 2021-09-03: 80 mL via INTRAVENOUS

## 2021-09-03 MED ORDER — DORZOLAMIDE HCL 2 % OP SOLN
1.0000 [drp] | Freq: Three times a day (TID) | OPHTHALMIC | Status: DC
  Administered 2021-09-03: 1 [drp] via OPHTHALMIC
  Filled 2021-09-03: qty 10

## 2021-09-03 MED ORDER — LATANOPROST 0.005 % OP SOLN: 1.0000 [drp] | Freq: Every day | OPHTHALMIC | Status: DC

## 2021-09-03 MED ORDER — SODIUM CHLORIDE 0.9 % IV SOLN: INTRAVENOUS | Status: DC

## 2021-09-03 MED ORDER — SENNOSIDES-DOCUSATE SODIUM 8.6-50 MG PO TABS: 1.0000 | ORAL_TABLET | Freq: Every evening | ORAL | Status: DC | PRN

## 2021-09-03 MED ORDER — LOSARTAN POTASSIUM 50 MG PO TABS: 75.0000 mg | ORAL_TABLET | Freq: Every day | ORAL | Status: DC

## 2021-09-03 MED ORDER — FAMOTIDINE 20 MG PO TABS
20.0000 mg | ORAL_TABLET | Freq: Two times a day (BID) | ORAL | Status: DC
  Administered 2021-09-03: 20 mg via ORAL
  Filled 2021-09-03 (×2): qty 1

## 2021-09-03 MED ORDER — PANTOPRAZOLE SODIUM 40 MG PO TBEC
40.0000 mg | DELAYED_RELEASE_TABLET | Freq: Every day | ORAL | Status: DC
Start: 2021-09-04 — End: 2021-09-04
  Administered 2021-09-04: 40 mg via ORAL
  Filled 2021-09-03: qty 1

## 2021-09-03 MED ORDER — ACETAMINOPHEN 650 MG RE SUPP: 650.0000 mg | RECTAL | Status: DC | PRN

## 2021-09-03 MED ORDER — STROKE: EARLY STAGES OF RECOVERY BOOK
Freq: Once | Status: AC
  Filled 2021-09-03: qty 1

## 2021-09-03 MED ORDER — HYDRALAZINE HCL 20 MG/ML IJ SOLN: 10.0000 mg | INTRAMUSCULAR | Status: DC | PRN

## 2021-09-03 MED ORDER — ASCORBIC ACID 500 MG PO TABS
500.0000 mg | ORAL_TABLET | Freq: Every day | ORAL | Status: DC
  Administered 2021-09-04: 500 mg via ORAL
  Filled 2021-09-03: qty 1

## 2021-09-03 MED ORDER — SIMVASTATIN 20 MG PO TABS
20.0000 mg | ORAL_TABLET | Freq: Every evening | ORAL | Status: DC
  Administered 2021-09-03: 20 mg via ORAL
  Filled 2021-09-03: qty 1

## 2021-09-03 MED ORDER — HYDRALAZINE HCL 20 MG/ML IJ SOLN
10.0000 mg | INTRAMUSCULAR | Status: DC | PRN
  Administered 2021-09-03: 10 mg via INTRAVENOUS
  Filled 2021-09-03: qty 1

## 2021-09-03 MED ORDER — ADULT MULTIVITAMIN W/MINERALS CH
1.0000 | ORAL_TABLET | Freq: Every day | ORAL | Status: DC
  Administered 2021-09-03: 1 via ORAL
  Filled 2021-09-03 (×2): qty 1

## 2021-09-03 MED ORDER — ACETAMINOPHEN 160 MG/5ML PO SOLN: 650.0000 mg | ORAL | Status: DC | PRN

## 2021-09-03 MED ORDER — ASPIRIN 325 MG PO TABS
325.0000 mg | ORAL_TABLET | Freq: Once | ORAL | Status: AC
  Administered 2021-09-03: 325 mg via ORAL
  Filled 2021-09-03: qty 1

## 2021-09-03 MED ORDER — LOSARTAN POTASSIUM 50 MG PO TABS
100.0000 mg | ORAL_TABLET | Freq: Every day | ORAL | Status: DC
  Administered 2021-09-04: 100 mg via ORAL
  Filled 2021-09-03: qty 2

## 2021-09-03 MED ORDER — ENOXAPARIN SODIUM 40 MG/0.4ML IJ SOSY
40.0000 mg | PREFILLED_SYRINGE | INTRAMUSCULAR | Status: DC
  Administered 2021-09-03: 40 mg via SUBCUTANEOUS
  Filled 2021-09-03: qty 0.4

## 2021-09-03 MED ORDER — LEVOTHYROXINE SODIUM 50 MCG PO TABS
50.0000 ug | ORAL_TABLET | Freq: Every day | ORAL | Status: DC
  Administered 2021-09-04: 50 ug via ORAL
  Filled 2021-09-03: qty 1

## 2021-09-03 MED ORDER — CLOPIDOGREL BISULFATE 75 MG PO TABS
75.0000 mg | ORAL_TABLET | Freq: Every day | ORAL | Status: DC
  Administered 2021-09-03 – 2021-09-04 (×2): 75 mg via ORAL
  Filled 2021-09-03 (×2): qty 1

## 2021-09-03 MED ORDER — ACETAMINOPHEN 325 MG PO TABS
650.0000 mg | ORAL_TABLET | ORAL | Status: DC | PRN
  Administered 2021-09-03: 650 mg via ORAL
  Filled 2021-09-03: qty 2

## 2021-09-03 MED ORDER — NITROFURANTOIN MACROCRYSTAL 50 MG PO CAPS
50.0000 mg | ORAL_CAPSULE | Freq: Every day | ORAL | Status: DC
  Administered 2021-09-03: 50 mg via ORAL
  Filled 2021-09-03 (×2): qty 1

## 2021-09-03 NOTE — ED Provider Notes (Signed)
[facility] EMERGENCY DEPARTMENT Provider Note   CSN: [id number] Arrival date & time: 09/03/21  1059     History  Chief Complaint  Patient presents with   Aphasia    Rebekah King is a 81 y.o. female.  Patient states that she has a history of arthritis and GERD.  She stated to 3 days ago she had weakness and numbness in her right hand before she went to bed.  She went to sleep and when she woke up her hand was normal.  She also states today for about 45 minutes she had difficulty getting her words out.  She is back to normal now  The history is provided by the patient and medical records. No language interpreter was used.  Weakness Severity:  Moderate Onset quality:  Sudden Timing:  Sporadic Progression:  Resolved Chronicity:  New Context: not alcohol use   Relieved by:  Nothing Worsened by:  Nothing Ineffective treatments:  None tried Associated symptoms: no abdominal pain, no chest pain, no cough, no diarrhea, no frequency, no headaches and no seizures       Home Medications Prior to Admission medications   Medication Sig Start Date End Date Taking? Authorizing Provider  Ascorbic Acid (VITAMIN C WITH ROSE HIPS) 500 MG tablet Take 500 mg by mouth daily.   Yes [provider]  Calcium Carb-Cholecalciferol (CALCIUM 600+D3 PO) Take 1 tablet by mouth in the morning and at bedtime.   Yes [provider]  dicyclomine (BENTYL) 10 MG capsule Take 10 mg by mouth 4 (four) times daily. 02/28/21  Yes [provider]  dorzolamide (TRUSOPT) 2 % ophthalmic solution Place 1 drop into both eyes 3 (three) times daily.  08/27/19  Yes [provider]  famotidine (PEPCID) 20 MG tablet Take 20 mg by mouth 2 (two) times daily. 08/27/19  Yes [provider]  latanoprost (XALATAN) 0.005 % ophthalmic solution Place 1 drop into both eyes at bedtime.   Yes [provider]  levothyroxine (SYNTHROID, LEVOTHROID) 50 MCG tablet Take 50 mcg by mouth daily  before breakfast.   Yes [provider]  losartan (COZAAR) 100 MG tablet Take 100 mg by mouth daily. 06/08/21  Yes [provider]  meloxicam (MOBIC) 15 MG tablet Take 15 mg by mouth daily. 03/02/21  Yes [provider]  Multiple Vitamin (MULTIVITAMIN WITH MINERALS) TABS tablet Take 1 tablet by mouth daily.   Yes [provider]  nitrofurantoin (MACRODANTIN) 50 MG capsule Take 1 capsule (50 mg total) by mouth at bedtime. Take 1 capsule (50 mg total) by mouth at bedtime. 06/27/21  Yes McKenzie, Candee Furbish, MD  pantoprazole (PROTONIX) 40 MG tablet Take 40 mg by mouth daily. 03/02/21  Yes [provider]  simvastatin (ZOCOR) 20 MG tablet Take 20 mg by mouth every evening.   Yes [provider]  cefUROXime (CEFTIN) 250 MG tablet Take 1 tablet (250 mg total) by mouth 2 (two) times daily with a meal. Patient not taking: Reported on 09/03/2021 06/27/21   Cleon Gustin, MD      Allergies    Brimonidine, Timolol, Misc. sulfonamide containing compounds, Amoxicillin-pot clavulanate, and Sulfa antibiotics    Review of Systems   Review of Systems  Constitutional:  Negative for appetite change and fatigue.  HENT:  Negative for congestion, ear discharge and sinus pressure.   Eyes:  Negative for discharge.  Respiratory:  Negative for cough.   Cardiovascular:  Negative for chest pain.  Gastrointestinal:  Negative for  abdominal pain and diarrhea.  Genitourinary:  Negative for frequency and hematuria.  Musculoskeletal:  Positive for extremity weakness. Negative for back pain.  Skin:  Negative for rash.  Neurological:  Positive for weakness. Negative for seizures and headaches.       Aphasia  Psychiatric/Behavioral:  Negative for hallucinations.    Physical Exam Updated Vital Signs BP (!) 188/79    Pulse 61    Temp 98 F (36.7 C) (Oral)    Resp 18    Ht 5\' 2"  (1.575 m)    Wt 55.3 kg    SpO2 99%    BMI 22.31 kg/m  Physical Exam Vitals and nursing  note reviewed.  Constitutional:      Appearance: She is well-developed.  HENT:     Head: Normocephalic.     Nose: Nose normal.  Eyes:     General: No scleral icterus.    Conjunctiva/sclera: Conjunctivae normal.  Neck:     Thyroid: No thyromegaly.  Cardiovascular:     Rate and Rhythm: Normal rate and regular rhythm.     Heart sounds: No murmur heard.   No friction rub. No gallop.  Pulmonary:     Breath sounds: No stridor. No wheezing or rales.  Chest:     Chest wall: No tenderness.  Abdominal:     General: There is no distension.     Tenderness: There is no abdominal tenderness. There is no rebound.  Musculoskeletal:        General: Normal range of motion.     Cervical back: Neck supple.  Lymphadenopathy:     Cervical: No cervical adenopathy.  Skin:    Findings: No erythema or rash.  Neurological:     Mental Status: She is alert and oriented to person, place, and time.     Motor: No abnormal muscle tone.     Coordination: Coordination normal.  Psychiatric:        Behavior: Behavior normal.    ED Results / Procedures / Treatments   Labs (all labs ordered are listed, but only abnormal results are displayed) Labs Reviewed  CBC WITH DIFFERENTIAL/PLATELET - Abnormal; Notable for the following components:      Result Value   RBC 3.50 (*)    Hemoglobin 11.8 (*)    HCT 34.7 (*)    All other components within normal limits  COMPREHENSIVE METABOLIC PANEL - Abnormal; Notable for the following components:   Sodium 133 (*)    All other components within normal limits  PROTIME-INR    EKG None  Radiology CT Head Wo Contrast  Result Date: 09/03/2021 CLINICAL DATA:  Transient ischemic attack (TIA) EXAM: CT HEAD WITHOUT CONTRAST TECHNIQUE: Contiguous axial images were obtained from the base of the skull through the vertex without intravenous contrast. RADIATION DOSE REDUCTION: This exam was performed according to the departmental dose-optimization program which includes  automated exposure control, adjustment of the mA and/or kV according to patient size and/or use of iterative reconstruction technique. COMPARISON:  No images retrievable at the time of this dictation. FINDINGS: Brain: No evidence of acute intracranial hemorrhage or extra-axial collection.No evidence of mass lesion/concern mass effect.The ventricles are normal in size.Confluent periventricular and subcortical white matter hypoattenuation, which is nonspecific but likely sequela of chronic small vessel ischemic disease.Mild cerebral atrophy Vascular: No hyperdense vessel or unexpected calcification. 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 No acute fracture. Chronic nasal bone deformities. Sinuses/Orbits: Right posterior ethmoid air cell opacification. The orbits are unremarkable. Other: None. IMPRESSION: No acute intracranial abnormality. Moderate sequela of chronic small vessel ischemic disease. 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 Opacified right posterior ethmoid air cells. Electronically Signed   By: Maurine Simmering M.D.   On: 09/03/2021 14:23   DG Chest Port 1 View  Result Date: 09/03/2021 CLINICAL DATA:  Weakness, episode of RIGHT hand numbness and tingling, could not speak for 45 minutes this morning EXAM: PORTABLE CHEST 1 VIEW COMPARISON:  Portable exam 1318 hours compared to 03/25/2021 FINDINGS: Upper normal heart size. Mediastinal contours and pulmonary vascularity normal. Atherosclerotic calcification aorta. Bronchitic changes without infiltrate, pleural effusion, or pneumothorax. Bones demineralized levoconvex thoracolumbar scoliosis and RIGHT shoulder prosthesis noted. IMPRESSION: No acute abnormalities. Aortic Atherosclerosis (ICD10-I70.0). Electronically Signed   By: Lavonia Dana M.D.   On:  09/03/2021 13:30    Procedures Procedures   CRITICAL CARE Performed by: Milton Ferguson Total critical care time: 40 minutes Critical care time was exclusive of separately billable procedures and treating other patients. Critical care was necessary to treat or prevent imminent or life-threatening deterioration. Critical care was time spent personally by me on the following activities: development of treatment plan with patient and/or surrogate as well as nursing, discussions with consultants, evaluation of patient's response to treatment, examination of patient, obtaining history from patient or surrogate, ordering and performing treatments and interventions, ordering and review of laboratory studies, ordering and review of radiographic studies, pulse oximetry and re-evaluation of patient's condition.  Medications Ordered in ED Medications  aspirin tablet 325 mg (325 mg Oral Given 09/03/21 1316)    ED Course/ Medical Decision Making/ A&P                           Medical Decision Making Amount and/or Complexity of Data Reviewed Labs: ordered. Radiology: ordered. ECG/medicine tests: ordered.  Risk OTC drugs. Prescription drug management. Decision regarding hospitalization.   Patient with a TIA.  She will be admitted to medicine.  Nephrology recommended MRI of the brain with CT of the head neck and will place additional recommendations in their note.    This patient presents to the ED for concern of aphasia, this involves an extensive number of treatment options, and is a complaint that carries with it a high risk of complications and morbidity.  The differential diagnosis includes TIA, seizure, mass lesion and brain   Co morbidities that complicate the patient evaluation  Significant arthritis   Additional history obtained:  Additional history obtained from spouse External records from outside source obtained and reviewed including hospital record   Lab Tests:  I  Ordered, and personally interpreted labs.  The pertinent results include: Hemoglobin shows mild anemia   Imaging Studies ordered:  I ordered imaging studies including CT head and MRI unremarkable I independently visualized and interpreted imaging which showed no acute stroke I agree with the radiologist interpretation   Cardiac Monitoring:  The patient was maintained on a cardiac monitor.  I personally viewed and interpreted the cardiac monitored which showed an underlying rhythm of: Sinus tach   Medicines ordered and prescription drug management:  I ordered medication including aspirin for TIA Reevaluation of the patient after these medicines showed that the patient improved I have reviewed the patients home medicines and have made adjustments as needed  Test Considered:  Echo   Critical Interventions:  Consulting neurology   Consultations Obtained:  I requested consultation with the neurology,  and discussed lab and imaging findings as well as pertinent plan - they recommend: MRI, CTA of the head and neck and admission   Problem List / ED Course:  TIA   Reevaluation:  After the interventions noted above, I reevaluated the patient and found that they have :improved   Social Determinants of Health:  None   Dispostion:  After consideration of the diagnostic results and the patients response to treatment, I feel that the patent would benefit from admission for further TIA work-up.         Final Clinical Impression(s) / ED Diagnoses Final diagnoses:  TIA (transient ischemic attack)    Rx / DC Orders ED Discharge Orders     None         Milton Ferguson, MD 09/04/21 [phone]

## 2021-09-03 NOTE — Plan of Care (Addendum)
ON CALL PHONE CONSULT  Call from [email or web address] at 2:40 PM   Discussion: 81 year old who had a transient episode of right hand numbness a few days ago, today had a 45-minute episode of word finding difficulty, now back to baseline.  ABCD2 score is a 4.  Also extremely hypertensive on arrival.  Given the right-sided sensory symptoms and word finding symptoms both of which localized to the left hemisphere, she should receive work-up for left hemispheric TIA.  Symptoms could also be related to hypertensive urgency as her systolics were very high.     Recs: MRI brain without contrast CTA head and neck 2D echo A1c Lipid panel Frequent rechecks Telemetry Start aspirin 81+ Plavix 75 for 3 weeks followed by aspirin only PT OT speech therapy After the MRIs done-if it shows a stroke, I would continue to allow for permissive hypertension and treat only if systolics greater than 79 on a as needed basis for the next 48 to 72 hours.  If the MRI is negative for stroke, I would still allow for some amount of permissive hypertension-in that case systolics should be less than 180 at all times and goal blood pressure on discharge should be normotension.  Unfortunately there is no telemedicine coverage except for code strokes for [facility] this week.  I would be happy to review the chart once the work-up is done for further recommendations from afar-but if an inpatient consultation is desired, the patient will need transfer to [facility].  I have discussed my plan in detail with Dr. [facility].  Please do not hesitate to call me as needed.  -- Roderic Palau, MD Neurologist [facility] Pager: [phone]

## 2021-09-03 NOTE — Assessment & Plan Note (Signed)
-   Protonix ordered for GI protection 

## 2021-09-03 NOTE — H&P (Signed)
History and Physical    Patient: Rebekah King DOB: 03/06/1941 DOA: 09/03/2021 DOS: the patient was seen and examined on 09/03/2021 PCP: Manon Hilding, MD  Patient coming from: Home  Chief Complaint:  Chief Complaint  Patient presents with   Aphasia    HPI: Rebekah King is a 81 year old female with hypercholesterolemia, GERD, osteoporosis, arthritis, hypothyroidism, mild cognitive impairment, bilateral sensorineural hearing loss, chronic cystitis and pseudophakia of both eyes reported that she has been having transient right hand numbness for the past several days and today she had a prolonged period of difficulty finding words that lasted about 45 minutes.  Fortunately her symptoms have returned to baseline.  She presented to the emergency department very hypertensive.  She was seen by telemetry neurology who recommended TIA work-up.  Her symptoms could also have been related to a hypertensive urgency.  The recommendation was for her to have an observation admission and further recommendations if her MRI is positive for acute ischemic infarction.  Review of Systems: Review of Systems  Constitutional: Negative.   HENT: Negative.    Eyes:  Positive for blurred vision and double vision.  Respiratory: Negative.    Cardiovascular: Negative.   Gastrointestinal:  Positive for heartburn. Negative for abdominal pain, blood in stool, constipation, nausea and vomiting.  Genitourinary: Negative.   Musculoskeletal:  Positive for joint pain and myalgias.  Skin: Negative.   Neurological:  Positive for sensory change and speech change. Negative for dizziness, focal weakness, seizures and loss of consciousness.  Endo/Heme/Allergies: Negative.   Psychiatric/Behavioral: Negative.    All other systems reviewed and are negative.  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 She has never used smokeless tobacco. She reports that she does not drink alcohol and does not use drugs.  Allergies  Allergen Reactions   Brimonidine Other (See Comments)    Severe dry mouth   Timolol Other (See Comments)    Lightheaded   Misc. Sulfonamide Containing Compounds    Amoxicillin-Pot Clavulanate Diarrhea    GI pain     Sulfa Antibiotics Nausea Only and Rash    Hands go numb    History reviewed. No pertinent family history.  Prior to Admission medications   Medication Sig Start Date End Date Taking? Authorizing Provider  Ascorbic Acid (VITAMIN C WITH ROSE HIPS) 500 MG tablet Take 500 mg by mouth daily.   Yes [provider]  Calcium Carb-Cholecalciferol (CALCIUM 600+D3 PO) Take 1 tablet by  mouth in the morning and at bedtime.   Yes [provider]  dicyclomine (BENTYL) 10 MG capsule Take 10 mg by mouth 4 (four) times daily. 02/28/21  Yes [provider]  dorzolamide (TRUSOPT) 2 % ophthalmic solution Place 1 drop into both eyes 3 (three) times daily.  08/27/19  Yes [provider]  famotidine (PEPCID) 20 MG tablet Take 20 mg by mouth 2 (two)  times daily. 08/27/19  Yes [provider]  latanoprost (XALATAN) 0.005 % ophthalmic solution Place 1 drop into both eyes at bedtime.   Yes [provider]  levothyroxine (SYNTHROID, LEVOTHROID) 50 MCG tablet Take 50 mcg by mouth daily before breakfast.   Yes [provider]  losartan (COZAAR) 100 MG tablet Take 100 mg by mouth daily. 06/08/21  Yes [provider]  meloxicam (MOBIC) 15 MG tablet Take 15 mg by mouth daily. 03/02/21  Yes [provider]  Multiple Vitamin (MULTIVITAMIN WITH MINERALS) TABS tablet Take 1 tablet by mouth daily.   Yes [provider]  nitrofurantoin (MACRODANTIN) 50 MG capsule Take 1 capsule (50 mg total) by mouth at bedtime. Take 1 capsule (50 mg total) by mouth at bedtime. 06/27/21  Yes McKenzie, Candee Furbish, MD  pantoprazole (PROTONIX) 40 MG tablet Take 40 mg by mouth daily. 03/02/21  Yes [provider]  simvastatin (ZOCOR) 20 MG tablet Take 20 mg by mouth every evening.   Yes [provider]  cefUROXime (CEFTIN) 250 MG tablet Take 1 tablet (250 mg total) by mouth 2 (two) times daily with a meal. Patient not taking: Reported on 09/03/2021 06/27/21   Cleon Gustin, MD    Physical Exam: Vitals:   09/03/21 1300 09/03/21 1330 09/03/21 1400 09/03/21 1430  BP: (!) 206/81 (!) 196/76 (!) 186/75 (!) 188/79  Pulse: 71 64 62 61  Resp: 18 (!) 25 20 18   Temp:      TempSrc:      SpO2: 100% 100% 99% 99%  Weight:      Height:       Physical Exam Vitals and nursing note reviewed.  Constitutional:      Appearance: Normal appearance.  HENT:     Head: Normocephalic and atraumatic.     Nose: Nose normal.  Eyes:     Extraocular Movements: Extraocular movements intact.     Pupils: Pupils are equal, round, and reactive to light.  Cardiovascular:     Rate and Rhythm: Normal rate.     Pulses: Normal pulses.     Heart sounds: Normal heart sounds.  Pulmonary:     Effort: Pulmonary effort is normal.   Abdominal:     General: There is no distension.     Palpations: There is no mass.     Tenderness: There is no abdominal tenderness.  Musculoskeletal:     Cervical back: Normal range of motion and neck supple.     Comments: Diffuse osteoarthritic changes   Skin:    General: Skin is warm.  Neurological:     Mental Status: She is alert. Mental status is at baseline.     Cranial Nerves: No cranial nerve deficit.     Sensory: No sensory deficit.     Motor: No weakness.  Psychiatric:        Mood and Affect: Mood normal.     Data Reviewed:  All admission labs reviewed   Assessment and Plan: * TIA (transient ischemic attack)- (present on admission) Appreciate teleneurology consultation and recommendations.  MRI brain,  CTA head and neck 2D echocardiogram  A1c Lipid panel Frequent rechecks Telemetry Start aspirin 81+ Plavix 75 for 3 weeks followed by aspirin only PT OT speech therapy No neurology coverage inhouse this week so if MRI positive for stroke will do telephone consult with neurologist working remotely at OSH. Permissive hypertension    Speech abnormality- (present on admission) Suspect related to TIA Fortunately symptoms have resolved now.  Full TIA/Stroke work up [employer] for observation  Further recommendations to follow   Hypercholesterolemia- (present on admission) Check fasting lipids as part of stroke work up.   Gastroesophageal reflux disease- (present on admission) Protonix ordered for GI protection    Advance Care Planning:   Code Status: Prior Full   Consults: teleneurology   Family Communication: bedside update   Severity of Illness: The appropriate patient status for this patient is OBSERVATION. Observation status is judged to be reasonable and necessary in order to provide the required intensity of service to ensure the patient's safety. The patient's presenting symptoms, physical exam findings, and initial radiographic and laboratory data in the  context of their medical condition is felt to place them at decreased risk for further clinical deterioration. Furthermore, it is anticipated that the patient will be medically stable for discharge from the hospital within 2 midnights of admission.   Author: Irwin Brakeman, MD 09/03/2021 3:14 PM  For on call review [email or web address].

## 2021-09-03 NOTE — ED Notes (Signed)
Pt denies cp sob or ha, reports deficits from earlier have resolved. Pt had similar episode 3 days ago accompanied by right hand numbness and tingling.

## 2021-09-03 NOTE — Assessment & Plan Note (Addendum)
Appreciate teleneurology consultation and recommendations.  MRI brain, CTA head and neck 2D echocardiogram  A1c Lipid panel Frequent rechecks Telemetry Start aspirin 81+ Plavix 75 for 3 weeks followed by aspirin only PT OT speech therapy No neurology coverage inhouse this week so if MRI positive for stroke will do telephone consult with neurologist working remotely at OSH. Permissive hypertension

## 2021-09-03 NOTE — Assessment & Plan Note (Signed)
Suspect related to TIA Fortunately symptoms have resolved now.  Full TIA/Stroke work up [employer] for observation  Further recommendations to follow

## 2021-09-03 NOTE — Assessment & Plan Note (Signed)
Check fasting lipids as part of stroke work up.

## 2021-09-03 NOTE — ED Triage Notes (Signed)
States she had a episode this am and could not speak for 45 minutes, states he BP was elevated then. Able to speak clearly on arrival

## 2021-09-03 NOTE — Hospital Course (Signed)
81 year old female with hypercholesterolemia, GERD, osteoporosis, arthritis, hypothyroidism, mild cognitive impairment, bilateral sensorineural hearing loss, chronic cystitis and pseudophakia of both eyes reported that she has been having transient right hand numbness for the past several days and today she had a prolonged period of difficulty finding words that lasted about 45 minutes.  Fortunately her symptoms have returned to baseline.  She presented to the emergency department very hypertensive.  She was seen by telemetry neurology who recommended TIA work-up.  Her symptoms could also have been related to a hypertensive urgency.  The recommendation was for her to have an observation admission and further recommendations if her MRI is positive for acute ischemic infarction.

## 2021-09-04 ENCOUNTER — Other Ambulatory Visit (HOSPITAL_COMMUNITY): Payer: Self-pay | Admitting: *Deleted

## 2021-09-04 ENCOUNTER — Observation Stay (HOSPITAL_BASED_OUTPATIENT_CLINIC_OR_DEPARTMENT_OTHER): Payer: Medicare Other

## 2021-09-04 DIAGNOSIS — E7800 Pure hypercholesterolemia, unspecified: Secondary | ICD-10-CM | POA: Diagnosis not present

## 2021-09-04 DIAGNOSIS — G459 Transient cerebral ischemic attack, unspecified: Secondary | ICD-10-CM | POA: Diagnosis not present

## 2021-09-04 DIAGNOSIS — R471 Dysarthria and anarthria: Secondary | ICD-10-CM | POA: Diagnosis not present

## 2021-09-04 DIAGNOSIS — K219 Gastro-esophageal reflux disease without esophagitis: Secondary | ICD-10-CM | POA: Diagnosis not present

## 2021-09-04 LAB — COMPREHENSIVE METABOLIC PANEL
ALT: 23 U/L (ref 0–44)
AST: 25 U/L (ref 15–41)
Albumin: 4.3 g/dL (ref 3.5–5.0)
Alkaline Phosphatase: 60 U/L (ref 38–126)
Anion gap: 7 (ref 5–15)
BUN: 15 mg/dL (ref 8–23)
CO2: 21 mmol/L — ABNORMAL LOW (ref 22–32)
Calcium: 8.9 mg/dL (ref 8.9–10.3)
Chloride: 105 mmol/L (ref 98–111)
Creatinine, Ser: 0.75 mg/dL (ref 0.44–1.00)
GFR, Estimated: >60 mL/min (ref >60)
Glucose, Bld: 91 mg/dL (ref 70–99)
Potassium: 4 mmol/L (ref 3.5–5.1)
Sodium: 133 mmol/L — ABNORMAL LOW (ref 135–145)
Total Bilirubin: 0.5 mg/dL (ref 0.3–1.2)
Total Protein: 6.5 g/dL (ref 6.5–8.1)

## 2021-09-04 LAB — CBC
HCT: 34 % — ABNORMAL LOW (ref 36.0–46.0)
Hemoglobin: 11.6 g/dL — ABNORMAL LOW (ref 12.0–15.0)
MCH: 33.6 pg (ref 26.0–34.0)
MCHC: 34.1 g/dL (ref 30.0–36.0)
MCV: 98.6 fL (ref 80.0–100.0)
Platelets: 202 10*3/uL (ref 150–400)
RBC: 3.45 MIL/uL — ABNORMAL LOW (ref 3.87–5.11)
RDW: 12.1 % (ref 11.5–15.5)
WBC: 5.4 10*3/uL (ref 4.0–10.5)
nRBC: 0 % (ref 0.0–0.2)

## 2021-09-04 LAB — LIPID PANEL
Cholesterol: 145 mg/dL (ref 0–200)
HDL: 53 mg/dL (ref >40)
LDL Cholesterol: 70 mg/dL (ref 0–99)
Total CHOL/HDL Ratio: 2.7 RATIO
Triglycerides: 110 mg/dL (ref <150)
VLDL: 22 mg/dL (ref 0–40)

## 2021-09-04 LAB — HEMOGLOBIN A1C
Hgb A1c MFr Bld: 5.4 % (ref 4.8–5.6)
Mean Plasma Glucose: 108 mg/dL

## 2021-09-04 LAB — ECHOCARDIOGRAM COMPLETE
Area-P 1/2: 2.95 cm2
Height: 62 in
P 1/2 time: 436 msec
S' Lateral: 3.2 cm
Weight: 1936 oz

## 2021-09-04 MED ORDER — AMLODIPINE BESYLATE 5 MG PO TABS: 5.0000 mg | ORAL_TABLET | Freq: Every day | ORAL | 1 refills | Status: DC

## 2021-09-04 MED ORDER — ASPIRIN 81 MG PO TBEC: 81.0000 mg | DELAYED_RELEASE_TABLET | Freq: Every day | ORAL | 0 refills | Status: AC

## 2021-09-04 MED ORDER — CLOPIDOGREL BISULFATE 75 MG PO TABS: 75.0000 mg | ORAL_TABLET | Freq: Every day | ORAL | 2 refills | Status: DC

## 2021-09-04 MED ORDER — MELOXICAM 15 MG PO TABS: 15.0000 mg | ORAL_TABLET | Freq: Every day | ORAL | Status: DC | PRN

## 2021-09-04 MED ORDER — HYDRALAZINE HCL 10 MG PO TABS: 10.0000 mg | ORAL_TABLET | Freq: Three times a day (TID) | ORAL | 1 refills | Status: DC

## 2021-09-04 NOTE — Evaluation (Signed)
Physical Therapy Evaluation Patient Details Name: Rebekah King MRN: [id number] DOB: Jan 29, 1941 Today's Date: 09/04/2021  History of Present Illness  Rebekah King is a 81 year old female with hypercholesterolemia, GERD, osteoporosis, arthritis, hypothyroidism, mild cognitive impairment, bilateral sensorineural hearing loss, chronic cystitis and pseudophakia of both eyes reported that she has been having transient right hand numbness for the past several days and today she had a prolonged period of difficulty finding words that lasted about 45 minutes.  Fortunately her symptoms have returned to baseline.  She presented to the emergency department very hypertensive.  She was seen by telemetry neurology who recommended TIA work-up.  Her symptoms could also have been related to a hypertensive urgency.  The recommendation was for her to have an observation admission and further recommendations if her MRI is positive for acute ischemic infarction.   Clinical Impression  Patient functioning at baseline for functional mobility and gait demonstrating good return for ambulation on level, inclined, declined surfaces and stairs without loss of balance.  Plan:  Patient discharged from physical therapy to care of nursing for ambulation daily as tolerated for length of stay.         Recommendations for follow up therapy are one component of a multi-disciplinary discharge planning process, led by the attending physician.  Recommendations may be updated based on patient status, additional functional criteria and insurance authorization.  Follow Up Recommendations No PT follow up    Assistance Recommended at Discharge PRN  Patient can return home with the following  Help with stairs or ramp for entrance;A little help with walking and/or transfers;A little help with bathing/dressing/bathroom;Assistance with cooking/housework    Equipment Recommendations None recommended by PT  Recommendations for Other  Services       Functional Status Assessment Patient has not had a recent decline in their functional status     Precautions / Restrictions Precautions Precautions: None Restrictions Weight Bearing Restrictions: No      Mobility  Bed Mobility Overal bed mobility: Modified Independent                  Transfers                   General transfer comment: as per OT notes    Ambulation/Gait Ambulation/Gait assistance: Modified independent (Device/Increase time) Gait Distance (Feet): 100 Feet Assistive device: None Gait Pattern/deviations: Decreased step length - right, Decreased step length - left, Decreased stride length Gait velocity: decreased     General Gait Details: slightly unsteady cadence without loss of balance with good return for ambulation on level, inclined and declined surfaces without loss of balance  Stairs Stairs: Yes Stairs assistance: Modified independent (Device/Increase time) Stair Management: One rail Right, One rail Left, Step to pattern Number of Stairs: 5 General stair comments: demonstrates good return for going up/down stairs using 1 side rail with step to patttern without loss of balance  Wheelchair Mobility    Modified Rankin (Stroke Patients Only)       Balance Overall balance assessment: Mild deficits observed, not formally tested                                           Pertinent Vitals/Pain Pain Assessment Pain Assessment: No/denies pain    Home Living Family/patient expects to be discharged to:: Private residence Living Arrangements: Spouse/significant other Available Help at Discharge: Available PRN/intermittently Type  of Home: House Home Access: Stairs to enter Entrance Stairs-Rails: None Entrance Stairs-Number of Steps: 1 Alternate Level Stairs-Number of Steps: 12 Home Layout: Laundry or work area in basement;Two level Home Equipment: Conservation officer, nature (2 wheels);Cane - single  point;BSC/3in1;Grab bars - tub/shower      Prior Function Prior Level of Function : Needs assist       Physical Assist : Mobility (physical);ADLs (physical) Mobility (physical): Bed mobility;Transfers;Stairs;Gait ADLs (physical): IADLs Mobility Comments: Pt is a household and short distanced community ambulator without AD. Pt reports her husnband will help her with stairs PRN. ADLs Comments: Pt reports independence for ADL's. Pt cooks some and does not drive. Pt still goes to the grocery store with assist.     Hand Dominance   Dominant Hand: Right    Extremity/Trunk Assessment   Upper Extremity Assessment Upper Extremity Assessment: Defer to OT evaluation    Lower Extremity Assessment Lower Extremity Assessment: Overall WFL for tasks assessed    Cervical / Trunk Assessment Cervical / Trunk Assessment: Kyphotic  Communication   Communication: No difficulties  Cognition Arousal/Alertness: Awake/alert Behavior During Therapy: WFL for tasks assessed/performed Overall Cognitive Status: Within Functional Limits for tasks assessed                                          General Comments      Exercises     Assessment/Plan    PT Assessment Patient does not need any further PT services  PT Problem List         PT Treatment Interventions      PT Goals (Current goals can be found in the Care Plan section)  Acute Rehab PT Goals Patient Stated Goal: return home with family to assist PT Goal Formulation: With patient/family Time For Goal Achievement: 09/04/21 Potential to Achieve Goals: Good    Frequency       Co-evaluation PT/OT/SLP Co-Evaluation/Treatment: Yes Reason for Co-Treatment: To address functional/ADL transfers PT goals addressed during session: Mobility/safety with mobility;Balance OT goals addressed during session: ADL's and self-care       AM-PAC PT "6 Clicks" Mobility  Outcome Measure Help needed turning from your back to  your side while in a flat bed without using bedrails?: None Help needed moving from lying on your back to sitting on the side of a flat bed without using bedrails?: None Help needed moving to and from a bed to a chair (including a wheelchair)?: None Help needed standing up from a chair using your arms (e.g., wheelchair or bedside chair)?: None Help needed to walk in hospital room?: None Help needed climbing 3-5 steps with a railing? : A Little 6 Click Score: 23    End of Session   Activity Tolerance: Patient tolerated treatment well Patient left: in bed;with call bell/phone within reach;with family/visitor present (seated at EOB) Nurse Communication: Mobility status PT Visit Diagnosis: Unsteadiness on feet (R26.81);Other abnormalities of gait and mobility (R26.89);Muscle weakness (generalized) (M62.81)    Time: 1062-6948 PT Time Calculation (min) (ACUTE ONLY): 20 min   Charges:   PT Evaluation $PT Eval Moderate Complexity: 1 Mod PT Treatments $Therapeutic Activity: 8-22 mins        12:17 PM, 09/04/21 Lonell Grandchild, MPT Physical Therapist with [facility] [phone] office [phone] mobile phone

## 2021-09-04 NOTE — Progress Notes (Signed)
*  PRELIMINARY RESULTS* Echocardiogram 2D Echocardiogram has been performed.  Rebekah King 09/04/2021, 12:32 PM

## 2021-09-04 NOTE — Plan of Care (Signed)
°  Problem: Education: Goal: Knowledge of General Education information will improve Description: Including pain rating scale, medication(s)/side effects and non-pharmacologic comfort measures Outcome: Progressing   Problem: Health Behavior/Discharge Planning: Goal: Ability to manage health-related needs will improve Outcome: Progressing   Problem: Clinical Measurements: Goal: Ability to maintain clinical measurements within normal limits will improve Outcome: Progressing Goal: Will remain free from infection Outcome: Progressing Goal: Diagnostic test results will improve Outcome: Progressing Goal: Respiratory complications will improve Outcome: Progressing Goal: Cardiovascular complication will be avoided Outcome: Progressing   Problem: Activity: Goal: Risk for activity intolerance will decrease Outcome: Progressing   Problem: Nutrition: Goal: Adequate nutrition will be maintained Outcome: Progressing   Problem: Coping: Goal: Level of anxiety will decrease Outcome: Progressing   Problem: Elimination: Goal: Will not experience complications related to bowel motility Outcome: Progressing Goal: Will not experience complications related to urinary retention Outcome: Progressing   Problem: Pain Managment: Goal: General experience of comfort will improve Outcome: Progressing   Problem: Safety: Goal: Ability to remain free from injury will improve Outcome: Progressing   Problem: Skin Integrity: Goal: Risk for impaired skin integrity will decrease Outcome: Progressing   Problem: Education: Goal: Knowledge of secondary prevention will improve (SELECT ALL) Outcome: Progressing Goal: Knowledge of patient specific risk factors will improve (INDIVIDUALIZE FOR PATIENT) Outcome: Progressing   

## 2021-09-04 NOTE — Discharge Instructions (Signed)
PLEASE TAKE ASPIRIN 81 MG DAILY WITH PLAVIX FOR NEXT 19 DAYS STARTING 09/05/21 THEN STOP ASPIRIN BUT CONTINUE TAKING PLAVIX DAILY.   PLEASE WORK WITH YOUR PRIMARY CARE PROVIDER TO GET YOUR BLOOD PRESSURE BETTER CONTROLLED.     PLEASE ESTABLISH CARE WITH A NEUROLOGIST REGARDING TIA AND MEMORY LOSS.     IMPORTANT INFORMATION: PAY CLOSE ATTENTION   PHYSICIAN DISCHARGE INSTRUCTIONS  Follow with Primary care provider  Sasser, Silvestre Moment, MD  and other consultants as instructed by your Hospitalist Physician  [place] IF SYMPTOMS COME BACK, WORSEN OR NEW PROBLEM DEVELOPS   Please note: You were cared for by a hospitalist during your hospital stay. Every effort will be made to forward records to your primary care provider.  You can request that your primary care provider send for your hospital records if they have not received them.  Once you are discharged, your primary care physician will handle any further medical issues. Please note that NO REFILLS for any discharge medications will be authorized once you are discharged, as it is imperative that you return to your primary care physician (or establish a relationship with a primary care physician if you do not have one) for your post hospital discharge needs so that they can reassess your need for medications and monitor your lab values.  Please get a complete blood count and chemistry panel checked by your Primary MD at your next visit, and again as instructed by your Primary MD.  Get Medicines reviewed and adjusted: Please take all your medications with you for your next visit with your Primary MD  Laboratory/radiological data: Please request your Primary MD to go over all hospital tests and procedure/radiological results at the follow up, please ask your primary care provider to get all Hospital records sent to his/her office.  In some cases, they will be blood work, cultures and biopsy results pending at the  time of your discharge. Please request that your primary care provider follow up on these results.  If you are diabetic, please bring your blood sugar readings with you to your follow up appointment with primary care.    Please call and make your follow up appointments as soon as possible.    Also Note the following: If you experience worsening of your admission symptoms, develop shortness of breath, life threatening emergency, suicidal or homicidal thoughts you must seek medical attention immediately by calling 911 or calling your MD immediately  if symptoms less severe.  You must read complete instructions/literature along with all the possible adverse reactions/side effects for all the Medicines you take and that have been prescribed to you. Take any new Medicines after you have completely understood and accpet all the possible adverse reactions/side effects.   Do not drive when taking Pain medications or sleeping medications (Benzodiazepines)  Do not take more than prescribed Pain, Sleep and Anxiety Medications. It is not advisable to combine anxiety,sleep and pain medications without talking with your primary care practitioner  Special Instructions: If you have smoked or chewed Tobacco  in the last 2 yrs please stop smoking, stop any regular Alcohol  and or any Recreational drug use.  Wear Seat belts while driving.  Do not drive if taking any narcotic, mind altering or controlled substances or recreational drugs or alcohol.

## 2021-09-04 NOTE — Discharge Summary (Addendum)
Physician Discharge Summary  Rebekah King HQI:[id number] DOB: 28-Nov-1940 DOA: 09/03/2021  PCP: Manon Hilding, MD  Admit date: 09/03/2021 Discharge date: 09/04/2021  Admitted From:  Home  Disposition:  Home   Recommendations for Outpatient Follow-up:  Follow up with PCP in 1 weeks Establish care with neurologist in 1 month  Please work with patient to get blood pressure under better control.  Please follow up on the following pending results: final 2D echocardiogram results pending at time of discharge   Discharge Condition: STABLE   CODE STATUS: FULL DIET: 2 gm sodium    Brief Hospitalization Summary: Please see all hospital notes, images, labs for full details of the hospitalization. ADMISSION HPI: Rebekah King is a 81 year old female with hypercholesterolemia, GERD, osteoporosis, arthritis, hypothyroidism, mild cognitive impairment, bilateral sensorineural hearing loss, chronic cystitis and pseudophakia of both eyes reported that she has been having transient right hand numbness for the past several days and today she had a prolonged period of difficulty finding words that lasted about 45 minutes.  Fortunately her symptoms have returned to baseline.  She presented to the emergency department very hypertensive.  She was seen by telemetry neurology who recommended TIA work-up.  Her symptoms could also have been related to a hypertensive urgency.  The recommendation was for her to have an observation admission and further recommendations if her MRI is positive for acute ischemic infarction.  Hospital Course  Patient was admitted for TIA work-up.  She had an MRI brain negative for acute ischemic infarct.  She also had a CTA of the head and neck that did not show any large vessel occlusion.  Fortunately her symptoms resolved completely and she has returned to baseline.  Telemetry neurology was consulted and they recommended that she take aspirin 81 mg and Plavix for 21 days followed by Plavix  alone.  Instructions given to patient.  Her hypertension has been difficult to control and we have added amlodipine and hydralazine.  Ambulatory referral to neurology made.  Ambulatory referral for hypertension made.  2D echocardiogram is still pending at time of discharge.  Patient instructed to follow-up outpatient for results.    Discharge Diagnoses:  Principal Problem:   TIA (transient ischemic attack) Active Problems:   Speech abnormality   Gastroesophageal reflux disease   Hypercholesterolemia   Discharge Instructions: Discharge Instructions     Ambulatory referral to [facility] [place]   Complete by: As directed    Advanced Hypertension   Ambulatory referral to Neurology   Complete by: As directed    An appointment is requested in approximately: 4 weeks      Allergies as of 09/04/2021       Reactions   Brimonidine Other (See Comments)   Severe dry mouth   Timolol Other (See Comments)   Lightheaded   Misc. Sulfonamide Containing Compounds    Amoxicillin-pot Clavulanate Diarrhea   GI pain   Sulfa Antibiotics Nausea Only, Rash   Hands go numb        Medication List     TAKE these medications    amLODipine 5 MG tablet Commonly known as: NORVASC Take 1 tablet (5 mg total) by mouth daily. Start taking on: September 05, 2021   aspirin 81 MG EC tablet Take 1 tablet (81 mg total) by mouth daily for 19 days. Swallow whole. Start taking on: September 05, 2021   CALCIUM 600+D3 PO Take 1 tablet by mouth in the morning and at bedtime.  clopidogrel 75 MG tablet Commonly known as: PLAVIX Take 1 tablet (75 mg total) by mouth daily. Start taking on: September 05, 2021   dicyclomine 10 MG capsule Commonly known as: BENTYL Take 10 mg by mouth 4 (four) times daily.   dorzolamide 2 % ophthalmic solution Commonly known as: TRUSOPT Place 1 drop into both eyes 3 (three) times daily.   famotidine 20 MG tablet Commonly known as:  PEPCID Take 20 mg by mouth 2 (two) times daily.   hydrALAZINE 10 MG tablet Commonly known as: APRESOLINE Take 1 tablet (10 mg total) by mouth 3 (three) times daily.   latanoprost 0.005 % ophthalmic solution Commonly known as: XALATAN Place 1 drop into both eyes at bedtime.   levothyroxine 50 MCG tablet Commonly known as: SYNTHROID Take 50 mcg by mouth daily before breakfast.   losartan 100 MG tablet Commonly known as: COZAAR Take 100 mg by mouth daily.   meloxicam 15 MG tablet Commonly known as: MOBIC Take 1 tablet (15 mg total) by mouth daily as needed for pain. What changed:  when to take this reasons to take this   multivitamin with minerals Tabs tablet Take 1 tablet by mouth daily.   nitrofurantoin 50 MG capsule Commonly known as: MACRODANTIN Take 1 capsule (50 mg total) by mouth at bedtime. Take 1 capsule (50 mg total) by mouth at bedtime.   pantoprazole 40 MG tablet Commonly known as: PROTONIX Take 40 mg by mouth daily.   simvastatin 20 MG tablet Commonly known as: ZOCOR Take 20 mg by mouth every evening.   vitamin C with rose hips 500 MG tablet Take 500 mg by mouth daily.        Follow-up Information     Sasser, Silvestre Moment, MD. Schedule an appointment as soon as possible for a visit in 1 week(s).   Specialty: Family Medicine Why: Hospital Follow Up Contact information: [address] [address] [phone]                Allergies  Allergen Reactions   Brimonidine Other (See Comments)    Severe dry mouth   Timolol Other (See Comments)    Lightheaded   Misc. Sulfonamide Containing Compounds    Amoxicillin-Pot Clavulanate Diarrhea    GI pain     Sulfa Antibiotics Nausea Only and Rash    Hands go numb   Allergies as of 09/04/2021       Reactions   Brimonidine Other (See Comments)   Severe dry mouth   Timolol Other (See Comments)   Lightheaded   Misc. Sulfonamide Containing Compounds    Amoxicillin-pot Clavulanate Diarrhea    GI pain   Sulfa Antibiotics Nausea Only, Rash   Hands go numb        Medication List     TAKE these medications    amLODipine 5 MG tablet Commonly known as: NORVASC Take 1 tablet (5 mg total) by mouth daily. Start taking on: September 05, 2021   aspirin 81 MG EC tablet Take 1 tablet (81 mg total) by mouth daily for 19 days. Swallow whole. Start taking on: September 05, 2021   CALCIUM 600+D3 PO Take 1 tablet by mouth in the morning and at bedtime.   clopidogrel 75 MG tablet Commonly known as: PLAVIX Take 1 tablet (75 mg total) by mouth daily. Start taking on: September 05, 2021   dicyclomine 10 MG capsule Commonly known as: BENTYL Take 10 mg by mouth 4 (four) times daily.  dorzolamide 2 % ophthalmic solution Commonly known as: TRUSOPT Place 1 drop into both eyes 3 (three) times daily.   famotidine 20 MG tablet Commonly known as: PEPCID Take 20 mg by mouth 2 (two) times daily.   hydrALAZINE 10 MG tablet Commonly known as: APRESOLINE Take 1 tablet (10 mg total) by mouth 3 (three) times daily.   latanoprost 0.005 % ophthalmic solution Commonly known as: XALATAN Place 1 drop into both eyes at bedtime.   levothyroxine 50 MCG tablet Commonly known as: SYNTHROID Take 50 mcg by mouth daily before breakfast.   losartan 100 MG tablet Commonly known as: COZAAR Take 100 mg by mouth daily.   meloxicam 15 MG tablet Commonly known as: MOBIC Take 1 tablet (15 mg total) by mouth daily as needed for pain. What changed:  when to take this reasons to take this   multivitamin with minerals Tabs tablet Take 1 tablet by mouth daily.   nitrofurantoin 50 MG capsule Commonly known as: MACRODANTIN Take 1 capsule (50 mg total) by mouth at bedtime. Take 1 capsule (50 mg total) by mouth at bedtime.   pantoprazole 40 MG tablet Commonly known as: PROTONIX Take 40 mg by mouth daily.   simvastatin 20 MG tablet Commonly known as: ZOCOR Take 20 mg by mouth every evening.    vitamin C with rose hips 500 MG tablet Take 500 mg by mouth daily.        Procedures/Studies: CT ANGIO HEAD NECK W WO CM  Result Date: 09/03/2021 CLINICAL DATA:  Stroke, follow up EXAM: CT ANGIOGRAPHY HEAD AND NECK TECHNIQUE: Multidetector CT imaging of the head and neck was performed using the standard protocol during bolus administration of intravenous contrast. Multiplanar CT image reconstructions and MIPs were obtained to evaluate the vascular anatomy. Carotid stenosis measurements (when applicable) are obtained utilizing NASCET criteria, using the distal internal carotid diameter as the denominator. RADIATION DOSE REDUCTION: This exam was performed according to the departmental dose-optimization program which includes automated exposure control, adjustment of the mA and/or kV according to patient size and/or use of iterative reconstruction technique. CONTRAST:  55mL OMNIPAQUE IOHEXOL 350 MG/ML SOLN COMPARISON:  None. FINDINGS: CTA NECK Aortic arch: Mild calcified plaque along the arch and patent great vessel origins. Right carotid system: Patent.  No stenosis. Left carotid system: Patent.  No stenosis. Vertebral arteries: Patent. Left vertebral is mildly dominant. No stenosis. Skeleton: Cervical spine degenerative changes. Temporomandibular joint degenerative changes bilaterally. 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 Other neck: Unremarkable. Upper chest: No apical lung mass. Review of the MIP images confirms the above findings CTA HEAD Anterior circulation: Intracranial internal carotid arteries are patent with mild calcified plaque. Anterior and middle cerebral arteries are patent. Left A1 ACA is dominant. Posterior circulation: Intracranial vertebral arteries are patent. Basilar artery is patent. Major cerebellar artery origins are patent. Right posterior communicating artery is present. Posterior cerebral arteries  are patent. Venous sinuses: Patent as allowed by contrast bolus timing. Review of the MIP images confirms the above findings IMPRESSION: No large vessel occlusion, hemodynamically significant stenosis, or evidence of dissection. Electronically Signed   By: Macy Mis M.D.   On: 09/03/2021 15:42   CT Head Wo Contrast  Result Date: 09/03/2021 CLINICAL DATA:  Transient ischemic attack (TIA) EXAM: CT HEAD WITHOUT CONTRAST TECHNIQUE: Contiguous axial images were obtained from the base of the skull through the vertex without intravenous contrast. RADIATION DOSE REDUCTION: This exam was  performed according to the departmental dose-optimization program which includes automated exposure control, adjustment of the mA and/or kV according to patient size and/or use of iterative reconstruction technique. COMPARISON:  No images retrievable at the time of this dictation. FINDINGS: Brain: No evidence of acute intracranial hemorrhage or extra-axial collection.No evidence of mass lesion/concern mass effect.The ventricles are normal in size.Confluent periventricular and subcortical white matter hypoattenuation, which is nonspecific but likely sequela of chronic small vessel ischemic disease.Mild cerebral atrophy Vascular: No hyperdense vessel or unexpected calcification. 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 No acute fracture. Chronic nasal bone deformities. Sinuses/Orbits: Right posterior ethmoid air cell opacification. The orbits are unremarkable. Other: None. IMPRESSION: No acute intracranial abnormality. Moderate sequela of chronic small vessel ischemic disease. 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 Opacified right posterior ethmoid air cells. Electronically Signed   By: Maurine Simmering M.D.   On: 09/03/2021 14:23    MR BRAIN WO CONTRAST  Result Date: 09/03/2021 CLINICAL DATA:  Mental status change, unknown cause EXAM: MRI HEAD WITHOUT CONTRAST TECHNIQUE: Multiplanar, multiecho pulse sequences of the brain and surrounding structures were obtained without intravenous contrast. COMPARISON:  None. FINDINGS: Brain: There is no acute infarction or intracranial hemorrhage. There is no intracranial mass, mass effect, or edema. There is no hydrocephalus or extra-axial fluid collection. Ventricles and sulci are within normal limits in size and configuration. Patchy and confluent areas of T2 hyperintensity in the supratentorial greater than pontine white matter are nonspecific but may reflect moderate to advanced chronic microvascular ischemic changes. There are a few scattered punctate foci of susceptibility in the cerebral white matter likely reflecting chronic microhemorrhages. Vascular: Major vessel flow voids at the skull base are preserved. Skull and upper cervical spine: Normal marrow signal is preserved. Sinuses/Orbits: Minor mucosal thickening.  Orbits are unremarkable. Other: Sella is unremarkable.  Mastoid air cells are clear. IMPRESSION: No acute infarction, hemorrhage, or mass. Moderate to marked chronic microvascular ischemic changes. Electronically Signed   By: Macy Mis M.D.   On: 09/03/2021 15:28   DG Chest Port 1 View  Result Date: 09/03/2021 CLINICAL DATA:  Weakness, episode of RIGHT hand numbness and tingling, could not speak for 45 minutes this morning EXAM: PORTABLE CHEST 1 VIEW COMPARISON:  Portable exam 1318 hours compared to 03/25/2021 FINDINGS: Upper normal heart size. Mediastinal contours and pulmonary vascularity normal. Atherosclerotic calcification aorta. Bronchitic changes without infiltrate, pleural effusion, or pneumothorax. Bones demineralized levoconvex thoracolumbar scoliosis and RIGHT shoulder prosthesis noted. IMPRESSION: No acute abnormalities. Aortic Atherosclerosis (ICD10-I70.0).  Electronically Signed   By: Lavonia Dana M.D.   On: 09/03/2021 13:30     Subjective: Pt reports that she feels fine.  She feels back to baseline.  No complaints.  No speech or swallowing issues.  No weakness.   Discharge Exam: Vitals:   09/04/21 0801 09/04/21 1014  BP: (!) 171/76 (!) 157/68  Pulse: 70 73  Resp: 18 18  Temp: 98.3 F (36.8 C) 98 F (36.7 C)  SpO2: 98% 100%   Vitals:   09/04/21 0429 09/04/21 0600 09/04/21 0801 09/04/21 1014  BP: (!) 157/98 (!) 170/70 (!) 171/76 (!) 157/68  Pulse: 73 79 70 73  Resp: 16 16 18 18   Temp: (!) 97.5 F (36.4 C) (!) 97.4 F (36.3 C) 98.3 F (36.8 C)  52 F (36.7 C)  TempSrc: Oral Oral Oral Oral  SpO2: 97% 99% 98% 100%  Weight:      Height:        General: Pt is alert, awake, not in acute distress Cardiovascular: RRR, S1/S2 +, no rubs, no gallops Respiratory: CTA bilaterally, no wheezing, no rhonchi Abdominal: Soft, NT, ND, bowel sounds + Extremities: no edema, no cyanosis Neurological: nonfocal exam.    The results of significant diagnostics from this hospitalization (including imaging, microbiology, ancillary and laboratory) are listed below for reference.     Microbiology: No results found for this or any previous visit (from the past 240 hour(s)).   Labs: BNP (last 3 results) No results for input(s): BNP in the last 8760 hours. Basic Metabolic Panel: Recent Labs  Lab 09/03/21 1257 09/04/21 0523  NA 133* 133*  K 4.3 4.0  CL 103 105  CO2 22 21*  GLUCOSE 99 91  BUN 16 15  CREATININE 0.75 0.75  CALCIUM 9.3 8.9   Liver Function Tests: Recent Labs  Lab 09/03/21 1257 09/04/21 0523  AST 28 25  ALT 25 23  ALKPHOS 63 60  BILITOT 0.6 0.5  PROT 6.8 6.5  ALBUMIN 4.5 4.3   No results for input(s): LIPASE, AMYLASE in the last 168 hours. No results for input(s): AMMONIA in the last 168 hours. CBC: Recent Labs  Lab 09/03/21 1257 09/04/21 0523  WBC 5.0 5.4  NEUTROABS 3.6  --   HGB 11.8* 11.6*  HCT 34.7* 34.0*   MCV 99.1 98.6  PLT 197 202   Cardiac Enzymes: No results for input(s): CKTOTAL, CKMB, CKMBINDEX, TROPONINI in the last 168 hours. BNP: Invalid input(s): POCBNP CBG: No results for input(s): GLUCAP in the last 168 hours. D-Dimer No results for input(s): DDIMER in the last 72 hours. Hgb A1c Recent Labs    09/03/21 1257  HGBA1C 5.4   Lipid Profile Recent Labs    09/04/21 0523  CHOL 145  HDL 53  LDLCALC 70  TRIG 110  CHOLHDL 2.7   Thyroid function studies No results for input(s): TSH, T4TOTAL, T3FREE, THYROIDAB in the last 72 hours.  Invalid input(s): FREET3 Anemia work up No results for input(s): VITAMINB12, FOLATE, FERRITIN, TIBC, IRON, RETICCTPCT in the last 72 hours. Urinalysis    Component Value Date/Time   COLORURINE AMBER (A) 03/25/2021 2139   APPEARANCEUR Clear 06/27/2021 1524   LABSPEC 1.009 03/25/2021 2139   PHURINE 5.0 03/25/2021 2139   GLUCOSEU Negative 06/27/2021 1524   HGBUR SMALL (A) 03/25/2021 2139   BILIRUBINUR Negative 06/27/2021 [place] 03/25/2021 2139   PROTEINUR Negative 06/27/2021 [place] 03/25/2021 2139   UROBILINOGEN negative (A) 09/01/2019 1100   NITRITE Negative 06/27/2021 1524   NITRITE POSITIVE (A) 03/25/2021 2139   LEUKOCYTESUR Negative 06/27/2021 1524   LEUKOCYTESUR NEGATIVE 03/25/2021 2139   Sepsis Labs Invalid input(s): PROCALCITONIN,  WBC,  LACTICIDVEN Microbiology No results found for this or any previous visit (from the past 240 hour(s)).  Time coordinating discharge:   SIGNED:  Irwin Brakeman, MD  [facility] 09/04/2021, 11:44 AM How to contact the [facility] Attending or Consulting provider [place] or covering provider during after hours [place], for this patient?  Check the care team in [facility] and look for a) attending/consulting [facility] provider listed and b) the [facility] team listed Log into [email or web address] and use [place]'s universal password to access. If you do not have the password,  please contact the hospital operator.  Locate the [facility] provider you are looking for under [facility] and page to a number that you can be directly reached. If you still have difficulty reaching the provider, please page the [facility] (Director on Call) for the Hospitalists listed on amion for assistance.

## 2021-09-04 NOTE — Evaluation (Signed)
Occupational Therapy Evaluation Patient Details Name: Rebekah King MRN: [id number] DOB: May 12, 1941 Today's Date: 09/04/2021   History of Present Illness Rebekah King is a 81 year old female with hypercholesterolemia, GERD, osteoporosis, arthritis, hypothyroidism, mild cognitive impairment, bilateral sensorineural hearing loss, chronic cystitis and pseudophakia of both eyes reported that she has been having transient right hand numbness for the past several days and today she had a prolonged period of difficulty finding words that lasted about 45 minutes.  Fortunately her symptoms have returned to baseline.  She presented to the emergency department very hypertensive.  She was seen by telemetry neurology who recommended TIA work-up.  Her symptoms could also have been related to a hypertensive urgency.  The recommendation was for her to have an observation admission and further recommendations if her MRI is positive for acute ischemic infarction.   Clinical Impression   Pt agreeable to OT and PT co-evaluation. Pt reported back issues at baseline with mild balance deficits at baseline as well. Pt is mostly independent at baseline with husband to assist PRN for IADL's. Pt denied numbness and tingling or change in vision but does demonstrate moderate difficulty in visual tracking with lack of fluidity and mild delayed tracking. Pt at a level of mod I to supervision for transfers due to mild balance deficits without AD. Pt appears to be near baseline levels and is not recommended for further acute OT services. Pt will be discharged to care of nursing staff for remaining length of stay.      Recommendations for follow up therapy are one component of a multi-disciplinary discharge planning process, led by the attending physician.  Recommendations may be updated based on patient status, additional functional criteria and insurance authorization.   Follow Up Recommendations  No OT follow up     Assistance Recommended at Discharge PRN  Patient can return home with the following      Functional Status Assessment  Patient has not had a recent decline in their functional status  Equipment Recommendations  None recommended by OT           Precautions / Restrictions Precautions Precautions: Fall Restrictions Weight Bearing Restrictions: No      Mobility Bed Mobility Overal bed mobility: Modified Independent                  Transfers Overall transfer level: Needs assistance   Transfers: Sit to/from Stand, Bed to chair/wheelchair/BSC Sit to Stand: Supervision, Modified independent (Device/Increase time)     Step pivot transfers: Modified independent (Device/Increase time), Supervision     General transfer comment: Pt mildly unsteady at times when transfering via ambulation, but likely near baseline levels per pt report.      Balance Overall balance assessment: Mild deficits observed, not formally tested                                         ADL either performed or assessed with clinical judgement   ADL Overall ADL's : Needs assistance/impaired                         Toilet Transfer: Supervision/safety;Ambulation;Modified Independent Toilet Transfer Details (indicate cue type and reason): Partially simulated via ambulation in hall from bed and back.         Functional mobility during ADLs: Modified independent;Supervision/safety General ADL Comments: Mild balance deficits noted but pt  reports balance is a defciit area at baseline.     Vision Baseline Vision/History: 1 Wears glasses;3 Glaucoma Ability to See in Adequate Light: 1 Impaired Patient Visual Report: No change from baseline Vision Assessment?: Yes Additional Comments: Pt demonstrated moderately impaired fluidity of visual tracking with difficulty dissociation head and eye movements. Likely a baseline issue.                Pertinent Vitals/Pain Pain  Assessment Pain Assessment: No/denies pain     Hand Dominance Right   Extremity/Trunk Assessment Upper Extremity Assessment Upper Extremity Assessment: Generalized weakness   Lower Extremity Assessment Lower Extremity Assessment: Defer to PT evaluation   Cervical / Trunk Assessment Cervical / Trunk Assessment: Kyphotic   Communication Communication Communication: No difficulties   Cognition Arousal/Alertness: Awake/alert Behavior During Therapy: WFL for tasks assessed/performed Overall Cognitive Status: Within Functional Limits for tasks assessed                                                        Home Living Family/patient expects to be discharged to:: Private residence Living Arrangements: Spouse/significant other Available Help at Discharge: Available PRN/intermittently Type of Home: House Home Access: Stairs to enter Technical brewer of Steps: 1 Entrance Stairs-Rails: None Home Layout: Laundry or work area in basement;Two level Alternate Level Stairs-Number of Steps: 12 Alternate Level Stairs-Rails: Right (going down) Bathroom Shower/Tub: Teacher, early years/pre: Handicapped height Bathroom Accessibility: Yes How Accessible: Accessible via walker Home Equipment: Eagle (2 wheels);Cane - single point;BSC/3in1;Grab bars - tub/shower          Prior Functioning/Environment Prior Level of Function : Needs assist       Physical Assist : Mobility (physical);ADLs (physical) Mobility (physical): Stairs ADLs (physical): IADLs Mobility Comments: Pt is a household ambulator without AD. Pt reports her husnband will help her with stairs PRN. ADLs Comments: Pt reports independence for ADL's. Pt cooks some and does not drive. Pt still goes to the grocery store with assist.                      OT Goals(Current goals can be found in the care plan section) Acute Rehab OT Goals Patient Stated Goal: return home  OT  Frequency:      Co-evaluation PT/OT/SLP Co-Evaluation/Treatment: Yes Reason for Co-Treatment: To address functional/ADL transfers   OT goals addressed during session: ADL's and self-care      AM-PAC OT "6 Clicks" Daily Activity     Outcome Measure Help from another person eating meals?: None Help from another person taking care of personal grooming?: None Help from another person toileting, which includes using toliet, bedpan, or urinal?: None Help from another person bathing (including washing, rinsing, drying)?: None Help from another person to put on and taking off regular upper body clothing?: None Help from another person to put on and taking off regular lower body clothing?: None 6 Click Score: 24   End of Session    Activity Tolerance: Patient tolerated treatment well Patient left: in bed;with call bell/phone within reach  OT Visit Diagnosis: Unsteadiness on feet (R26.81);Other abnormalities of gait and mobility (R26.89);Muscle weakness (generalized) (M62.81)                Time: 4098-1191 OT Time Calculation (min): 15 min Charges:  OT  General Charges $OT Visit: 1 Visit OT Evaluation $OT Eval Low Complexity: 1 Low  Chrisangel Eskenazi OT, MOT  Larey Seat 09/04/2021, 9:29 AM

## 2021-09-04 NOTE — Progress Notes (Signed)
SLP Cancellation Note  Patient Details Name: Rebekah King MRN: [id number] DOB: 1941-04-21   Cancelled treatment:       Reason Eval/Treat Not Completed: SLP screened, no needs identified, will sign off; MRI negative for infarct. ST will s/o at this time; pt back at baseline per pt/husband's report.   Elvina Sidle, M.S., [place] 09/04/2021, 1:07 PM

## 2021-09-04 NOTE — TOC Progression Note (Signed)
°  Transition of Care [facility]) Screening Note   Patient Details  Name: AFREEN SIEBELS Date of Birth: [birth year]   Transition of Care [facility]) CM/SW Contact:    Shade Flood, LCSW Phone Number: 09/04/2021, 11:27 AM    Transition of Care Department [facility]) has reviewed patient and no TOC needs have been identified at this time. We will continue to monitor patient advancement through interdisciplinary progression rounds. If new patient transition needs arise, please place a TOC consult.

## 2021-09-25 ENCOUNTER — Encounter (INDEPENDENT_AMBULATORY_CARE_PROVIDER_SITE_OTHER): Payer: Self-pay | Admitting: *Deleted

## 2021-09-27 ENCOUNTER — Ambulatory Visit: Payer: Medicare Other | Admitting: Neurology

## 2021-09-27 ENCOUNTER — Encounter: Payer: Self-pay | Admitting: Neurology

## 2021-09-27 VITALS — BP 152/55 | HR 66 | Ht 62.0 in | Wt 121.0 lb

## 2021-09-27 DIAGNOSIS — G459 Transient cerebral ischemic attack, unspecified: Secondary | ICD-10-CM | POA: Diagnosis not present

## 2021-09-27 DIAGNOSIS — I10 Essential (primary) hypertension: Secondary | ICD-10-CM

## 2021-09-27 MED ORDER — HYDRALAZINE HCL 10 MG PO TABS: 10.0000 mg | ORAL_TABLET | Freq: Three times a day (TID) | ORAL | 2 refills | Status: DC

## 2021-09-27 MED ORDER — CLOPIDOGREL BISULFATE 75 MG PO TABS: 75.0000 mg | ORAL_TABLET | Freq: Every day | ORAL | 11 refills | Status: DC

## 2021-09-27 NOTE — Progress Notes (Signed)
[facility]  PATIENT: Rebekah King DOB: April 06, 1941  REQUESTING CLINICIAN: Murlean Iba, MD HISTORY FROM: Patient  REASON FOR VISIT: TIA   HISTORICAL  CHIEF COMPLAINT:  Chief Complaint  Patient presents with   New Patient (Initial Visit)    Rm 50, with husband  NP internal referral for TIA/ Pt states she is doing well, reports no other sx, here to discuss stroke prevention     HISTORY OF PRESENT ILLNESS:  This is a 81 year old woman with past medical history of hypertension, hyperlipidemia, hypothyroidism and glaucoma who is presenting after hospitalization for TIA.  Patient reports on February 6 she was on the phone and could not talk, she reported her words were gibberish.  She tried to call her husband but could not remember his phone number.  Husband was able to come home and took her to the hospital.  Patient reports the entire episode lasted less than an hour.  She reported 4 days prior she did have right hand numbness.  In the ED she was noted to be very hypertensive, more than 284 systolic, she did have a MRI brain which was negative for any acute stroke and a CT angiogram head and neck did not show any large vessel occlusion or stenosis.  She was discharged home with DAPT, aspirin and Plavix for total of 21 days and plan to continue with Plavix thereafter.  She was also started on hydralazine and amlodipine.  Patient reports compliance with the medication.  She has not follow-up with the hypertensive clinic yet.  Denies any additional symptoms since leaving the hospital.    OTHER MEDICAL CONDITIONS: Hypertension, Glaucoma, Hypothyroidism, Hyperlipidemia    REVIEW OF SYSTEMS: Full 14 system review of systems performed and negative with exception of: as noted in the HPI.  ALLERGIES: Allergies  Allergen Reactions   Brimonidine Other (See Comments)    Severe dry mouth   Timolol Other (See Comments)    Lightheaded   Misc. Sulfonamide  Containing Compounds    Amoxicillin-Pot Clavulanate Diarrhea    GI pain     Sulfa Antibiotics Nausea Only and Rash    Hands go numb    HOME MEDICATIONS: Outpatient Medications Prior to Visit  Medication Sig Dispense Refill   amLODipine (NORVASC) 5 MG tablet Take 1 tablet (5 mg total) by mouth daily. 30 tablet 1   Ascorbic Acid (VITAMIN C WITH ROSE HIPS) 500 MG tablet Take 500 mg by mouth daily.     Calcium Carb-Cholecalciferol (CALCIUM 600+D3 PO) Take 1 tablet by mouth in the morning and at bedtime.     dicyclomine (BENTYL) 10 MG capsule Take 10 mg by mouth 4 (four) times daily.     dorzolamide (TRUSOPT) 2 % ophthalmic solution Place 1 drop into both eyes 3 (three) times daily.      famotidine (PEPCID) 20 MG tablet Take 20 mg by mouth 2 (two) times daily.     latanoprost (XALATAN) 0.005 % ophthalmic solution Place 1 drop into both eyes at bedtime.     levothyroxine (SYNTHROID, LEVOTHROID) 50 MCG tablet Take 50 mcg by mouth daily before breakfast.     losartan (COZAAR) 100 MG tablet Take 100 mg by mouth daily.     meloxicam (MOBIC) 15 MG tablet Take 1 tablet (15 mg total) by mouth daily as needed for pain.     Multiple Vitamin (MULTIVITAMIN WITH MINERALS) TABS tablet Take 1 tablet by mouth daily.     nitrofurantoin (MACRODANTIN) 50 MG capsule Take  1 capsule (50 mg total) by mouth at bedtime. Take 1 capsule (50 mg total) by mouth at bedtime. 30 capsule 11   pantoprazole (PROTONIX) 40 MG tablet Take 40 mg by mouth daily.     simvastatin (ZOCOR) 20 MG tablet Take 20 mg by mouth every evening.     clopidogrel (PLAVIX) 75 MG tablet Take 1 tablet (75 mg total) by mouth daily. 30 tablet 2   hydrALAZINE (APRESOLINE) 10 MG tablet Take 1 tablet (10 mg total) by mouth 3 (three) times daily. 90 tablet 1   No facility-administered medications prior to visit.    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 No pertinent family history.  SOCIAL HISTORY: Social History   Socioeconomic History   Marital status: Married    Spouse name: Not on file   Number of children: Not on file   Years of education: Not on file   Highest education level: Not on file  Occupational History   Not on file  Tobacco Use   Smoking status: Never   Smokeless tobacco: Never  Vaping Use   Vaping Use: Never used  Substance and Sexual Activity   Alcohol use: No   Drug use: No   Sexual activity: Not on file  Other Topics Concern   Not on file  Social History Narrative   Not on file   Social Determinants of Health   Financial Resource Strain: Not on file  Food Insecurity: Not on file  Transportation Needs: Not on file  Physical Activity: Not on file  Stress: Not on file  Social Connections: Not on file  Intimate Partner Violence: Not on file    PHYSICAL EXAM  GENERAL EXAM/CONSTITUTIONAL: Vitals:  Vitals:   09/27/21 0926  BP: (!) 152/55  Pulse: 66  Weight: 121 lb (54.9 kg)  Height: 5\' 2"  (1.575 m)   Body mass index is 22.13 kg/m. Wt Readings from Last 3  Encounters:  09/27/21 121 lb (54.9 kg)  09/03/21 121 lb (54.9 kg)  03/25/21 127 lb 3.3 oz (57.7 kg)   Patient is in no distress; well developed, nourished and groomed; neck is supple  CARDIOVASCULAR: Examination of carotid arteries is normal; no carotid bruits Regular rate and rhythm, no murmurs Examination of peripheral vascular system by observation and palpation is normal  EYES: Pupils round and reactive to light, Visual fields full to confrontation, Extraocular movements intacts,   MUSCULOSKELETAL: Gait, strength, tone, movements noted in Neurologic  exam below  NEUROLOGIC: MENTAL STATUS:  No flowsheet data found. awake, alert, oriented to person, place and time recent and remote memory intact normal attention and concentration language fluent, comprehension intact, naming intact fund of knowledge appropriate  CRANIAL NERVE:  2nd, 3rd, 4th, 6th - pupils equal and reactive to light, visual fields full to confrontation, extraocular muscles intact, no nystagmus 5th - facial sensation symmetric 7th - facial strength symmetric 8th - hearing intact 9th - palate elevates symmetrically, uvula midline 11th - shoulder shrug symmetric 12th - tongue protrusion midline  MOTOR:  normal bulk and tone, full strength in the BUE, BLE. 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   SENSORY:  normal and symmetric to light touch  COORDINATION:  finger-nose-finger, fine finger movements normal  REFLEXES:  deep tendon reflexes present and symmetric  GAIT/STATION:  normal    DIAGNOSTIC DATA (LABS, IMAGING, TESTING) - I reviewed patient records, labs, notes, testing and imaging myself where available.  Lab Results  Component Value Date   WBC 5.4 09/04/2021   HGB 11.6 (L) 09/04/2021   HCT 34.0 (L) 09/04/2021   MCV 98.6 09/04/2021   PLT 202 09/04/2021      Component Value Date/Time   NA 133 (L) 09/04/2021 0523   K 4.0 09/04/2021 0523   CL 105  09/04/2021 0523   CO2 21 (L) 09/04/2021 0523   GLUCOSE 91 09/04/2021 0523   BUN 15 09/04/2021 0523   CREATININE 0.75 09/04/2021 0523   CALCIUM 8.9 09/04/2021 0523   PROT 6.5 09/04/2021 0523   ALBUMIN 4.3 09/04/2021 0523   AST 25 09/04/2021 0523   ALT 23 09/04/2021 0523   ALKPHOS 60 09/04/2021 0523   BILITOT 0.5 09/04/2021 0523   GFRNONAA >60 09/04/2021 0523   GFRAA >60 11/18/2019 1202   Lab Results  Component Value Date   CHOL 145 09/04/2021   HDL 53 09/04/2021   LDLCALC 70 09/04/2021   TRIG 110 09/04/2021   CHOLHDL 2.7 09/04/2021   Lab Results  Component Value Date   HGBA1C 5.4 09/03/2021   No results found for: VITAMINB12 No results found for: TSH  MRI Brain 09/03/2021 No acute infarction, hemorrhage, or mass. Moderate to marked chronic microvascular ischemic changes.   CTA Head and Neck 09/03/2021 No large vessel occlusion, hemodynamically significant stenosis, or evidence of dissection   ASSESSMENT AND PLAN  81 y.o. year old female with vascular risk factors including hypertension and hyperlipidemia, who is presenting after a TIA.  Patient had less than 1 hour episode of gibberish speech and confusion, could not remember her on cell phone number or her husband phone number.  Reported speech was back to normal in less than 1 hour.  Her TIA work-up showed a MRI with marked chronic microvascular ischemic changes, there was no large vessel occlusion, A1c and lipid were within normal limits.  She was discharged on DAPT for 21 days, she completed it and currently on Plavix daily.  She is on 3 antihypertensive medications, but has not followed up with a the hypertensive clinic yet.  At this time, I will continue patient current medications, advised her to follow-up at the hypertensive clinic and to return in 1 year.  All other questions answered.   1. TIA (transient ischemic attack)   2. Uncontrolled hypertension      Patient Instructions  Discontinue Aspirin  Continue  with Plavix  Continue with your other medications  Follow up at the hypertension clinic  Return in 1  year   No orders of the defined types were placed in this encounter.   Meds ordered this encounter  Medications   hydrALAZINE (APRESOLINE) 10 MG tablet    Sig: Take 1 tablet (10 mg total) by mouth 3 (three) times daily.    Dispense:  90 tablet    Refill:  2   clopidogrel (PLAVIX) 75 MG tablet    Sig: Take 1 tablet (75 mg total) by mouth daily.    Dispense:  30 tablet    Refill:  11    Return in about 1 year (around 09/28/2022).  I have spent a total of 45 minutes dedicated to this patient today, preparing to see patient, performing a medically appropriate examination and evaluation, ordering tests and/or medications and procedures, and counseling and educating the patient/family/caregiver; independently interpreting result and communicating results to the family/patient/caregiver; and documenting clinical information in the electronic medical record.   Alric Ran, MD 09/27/2021, 10:04 AM  [facility] [address], [place], [place] [address] [phone]

## 2021-09-27 NOTE — Patient Instructions (Signed)
Discontinue Aspirin  ?Continue with Plavix  ?Continue with your other medications  ?Follow up at the hypertension clinic  ?Return in 1 year  ?

## 2021-10-04 DIAGNOSIS — N183 Chronic kidney disease, stage 3 unspecified: Secondary | ICD-10-CM | POA: Diagnosis not present

## 2021-10-04 DIAGNOSIS — I10 Essential (primary) hypertension: Secondary | ICD-10-CM | POA: Diagnosis not present

## 2021-10-04 DIAGNOSIS — M810 Age-related osteoporosis without current pathological fracture: Secondary | ICD-10-CM | POA: Diagnosis not present

## 2021-10-04 DIAGNOSIS — Z6821 Body mass index (BMI) 21.0-21.9, adult: Secondary | ICD-10-CM | POA: Diagnosis not present

## 2021-10-04 DIAGNOSIS — G459 Transient cerebral ischemic attack, unspecified: Secondary | ICD-10-CM | POA: Diagnosis not present

## 2021-10-04 DIAGNOSIS — E871 Hypo-osmolality and hyponatremia: Secondary | ICD-10-CM | POA: Diagnosis not present

## 2021-10-19 ENCOUNTER — Inpatient Hospital Stay (HOSPITAL_COMMUNITY)
Admission: EM | Admit: 2021-10-19 | Discharge: 2021-10-23 | DRG: 522 | Disposition: A | Discharge status: Home Health | Payer: Medicare Other | Attending: Internal Medicine | Admitting: Internal Medicine

## 2021-10-19 ENCOUNTER — Emergency Department (HOSPITAL_COMMUNITY): Payer: Medicare Other

## 2021-10-19 ENCOUNTER — Encounter (HOSPITAL_COMMUNITY): Payer: Self-pay

## 2021-10-19 ENCOUNTER — Other Ambulatory Visit: Payer: Self-pay

## 2021-10-19 DIAGNOSIS — H409 Unspecified glaucoma: Secondary | ICD-10-CM | POA: Diagnosis not present

## 2021-10-19 DIAGNOSIS — S72002A Fracture of unspecified part of neck of left femur, initial encounter for closed fracture: Secondary | ICD-10-CM | POA: Diagnosis present

## 2021-10-19 DIAGNOSIS — Z888 Allergy status to other drugs, medicaments and biological substances status: Secondary | ICD-10-CM

## 2021-10-19 DIAGNOSIS — H35371 Puckering of macula, right eye: Secondary | ICD-10-CM | POA: Diagnosis present

## 2021-10-19 DIAGNOSIS — E039 Hypothyroidism, unspecified: Secondary | ICD-10-CM | POA: Diagnosis present

## 2021-10-19 DIAGNOSIS — Y92019 Unspecified place in single-family (private) house as the place of occurrence of the external cause: Secondary | ICD-10-CM

## 2021-10-19 DIAGNOSIS — M25552 Pain in left hip: Secondary | ICD-10-CM | POA: Diagnosis not present

## 2021-10-19 DIAGNOSIS — S00212A Abrasion of left eyelid and periocular area, initial encounter: Secondary | ICD-10-CM | POA: Diagnosis present

## 2021-10-19 DIAGNOSIS — I10 Essential (primary) hypertension: Secondary | ICD-10-CM

## 2021-10-19 DIAGNOSIS — G459 Transient cerebral ischemic attack, unspecified: Secondary | ICD-10-CM | POA: Diagnosis not present

## 2021-10-19 DIAGNOSIS — Z96611 Presence of right artificial shoulder joint: Secondary | ICD-10-CM | POA: Diagnosis present

## 2021-10-19 DIAGNOSIS — Z96642 Presence of left artificial hip joint: Secondary | ICD-10-CM | POA: Diagnosis not present

## 2021-10-19 DIAGNOSIS — M19042 Primary osteoarthritis, left hand: Secondary | ICD-10-CM | POA: Diagnosis not present

## 2021-10-19 DIAGNOSIS — Z882 Allergy status to sulfonamides status: Secondary | ICD-10-CM | POA: Diagnosis not present

## 2021-10-19 DIAGNOSIS — R6889 Other general symptoms and signs: Secondary | ICD-10-CM | POA: Diagnosis not present

## 2021-10-19 DIAGNOSIS — Z79890 Hormone replacement therapy (postmenopausal): Secondary | ICD-10-CM

## 2021-10-19 DIAGNOSIS — Z7902 Long term (current) use of antithrombotics/antiplatelets: Secondary | ICD-10-CM | POA: Diagnosis not present

## 2021-10-19 DIAGNOSIS — D62 Acute posthemorrhagic anemia: Secondary | ICD-10-CM | POA: Diagnosis not present

## 2021-10-19 DIAGNOSIS — S61217A Laceration without foreign body of left little finger without damage to nail, initial encounter: Secondary | ICD-10-CM | POA: Diagnosis not present

## 2021-10-19 DIAGNOSIS — M810 Age-related osteoporosis without current pathological fracture: Secondary | ICD-10-CM | POA: Diagnosis present

## 2021-10-19 DIAGNOSIS — W19XXXA Unspecified fall, initial encounter: Secondary | ICD-10-CM

## 2021-10-19 DIAGNOSIS — S60012A Contusion of left thumb without damage to nail, initial encounter: Secondary | ICD-10-CM | POA: Diagnosis present

## 2021-10-19 DIAGNOSIS — Z8673 Personal history of transient ischemic attack (TIA), and cerebral infarction without residual deficits: Secondary | ICD-10-CM

## 2021-10-19 DIAGNOSIS — Z96651 Presence of right artificial knee joint: Secondary | ICD-10-CM | POA: Diagnosis not present

## 2021-10-19 DIAGNOSIS — Z79899 Other long term (current) drug therapy: Secondary | ICD-10-CM

## 2021-10-19 DIAGNOSIS — Z881 Allergy status to other antibiotic agents status: Secondary | ICD-10-CM | POA: Diagnosis not present

## 2021-10-19 DIAGNOSIS — G709 Myoneural disorder, unspecified: Secondary | ICD-10-CM | POA: Diagnosis not present

## 2021-10-19 DIAGNOSIS — M80052A Age-related osteoporosis with current pathological fracture, left femur, initial encounter for fracture: Secondary | ICD-10-CM | POA: Diagnosis not present

## 2021-10-19 DIAGNOSIS — Z471 Aftercare following joint replacement surgery: Secondary | ICD-10-CM | POA: Diagnosis not present

## 2021-10-19 DIAGNOSIS — W010XXA Fall on same level from slipping, tripping and stumbling without subsequent striking against object, initial encounter: Secondary | ICD-10-CM | POA: Diagnosis present

## 2021-10-19 DIAGNOSIS — M8000XA Age-related osteoporosis with current pathological fracture, unspecified site, initial encounter for fracture: Secondary | ICD-10-CM | POA: Diagnosis not present

## 2021-10-19 DIAGNOSIS — S7002XA Contusion of left hip, initial encounter: Secondary | ICD-10-CM | POA: Diagnosis not present

## 2021-10-19 DIAGNOSIS — E876 Hypokalemia: Secondary | ICD-10-CM | POA: Diagnosis not present

## 2021-10-19 DIAGNOSIS — Z791 Long term (current) use of non-steroidal anti-inflammatories (NSAID): Secondary | ICD-10-CM

## 2021-10-19 DIAGNOSIS — K219 Gastro-esophageal reflux disease without esophagitis: Secondary | ICD-10-CM | POA: Diagnosis not present

## 2021-10-19 DIAGNOSIS — Z743 Need for continuous supervision: Secondary | ICD-10-CM | POA: Diagnosis not present

## 2021-10-19 DIAGNOSIS — S72009B Fracture of unspecified part of neck of unspecified femur, initial encounter for open fracture type I or II: Secondary | ICD-10-CM | POA: Diagnosis not present

## 2021-10-19 DIAGNOSIS — I951 Orthostatic hypotension: Secondary | ICD-10-CM | POA: Diagnosis not present

## 2021-10-19 DIAGNOSIS — E7800 Pure hypercholesterolemia, unspecified: Secondary | ICD-10-CM | POA: Diagnosis present

## 2021-10-19 DIAGNOSIS — S72012A Unspecified intracapsular fracture of left femur, initial encounter for closed fracture: Secondary | ICD-10-CM | POA: Diagnosis not present

## 2021-10-19 LAB — COMPREHENSIVE METABOLIC PANEL
ALT: 31 U/L (ref 0–44)
AST: 32 U/L (ref 15–41)
Albumin: 4.6 g/dL (ref 3.5–5.0)
Alkaline Phosphatase: 60 U/L (ref 38–126)
Anion gap: 9 (ref 5–15)
BUN: 18 mg/dL (ref 8–23)
CO2: 20 mmol/L — ABNORMAL LOW (ref 22–32)
Calcium: 9 mg/dL (ref 8.9–10.3)
Chloride: 101 mmol/L (ref 98–111)
Creatinine, Ser: 0.83 mg/dL (ref 0.44–1.00)
GFR, Estimated: >60 mL/min (ref >60)
Glucose, Bld: 102 mg/dL — ABNORMAL HIGH (ref 70–99)
Potassium: 3.6 mmol/L (ref 3.5–5.1)
Sodium: 130 mmol/L — ABNORMAL LOW (ref 135–145)
Total Bilirubin: 0.6 mg/dL (ref 0.3–1.2)
Total Protein: 7.1 g/dL (ref 6.5–8.1)

## 2021-10-19 LAB — CBC WITH DIFFERENTIAL/PLATELET
Abs Immature Granulocytes: 0.18 10*3/uL — ABNORMAL HIGH (ref 0.00–0.07)
Basophils Absolute: 0 10*3/uL (ref 0.0–0.1)
Basophils Relative: 0 %
Eosinophils Absolute: 0 10*3/uL (ref 0.0–0.5)
Eosinophils Relative: 0 %
HCT: 31.9 % — ABNORMAL LOW (ref 36.0–46.0)
Hemoglobin: 11.2 g/dL — ABNORMAL LOW (ref 12.0–15.0)
Immature Granulocytes: 1 %
Lymphocytes Relative: 6 %
Lymphs Abs: 0.8 10*3/uL (ref 0.7–4.0)
MCH: 33.9 pg (ref 26.0–34.0)
MCHC: 35.1 g/dL (ref 30.0–36.0)
MCV: 96.7 fL (ref 80.0–100.0)
Monocytes Absolute: 0.9 10*3/uL (ref 0.1–1.0)
Monocytes Relative: 6 %
Neutro Abs: 13.1 10*3/uL — ABNORMAL HIGH (ref 1.7–7.7)
Neutrophils Relative %: 87 %
Platelets: 216 10*3/uL (ref 150–400)
RBC: 3.3 MIL/uL — ABNORMAL LOW (ref 3.87–5.11)
RDW: 12.6 % (ref 11.5–15.5)
WBC: 15.1 10*3/uL — ABNORMAL HIGH (ref 4.0–10.5)
nRBC: 0 % (ref 0.0–0.2)

## 2021-10-19 LAB — ABO/RH: ABO/RH(D): A POS

## 2021-10-19 LAB — TYPE AND SCREEN
ABO/RH(D): A POS
Antibody Screen: NEGATIVE

## 2021-10-19 MED ORDER — FENTANYL CITRATE PF 50 MCG/ML IJ SOSY
50.0000 ug | PREFILLED_SYRINGE | Freq: Once | INTRAMUSCULAR | Status: AC
  Administered 2021-10-19: 50 ug via INTRAVENOUS
  Filled 2021-10-19: qty 1

## 2021-10-19 MED ORDER — AMLODIPINE BESYLATE 5 MG PO TABS
5.0000 mg | ORAL_TABLET | Freq: Every day | ORAL | Status: DC
  Administered 2021-10-21 – 2021-10-23 (×2): 5 mg via ORAL
  Filled 2021-10-19 (×2): qty 1

## 2021-10-19 MED ORDER — FENTANYL CITRATE PF 50 MCG/ML IJ SOSY: 12.5000 ug | PREFILLED_SYRINGE | INTRAMUSCULAR | Status: DC | PRN

## 2021-10-19 MED ORDER — DORZOLAMIDE HCL 2 % OP SOLN
1.0000 [drp] | Freq: Three times a day (TID) | OPHTHALMIC | Status: DC
  Administered 2021-10-20 – 2021-10-22 (×7): 1 [drp] via OPHTHALMIC

## 2021-10-19 MED ORDER — FENTANYL CITRATE PF 50 MCG/ML IJ SOSY
12.5000 ug | PREFILLED_SYRINGE | Freq: Once | INTRAMUSCULAR | Status: AC
  Administered 2021-10-19: 12.5 ug via INTRAVENOUS
  Filled 2021-10-19: qty 1

## 2021-10-19 MED ORDER — SODIUM CHLORIDE 0.9 % IV SOLN: INTRAVENOUS | Status: DC

## 2021-10-19 MED ORDER — LOSARTAN POTASSIUM 50 MG PO TABS
100.0000 mg | ORAL_TABLET | Freq: Every day | ORAL | Status: DC
  Administered 2021-10-21: 100 mg via ORAL
  Filled 2021-10-19: qty 2

## 2021-10-19 MED ORDER — OXYCODONE HCL 5 MG PO TABS
5.0000 mg | ORAL_TABLET | ORAL | Status: DC | PRN
  Administered 2021-10-20 (×2): 5 mg via ORAL
  Filled 2021-10-19 (×2): qty 1

## 2021-10-19 MED ORDER — SIMVASTATIN 20 MG PO TABS
20.0000 mg | ORAL_TABLET | Freq: Every evening | ORAL | Status: DC
  Administered 2021-10-20 – 2021-10-23 (×4): 20 mg via ORAL
  Filled 2021-10-19 (×4): qty 1

## 2021-10-19 MED ORDER — HYDRALAZINE HCL 10 MG PO TABS
10.0000 mg | ORAL_TABLET | Freq: Three times a day (TID) | ORAL | Status: DC
  Administered 2021-10-20 – 2021-10-22 (×6): 10 mg via ORAL
  Filled 2021-10-19 (×11): qty 1

## 2021-10-19 MED ORDER — LATANOPROST 0.005 % OP SOLN
1.0000 [drp] | Freq: Every day | OPHTHALMIC | Status: DC
  Administered 2021-10-21 – 2021-10-22 (×2): 1 [drp] via OPHTHALMIC

## 2021-10-19 MED ORDER — ONDANSETRON HCL 4 MG/2ML IJ SOLN
4.0000 mg | Freq: Once | INTRAMUSCULAR | Status: AC
  Administered 2021-10-19: 4 mg via INTRAVENOUS
  Filled 2021-10-19: qty 2

## 2021-10-19 MED ORDER — LEVOTHYROXINE SODIUM 50 MCG PO TABS
50.0000 ug | ORAL_TABLET | Freq: Every day | ORAL | Status: DC
  Administered 2021-10-20 – 2021-10-23 (×4): 50 ug via ORAL
  Filled 2021-10-19 (×4): qty 1

## 2021-10-19 MED ORDER — PANTOPRAZOLE SODIUM 40 MG PO TBEC
40.0000 mg | DELAYED_RELEASE_TABLET | Freq: Every day | ORAL | Status: DC
  Administered 2021-10-21 – 2021-10-23 (×3): 40 mg via ORAL
  Filled 2021-10-19 (×3): qty 1

## 2021-10-19 MED ORDER — FAMOTIDINE 20 MG PO TABS
20.0000 mg | ORAL_TABLET | Freq: Two times a day (BID) | ORAL | Status: DC
  Administered 2021-10-20 – 2021-10-23 (×7): 20 mg via ORAL
  Filled 2021-10-19 (×7): qty 1

## 2021-10-19 NOTE — ED Notes (Signed)
MD notified that pt did in fact hit her head during fall resulting in small lac on Left lateral eye brow. MD also made aware that pt is on Plavix. Zero noted neuro symptoms at present.  ?

## 2021-10-19 NOTE — H&P (Signed)
?History and Physical  ? ? ?Patient: Rebekah King DOB: [birth year] ?DOA: 10/19/2021 ?DOS: the patient was seen and examined on 10/19/2021 ?PCP: Manon Hilding, MD  ?Patient coming from: Home ? ?Chief Complaint:  ?Chief Complaint  ?Patient presents with  ? Fall  ? ?HPI: Rebekah King is a 81 y.o. female with medical history significant of hypothyroidism, glaucoma, GERD, history of TIAs.  Patient was recently put on Plavix because of a TIA.  Patient is brought to the emergency department due to left hip pain after a fall at home.  Patient was exiting her home and the door caught her heel and she fell on the carport.  She struck her left hip and immediately had pain with difficulty ambulating.  EMS was called and brought her here for further evaluation.  X-rays show left hip comminuted fracture Ortho was consulted who would like her admitted at [facility].  Patient is relatively pain-free.  She denies chest pain, difficulty breathing, abdominal pain, head pain, headache. ? ?Review of Systems: As mentioned in the history of present illness. All other systems reviewed and are negative. ?Past Medical History:  ?Diagnosis Date  ? Arthritis   ? Complication of anesthesia   ? CLAUSTROPHOBIC- TROUBLE WITH ANYTHING OVER FACE  ? GERD (gastroesophageal reflux disease)   ? Glaucoma   ? Hypothyroidism   ? UTI (urinary tract infection)   ? ?Past Surgical History:  ?Procedure Laterality Date  ? ABDOMINAL HYSTERECTOMY    ? CATARACT EXTRACTION W/ INTRAOCULAR LENS  IMPLANT, BILATERAL    ? HERNIA REPAIR    ? HIP FRACTURE SURGERY    ?  RIGHT    (2ND SURGERY TO REMOVE PINS)  ? REFRACTIVE SURGERY    ? RETINAL DETACHMENT SURGERY    ? RIGHT  ? 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 Procedure: TOTAL KNEE ARTHROPLASTY;  Surgeon: Earlie Server, MD;  Location: [facility];  Service: Orthopedics;  Laterality: Right;  ? TOTAL SHOULDER ARTHROPLASTY Right 11/25/2019  ? Procedure: TOTAL SHOULDER ARTHROPLASTY;  Surgeon:  Justice Britain, MD;  Location: [facility];  Service: Orthopedics;  Laterality: Right;  137mn  ? ?Social History:  reports that she has never smoked. She has never used smokeless tobacco. She reports that she does not drink alcohol and does not use drugs. ? ?Allergies  ?Allergen Reactions  ? Brimonidine Other (See Comments)  ?  Severe dry mouth  ? Timolol Other (See Comments)  ?  Lightheaded  ? Misc. Sulfonamide Containing Compounds   ? Amoxicillin-Pot Clavulanate Diarrhea  ?  GI pain ? ?  ? Sulfa Antibiotics Nausea Only and Rash  ?  Hands go numb  ? ? ?History reviewed. No pertinent family history. ? ?Prior to Admission medications   ?Medication Sig Start Date End Date Taking? Authorizing Provider  ?amLODipine (NORVASC) 5 MG tablet Take 1 tablet (5 mg total) by mouth daily. 09/05/21   Johnson, Clanford L, MD  ?Ascorbic Acid (VITAMIN C WITH ROSE HIPS) 500 MG tablet Take 500 mg by mouth daily.    [provider]  ?Calcium Carb-Cholecalciferol (CALCIUM 600+D3 PO) Take 1 tablet by mouth in the morning and at bedtime.    [provider]  ?clopidogrel (PLAVIX) 75 MG tablet Take 1 tablet (75 mg total) by mouth daily. 09/27/21   CAlric Ran MD  ?dicyclomine (BENTYL) 10 MG capsule Take 10 mg by mouth 4 (four) times daily. 02/28/21   [provider]  ?dorzolamide (TRUSOPT) 2 % ophthalmic solution  Place 1 drop into both eyes 3 (three) times daily.  08/27/19   [provider]  ?famotidine (PEPCID) 20 MG tablet Take 20 mg by mouth 2 (two) times daily. 08/27/19   [provider]  ?hydrALAZINE (APRESOLINE) 10 MG tablet Take 1 tablet (10 mg total) by mouth 3 (three) times daily. 09/27/21 09/27/22  Alric Ran, MD  ?latanoprost (XALATAN) 0.005 % ophthalmic solution Place 1 drop into both eyes at bedtime.    [provider]  ?levothyroxine (SYNTHROID, LEVOTHROID) 50 MCG tablet Take 50 mcg by mouth daily before breakfast.    [provider]  ?losartan (COZAAR) 100 MG tablet Take  100 mg by mouth daily. 06/08/21   [provider]  ?meloxicam (MOBIC) 15 MG tablet Take 1 tablet (15 mg total) by mouth daily as needed for pain. 09/04/21   Murlean Iba, MD  ?Multiple Vitamin (MULTIVITAMIN WITH MINERALS) TABS tablet Take 1 tablet by mouth daily.    [provider]  ?nitrofurantoin (MACRODANTIN) 50 MG capsule Take 1 capsule (50 mg total) by mouth at bedtime. Take 1 capsule (50 mg total) by mouth at bedtime. 06/27/21   McKenzie, Candee Furbish, MD  ?pantoprazole (PROTONIX) 40 MG tablet Take 40 mg by mouth daily. 03/02/21   [provider]  ?simvastatin (ZOCOR) 20 MG tablet Take 20 mg by mouth every evening.    [provider]  ? ? ?Physical Exam: ?Vitals:  ? 10/19/21 1627 10/19/21 1810  ?BP: (!) 200/68 (!) 164/77  ?Pulse: 71 90  ?Resp: 18 18  ?Temp: 97.8 ?F (36.6 ?C) 98.2 ?F (36.8 ?C)  ?TempSrc: Oral   ?SpO2: 97% 93%  ?Weight: 53.5 kg   ?Height: '5\' 2"'$  (1.575 m)   ? ?General: Elderly female. Awake and alert and oriented x3. No acute cardiopulmonary distress.  ?HEENT: Normocephalic.  Patient has a slight abrasion on the face next to the left eye.  There is mild bruising to that area, but no frank deformity..  Right and left ears normal in appearance.  Pupils equal, round, reactive to light. Extraocular muscles are intact. Sclerae anicteric and noninjected.  Moist mucosal membranes. No mucosal lesions.  ?Neck: Neck supple without lymphadenopathy. No carotid bruits. No masses palpated.  ?Cardiovascular: Regular rate with normal S1-S2 sounds. No murmurs, rubs, gallops auscultated. No JVD.  ?Respiratory: Good respiratory effort with no wheezes, rales, rhonchi. Lungs clear to auscultation bilaterally.  No accessory muscle use. ?Abdomen: Soft, nontender, nondistended. Active bowel sounds. No masses or hepatosplenomegaly  ?Skin: No rashes, lesions, or ulcerations.  Dry, warm to touch. 2+ dorsalis pedis and radial pulses. ?Musculoskeletal: Left shortened leg.  Good ROM.  No  contractures  ?Psychiatric: Intact judgment and insight. Pleasant and cooperative. ?Neurologic: No focal neurological deficits. Strength is 5/5 and symmetric in upper and lower extremities.  Cranial nerves II through XII are grossly intact. ? ?Data Reviewed: ?Results for orders placed or performed during the hospital encounter of 10/19/21 (from the past 24 hour(s))  ?ABO/Rh     Status: None  ? Collection Time: 10/19/21  4:23 PM  ?Result Value Ref Range  ? ABO/RH(D)    ?  A POS ?Performed at [facility], [address], [place], [place] [address] ?  ?Comprehensive metabolic panel     Status: Abnormal  ? Collection Time: 10/19/21  5:45 PM  ?Result Value Ref Range  ? Sodium 130 (L) 135 - 145 mmol/L  ? Potassium 3.6 3.5 - 5.1 mmol/L  ? Chloride 101 98 - 111 mmol/L  ?  CO2 20 (L) 22 - 32 mmol/L  ? Glucose, Bld 102 (H) 70 - 99 mg/dL  ? BUN 18 8 - 23 mg/dL  ? Creatinine, Ser 0.83 0.44 - 1.00 mg/dL  ? Calcium 9.0 8.9 - 10.3 mg/dL  ? Total Protein 7.1 6.5 - 8.1 g/dL  ? Albumin 4.6 3.5 - 5.0 g/dL  ? AST 32 15 - 41 U/L  ? ALT 31 0 - 44 U/L  ? Alkaline Phosphatase 60 38 - 126 U/L  ? Total Bilirubin 0.6 0.3 - 1.2 mg/dL  ? GFR, Estimated >60 >60 mL/min  ? Anion gap 9 5 - 15  ?CBC WITH DIFFERENTIAL     Status: Abnormal  ? Collection Time: 10/19/21  5:45 PM  ?Result Value Ref Range  ? WBC 15.1 (H) 4.0 - 10.5 K/uL  ? RBC 3.30 (L) 3.87 - 5.11 MIL/uL  ? Hemoglobin 11.2 (L) 12.0 - 15.0 g/dL  ? HCT 31.9 (L) 36.0 - 46.0 %  ? MCV 96.7 80.0 - 100.0 fL  ? MCH 33.9 26.0 - 34.0 pg  ? MCHC 35.1 30.0 - 36.0 g/dL  ? RDW 12.6 11.5 - 15.5 %  ? Platelets 216 150 - 400 K/uL  ? nRBC 0.0 0.0 - 0.2 %  ? Neutrophils Relative % 87 %  ? Neutro Abs 13.1 (H) 1.7 - 7.7 K/uL  ? Lymphocytes Relative 6 %  ? Lymphs Abs 0.8 0.7 - 4.0 K/uL  ? Monocytes Relative 6 %  ? Monocytes Absolute 0.9 0.1 - 1.0 K/uL  ? Eosinophils Relative 0 %  ? Eosinophils Absolute 0.0 0.0 - 0.5 K/uL  ? Basophils Relative 0 %  ? Basophils Absolute 0.0 0.0 - 0.1 K/uL  ? Immature Granulocytes  1 %  ? Abs Immature Granulocytes 0.18 (H) 0.00 - 0.07 K/uL  ?Type and screen [facility]     Status: None  ? Collection Time: 10/19/21  5:45 PM  ?Result Value Ref Range  ? ABO/RH(D) A POS   ? Antibody

## 2021-10-19 NOTE — ED Provider Notes (Signed)
?[facility] ?Provider Note ? ? ?CSN: [id number] ?Arrival date & time: 10/19/21  1623 ? ?  ? ?History ? ?Chief Complaint  ?Patient presents with  ? Fall  ? ? ?Rebekah King is a 81 y.o. female. ? ?HPI ?She presents for evaluation of left hip pain which occurred today when she fell as she was going on a door.  She recalls it being windy and feels like she misstepped and could not catch herself so went to the floor and injured her left hip.  Her husband was able to get her up considering a rolling chair, and ultimately brought her here, by private vehicle.  She injured her left pinky finger and thumb in the fall.  She has no other complaints and denies hitting her head or injuring her neck.  She does not take anticoagulants. ?  ? ?Home Medications ?Prior to Admission medications   ?Medication Sig Start Date End Date Taking? Authorizing Provider  ?amLODipine (NORVASC) 5 MG tablet Take 1 tablet (5 mg total) by mouth daily. 09/05/21   Johnson, Clanford L, MD  ?Ascorbic Acid (VITAMIN C WITH ROSE HIPS) 500 MG tablet Take 500 mg by mouth daily.    [provider]  ?Calcium Carb-Cholecalciferol (CALCIUM 600+D3 PO) Take 1 tablet by mouth in the morning and at bedtime.    [provider]  ?clopidogrel (PLAVIX) 75 MG tablet Take 1 tablet (75 mg total) by mouth daily. 09/27/21   Alric Ran, MD  ?dicyclomine (BENTYL) 10 MG capsule Take 10 mg by mouth 4 (four) times daily. 02/28/21   [provider]  ?dorzolamide (TRUSOPT) 2 % ophthalmic solution Place 1 drop into both eyes 3 (three) times daily.  08/27/19   [provider]  ?famotidine (PEPCID) 20 MG tablet Take 20 mg by mouth 2 (two) times daily. 08/27/19   [provider]  ?hydrALAZINE (APRESOLINE) 10 MG tablet Take 1 tablet (10 mg total) by mouth 3 (three) times daily. 09/27/21 09/27/22  Alric Ran, MD  ?latanoprost (XALATAN) 0.005 % ophthalmic solution Place 1 drop into both eyes at bedtime.    [provider]  ?levothyroxine (SYNTHROID, LEVOTHROID) 50 MCG tablet Take 50 mcg by mouth daily before breakfast.    [provider]  ?losartan (COZAAR) 100 MG tablet Take 100 mg by mouth daily. 06/08/21   [provider]  ?meloxicam (MOBIC) 15 MG tablet Take 1 tablet (15 mg total) by mouth daily as needed for pain. 09/04/21   Murlean Iba, MD  ?Multiple Vitamin (MULTIVITAMIN WITH MINERALS) TABS tablet Take 1 tablet by mouth daily.    [provider]  ?nitrofurantoin (MACRODANTIN) 50 MG capsule Take 1 capsule (50 mg total) by mouth at bedtime. Take 1 capsule (50 mg total) by mouth at bedtime. 06/27/21   McKenzie, Candee Furbish, MD  ?pantoprazole (PROTONIX) 40 MG tablet Take 40 mg by mouth daily. 03/02/21   [provider]  ?simvastatin (ZOCOR) 20 MG tablet Take 20 mg by mouth every evening.    [provider]  ?   ? ?Allergies    ?Brimonidine, Timolol, Misc. sulfonamide containing compounds, Amoxicillin-pot clavulanate, and Sulfa antibiotics   ? ?Review of Systems   ?Review of Systems ? ?Physical Exam ?Updated Vital Signs ?BP (!) 164/77   Pulse 90   Temp 98.2 ?F (36.8 ?C)   Resp 18   Ht '5\' 2"'$  (1.575 m)   Wt 53.5 kg   SpO2 93%   BMI 21.58 kg/m?  ?Physical  Exam ?Vitals and nursing note reviewed.  ?Constitutional:   ?   General: She is not in acute distress. ?   Appearance: She is well-developed. She is not ill-appearing, toxic-appearing or diaphoretic.  ?HENT:  ?   Head: Normocephalic and atraumatic.  ?Eyes:  ?   Conjunctiva/sclera: Conjunctivae normal.  ?   Pupils: Pupils are equal, round, and reactive to light.  ?Neck:  ?   Trachea: Phonation normal.  ?Cardiovascular:  ?   Rate and Rhythm: Normal rate and regular rhythm.  ?Pulmonary:  ?   Effort: Pulmonary effort is normal.  ?   Breath sounds: Normal breath sounds.  ?Chest:  ?   Chest wall: No tenderness.  ?Abdominal:  ?   General: There is no distension.  ?   Palpations: Abdomen is soft.  ?   Tenderness: There is  no abdominal tenderness. There is no guarding.  ?Musculoskeletal:     ?   General: Tenderness and signs of injury present. No deformity. Normal range of motion.  ?   Cervical back: Normal range of motion and neck supple.  ?   Comments: Tender left hip with light palpation or motion passively.  Neurovascular intact distally in the left foot.  Left thumb has chronic deformity consistent with arthritis, similar to the right.  Left proximal thumb is mildly tender to palpation.  Left fifth finger has a superficial laceration just below the distal nail, not currently bleeding.  Normal range of motion left finger 3  ?Skin: ?   General: Skin is warm and dry.  ?Neurological:  ?   Mental Status: She is alert and oriented to person, place, and time.  ?   Motor: No abnormal muscle tone.  ?Psychiatric:     ?   Mood and Affect: Mood normal.     ?   Behavior: Behavior normal.     ?   Thought Content: Thought content normal.     ?   Judgment: Judgment normal.  ? ? ?ED Results / Procedures / Treatments   ?Labs ?(all labs ordered are listed, but only abnormal results are displayed) ?Labs Reviewed  ?CBC WITH DIFFERENTIAL/PLATELET - Abnormal; Notable for the following components:  ?    Result Value  ? WBC 15.1 (*)   ? RBC 3.30 (*)   ? Hemoglobin 11.2 (*)   ? HCT 31.9 (*)   ? Neutro Abs 13.1 (*)   ? Abs Immature Granulocytes 0.18 (*)   ? All other components within normal limits  ?COMPREHENSIVE METABOLIC PANEL  ?TYPE AND SCREEN  ? ? ?EKG ?None ? ?Radiology ?DG Hip Unilat With Pelvis 2-3 Views Left ? ?Result Date: 10/19/2021 ?CLINICAL DATA:  Pain, inability to walk in a 81 year old female. Fall, landing on concrete floor. EXAM: DG HIP (WITH OR WITHOUT PELVIS) 2-3V LEFT COMPARISON:  None FINDINGS: Marked osteopenia. Pin tracts in the RIGHT proximal femur with some deformity of the RIGHT proximal femur presumably from prior ORIF of previous fracture. Mildly displaced LEFT subcapital femoral neck fracture. Cross-table lateral with mild  anterior displacement, impaction and apex anterior angulation. No acute fracture of the bony pelvis. IMPRESSION: Mildly displaced, angulated, impacted LEFT subcapital femoral neck fracture. Signs of prior ORIF of the RIGHT proximal femur with mild deformity also on this side, findings suggest previous fracture in this location, correlate with any pain in this area. Electronically Signed   By: Zetta Bills M.D.   On: 10/19/2021 16:57   ? ?Procedures ?Procedures  ? ? ?Medications Ordered  in ED ?Medications  ?0.9 %  sodium chloride infusion (has no administration in time range)  ?fentaNYL (SUBLIMAZE) injection 12.5 mcg (has no administration in time range)  ?ondansetron (ZOFRAN) injection 4 mg (has no administration in time range)  ? ? ?ED Course/ Medical Decision Making/ A&P ?  ?                        ?Medical Decision Making ?Patient presenting with pain left hip after fall, described as mechanical.  No preceding symptoms and no other significant injuries.  Multiple prior orthopedic injuries. ? ?Problems Addressed: ?Closed fracture of left hip, initial encounter [facility]): acute illness or injury ?   Details: Following mechanical fall ?Contusion of left thumb without damage to nail, initial encounter: acute illness or injury ?   Details: Minor, unlikely to be fractured ?Laceration of left little finger without foreign body without damage to nail, initial encounter: acute illness or injury ?   Details: Superficial laceration, tip of fifth finger, no active bleeding or gaping.  Does not require a closure procedure ? ?Amount and/or Complexity of Data Reviewed ?Independent Historian:  ?   Details: She is a cogent historian ?Labs: ordered. ?   Details: CBC, metabolic panel, blood type ?Radiology: ordered and independent interpretation performed. ?   Details: X-ray pelvis and left hip-nondisplaced left hip subcapital fracture ?Discussion of management or test interpretation with external provider(s): Case discussed with Dr.  Mardelle Matte, on-call for Dr. [name].  He accepts patient as a Optometrist with anticipated operative management tomorrow. ? ?Consult hospitalist for admission.  Anticipate transfer to [facility] pending available bed.

## 2021-10-19 NOTE — Progress Notes (Signed)
Spoke with Dr. Eulis Foster.  Patient of Dr. Alvester Morin, acute broken left hip.  Plan for transfer to [facility], admission per [facility], and possible surgery tomorrow with Dr. [name].  Full consult to follow.   ? ?Johnny Bridge, MD ? ?

## 2021-10-19 NOTE — ED Notes (Signed)
Pt was informed about the use of a purewik, pt states she would rather use a bed pan ?

## 2021-10-19 NOTE — ED Triage Notes (Addendum)
Reports "I think my hip is broke".  Walked out her back door and last her balance landing on concrete car port.  L hip pain. Patient is on plavix and has small lac to left eyebrow ?

## 2021-10-20 ENCOUNTER — Inpatient Hospital Stay (HOSPITAL_COMMUNITY): Payer: Medicare Other | Admitting: Certified Registered Nurse Anesthetist

## 2021-10-20 ENCOUNTER — Other Ambulatory Visit: Payer: Self-pay

## 2021-10-20 ENCOUNTER — Inpatient Hospital Stay (HOSPITAL_COMMUNITY): Payer: Medicare Other

## 2021-10-20 ENCOUNTER — Encounter (HOSPITAL_COMMUNITY)
Admission: EM | Disposition: A | Discharge status: Home Health | Payer: Self-pay | Source: Home / Self Care | Attending: Internal Medicine

## 2021-10-20 ENCOUNTER — Encounter (HOSPITAL_COMMUNITY): Payer: Self-pay | Admitting: Family Medicine

## 2021-10-20 DIAGNOSIS — I10 Essential (primary) hypertension: Secondary | ICD-10-CM

## 2021-10-20 DIAGNOSIS — S72002A Fracture of unspecified part of neck of left femur, initial encounter for closed fracture: Secondary | ICD-10-CM

## 2021-10-20 DIAGNOSIS — G709 Myoneural disorder, unspecified: Secondary | ICD-10-CM

## 2021-10-20 DIAGNOSIS — E876 Hypokalemia: Secondary | ICD-10-CM

## 2021-10-20 DIAGNOSIS — E039 Hypothyroidism, unspecified: Secondary | ICD-10-CM

## 2021-10-20 HISTORY — PX: HIP ARTHROPLASTY: SHX981

## 2021-10-20 LAB — SURGICAL PCR SCREEN
MRSA, PCR: NEGATIVE
Staphylococcus aureus: NEGATIVE

## 2021-10-20 LAB — CBC
HCT: 28.8 % — ABNORMAL LOW (ref 36.0–46.0)
Hemoglobin: 10.2 g/dL — ABNORMAL LOW (ref 12.0–15.0)
MCH: 33.8 pg (ref 26.0–34.0)
MCHC: 35.4 g/dL (ref 30.0–36.0)
MCV: 95.4 fL (ref 80.0–100.0)
Platelets: 212 10*3/uL (ref 150–400)
RBC: 3.02 MIL/uL — ABNORMAL LOW (ref 3.87–5.11)
RDW: 12.5 % (ref 11.5–15.5)
WBC: 7.7 10*3/uL (ref 4.0–10.5)
nRBC: 0 % (ref 0.0–0.2)

## 2021-10-20 LAB — BASIC METABOLIC PANEL
Anion gap: 8 (ref 5–15)
BUN: 14 mg/dL (ref 8–23)
CO2: 19 mmol/L — ABNORMAL LOW (ref 22–32)
Calcium: 8.6 mg/dL — ABNORMAL LOW (ref 8.9–10.3)
Chloride: 106 mmol/L (ref 98–111)
Creatinine, Ser: 0.71 mg/dL (ref 0.44–1.00)
GFR, Estimated: >60 mL/min (ref >60)
Glucose, Bld: 155 mg/dL — ABNORMAL HIGH (ref 70–99)
Potassium: 3.1 mmol/L — ABNORMAL LOW (ref 3.5–5.1)
Sodium: 133 mmol/L — ABNORMAL LOW (ref 135–145)

## 2021-10-20 SURGERY — HEMIARTHROPLASTY, HIP, DIRECT ANTERIOR APPROACH, FOR FRACTURE
Anesthesia: General | Site: Hip | Laterality: Left

## 2021-10-20 MED ORDER — DROPERIDOL 2.5 MG/ML IJ SOLN: 0.6250 mg | Freq: Once | INTRAMUSCULAR | Status: DC | PRN

## 2021-10-20 MED ORDER — POTASSIUM CHLORIDE 20 MEQ PO PACK
20.0000 meq | PACK | Freq: Once | ORAL | Status: AC
  Administered 2021-10-20: 20 meq via ORAL
  Filled 2021-10-20: qty 1

## 2021-10-20 MED ORDER — FLEET ENEMA 7-19 GM/118ML RE ENEM: 1.0000 | ENEMA | Freq: Once | RECTAL | Status: DC | PRN

## 2021-10-20 MED ORDER — ACETAMINOPHEN 500 MG PO TABS
1000.0000 mg | ORAL_TABLET | Freq: Once | ORAL | Status: AC
  Administered 2021-10-20: 1000 mg via ORAL

## 2021-10-20 MED ORDER — ONDANSETRON HCL 4 MG/2ML IJ SOLN
INTRAMUSCULAR | Status: DC | PRN
  Administered 2021-10-20: 4 mg via INTRAVENOUS

## 2021-10-20 MED ORDER — PHENYLEPHRINE HCL-NACL 20-0.9 MG/250ML-% IV SOLN
INTRAVENOUS | Status: AC
  Filled 2021-10-20: qty 250

## 2021-10-20 MED ORDER — PROPOFOL 10 MG/ML IV BOLUS
INTRAVENOUS | Status: DC | PRN
  Administered 2021-10-20: 80 mg via INTRAVENOUS

## 2021-10-20 MED ORDER — LIDOCAINE 2% (20 MG/ML) 5 ML SYRINGE
INTRAMUSCULAR | Status: DC | PRN
  Administered 2021-10-20: 50 mg via INTRAVENOUS

## 2021-10-20 MED ORDER — ACETAMINOPHEN 325 MG PO TABS
325.0000 mg | ORAL_TABLET | Freq: Four times a day (QID) | ORAL | Status: DC | PRN
  Administered 2021-10-22 – 2021-10-23 (×6): 650 mg via ORAL
  Filled 2021-10-20 (×6): qty 2

## 2021-10-20 MED ORDER — DOCUSATE SODIUM 100 MG PO CAPS
100.0000 mg | ORAL_CAPSULE | Freq: Two times a day (BID) | ORAL | Status: DC
  Administered 2021-10-20 – 2021-10-23 (×5): 100 mg via ORAL
  Filled 2021-10-20 (×6): qty 1

## 2021-10-20 MED ORDER — CEFAZOLIN SODIUM-DEXTROSE 2-4 GM/100ML-% IV SOLN
2.0000 g | Freq: Four times a day (QID) | INTRAVENOUS | Status: AC
  Administered 2021-10-20 (×2): 2 g via INTRAVENOUS
  Filled 2021-10-20 (×2): qty 100

## 2021-10-20 MED ORDER — DEXAMETHASONE SODIUM PHOSPHATE 10 MG/ML IJ SOLN
INTRAMUSCULAR | Status: AC
  Filled 2021-10-20: qty 1

## 2021-10-20 MED ORDER — ONDANSETRON HCL 4 MG PO TABS: 4.0000 mg | ORAL_TABLET | Freq: Four times a day (QID) | ORAL | Status: DC | PRN

## 2021-10-20 MED ORDER — POTASSIUM CHLORIDE 10 MEQ/100ML IV SOLN
10.0000 meq | INTRAVENOUS | Status: DC
  Administered 2021-10-20: 10 meq via INTRAVENOUS
  Filled 2021-10-20 (×2): qty 100

## 2021-10-20 MED ORDER — CLOPIDOGREL BISULFATE 75 MG PO TABS
75.0000 mg | ORAL_TABLET | Freq: Every day | ORAL | Status: DC
  Administered 2021-10-21 – 2021-10-23 (×3): 75 mg via ORAL
  Filled 2021-10-20 (×3): qty 1

## 2021-10-20 MED ORDER — BISACODYL 10 MG RE SUPP: 10.0000 mg | Freq: Every day | RECTAL | Status: DC | PRN

## 2021-10-20 MED ORDER — STERILE WATER FOR IRRIGATION IR SOLN
Status: DC | PRN
  Administered 2021-10-20: 2000 mL

## 2021-10-20 MED ORDER — PHENYLEPHRINE 40 MCG/ML (10ML) SYRINGE FOR IV PUSH (FOR BLOOD PRESSURE SUPPORT)
PREFILLED_SYRINGE | INTRAVENOUS | Status: AC
  Filled 2021-10-20: qty 10

## 2021-10-20 MED ORDER — SODIUM CHLORIDE (PF) 0.9 % IJ SOLN
INTRAMUSCULAR | Status: AC
  Filled 2021-10-20: qty 10

## 2021-10-20 MED ORDER — HYDROCODONE-ACETAMINOPHEN 5-325 MG PO TABS
1.0000 | ORAL_TABLET | ORAL | Status: DC | PRN
  Administered 2021-10-20 – 2021-10-21 (×4): 1 via ORAL
  Filled 2021-10-20 (×4): qty 1

## 2021-10-20 MED ORDER — ROCURONIUM BROMIDE 10 MG/ML (PF) SYRINGE
PREFILLED_SYRINGE | INTRAVENOUS | Status: DC | PRN
  Administered 2021-10-20: 50 mg via INTRAVENOUS

## 2021-10-20 MED ORDER — POLYETHYLENE GLYCOL 3350 17 G PO PACK: 17.0000 g | PACK | Freq: Every day | ORAL | Status: DC | PRN

## 2021-10-20 MED ORDER — ONDANSETRON HCL 4 MG/2ML IJ SOLN
INTRAMUSCULAR | Status: AC
  Filled 2021-10-20: qty 2

## 2021-10-20 MED ORDER — FENTANYL CITRATE (PF) 250 MCG/5ML IJ SOLN
INTRAMUSCULAR | Status: DC | PRN
  Administered 2021-10-20: 100 ug via INTRAVENOUS

## 2021-10-20 MED ORDER — CEFAZOLIN SODIUM-DEXTROSE 2-4 GM/100ML-% IV SOLN
INTRAVENOUS | Status: AC
  Filled 2021-10-20: qty 100

## 2021-10-20 MED ORDER — BUPIVACAINE HCL (PF) 0.5 % IJ SOLN
INTRAMUSCULAR | Status: DC | PRN
Start: 2021-10-20 — End: 2021-10-20
  Administered 2021-10-20: 30 mL

## 2021-10-20 MED ORDER — HYDROMORPHONE HCL 1 MG/ML IJ SOLN
INTRAMUSCULAR | Status: AC
  Filled 2021-10-20: qty 1

## 2021-10-20 MED ORDER — SALINE SPRAY 0.65 % NA SOLN
1.0000 | NASAL | Status: DC | PRN
  Filled 2021-10-20: qty 44

## 2021-10-20 MED ORDER — TRANEXAMIC ACID-NACL 1000-0.7 MG/100ML-% IV SOLN
1000.0000 mg | INTRAVENOUS | Status: AC
  Administered 2021-10-20: 1000 mg via INTRAVENOUS
  Filled 2021-10-20: qty 100

## 2021-10-20 MED ORDER — POTASSIUM CHLORIDE IN NACL 20-0.45 MEQ/L-% IV SOLN
INTRAVENOUS | Status: DC
  Filled 2021-10-20: qty 1000

## 2021-10-20 MED ORDER — 0.9 % SODIUM CHLORIDE (POUR BTL) OPTIME
TOPICAL | Status: DC | PRN
  Administered 2021-10-20: 1000 mL

## 2021-10-20 MED ORDER — CEFAZOLIN SODIUM-DEXTROSE 2-4 GM/100ML-% IV SOLN
2.0000 g | INTRAVENOUS | Status: AC
  Administered 2021-10-20: 2 g via INTRAVENOUS
  Filled 2021-10-20: qty 100

## 2021-10-20 MED ORDER — ENSURE PRE-SURGERY PO LIQD
296.0000 mL | Freq: Once | ORAL | Status: AC
  Administered 2021-10-20: 296 mL via ORAL
  Filled 2021-10-20: qty 296

## 2021-10-20 MED ORDER — ONDANSETRON HCL 4 MG/2ML IJ SOLN
4.0000 mg | Freq: Four times a day (QID) | INTRAMUSCULAR | Status: DC | PRN
  Administered 2021-10-21: 4 mg via INTRAVENOUS
  Filled 2021-10-20: qty 2

## 2021-10-20 MED ORDER — MORPHINE SULFATE (PF) 2 MG/ML IV SOLN: 0.5000 mg | INTRAVENOUS | Status: DC | PRN

## 2021-10-20 MED ORDER — POVIDONE-IODINE 10 % EX SWAB: 2.0000 "application " | Freq: Once | CUTANEOUS | Status: DC

## 2021-10-20 MED ORDER — ACETAMINOPHEN 500 MG PO TABS
500.0000 mg | ORAL_TABLET | Freq: Four times a day (QID) | ORAL | Status: AC
  Administered 2021-10-20 – 2021-10-21 (×3): 500 mg via ORAL
  Filled 2021-10-20 (×3): qty 1

## 2021-10-20 MED ORDER — HYDROMORPHONE HCL 1 MG/ML IJ SOLN
0.2500 mg | INTRAMUSCULAR | Status: DC | PRN
  Administered 2021-10-20: 0.5 mg via INTRAVENOUS

## 2021-10-20 MED ORDER — SENNA 8.6 MG PO TABS
1.0000 | ORAL_TABLET | Freq: Two times a day (BID) | ORAL | Status: DC
  Administered 2021-10-20 – 2021-10-23 (×3): 8.6 mg via ORAL
  Filled 2021-10-20 (×4): qty 1

## 2021-10-20 MED ORDER — PHENOL 1.4 % MT LIQD: 1.0000 | OROMUCOSAL | Status: DC | PRN

## 2021-10-20 MED ORDER — CHLORHEXIDINE GLUCONATE 4 % EX LIQD
60.0000 mL | Freq: Once | CUTANEOUS | Status: AC
  Administered 2021-10-20: 4 via TOPICAL

## 2021-10-20 MED ORDER — ACETAMINOPHEN 500 MG PO TABS
ORAL_TABLET | ORAL | Status: AC
Start: 2021-10-20 — End: 2021-10-20
  Filled 2021-10-20: qty 2

## 2021-10-20 MED ORDER — BUPIVACAINE HCL (PF) 0.5 % IJ SOLN
INTRAMUSCULAR | Status: AC
  Filled 2021-10-20: qty 30

## 2021-10-20 MED ORDER — MENTHOL 3 MG MT LOZG: 1.0000 | LOZENGE | OROMUCOSAL | Status: DC | PRN

## 2021-10-20 MED ORDER — ALUM & MAG HYDROXIDE-SIMETH 200-200-20 MG/5ML PO SUSP: 30.0000 mL | ORAL | Status: DC | PRN

## 2021-10-20 MED ORDER — DEXAMETHASONE SODIUM PHOSPHATE 10 MG/ML IJ SOLN
INTRAMUSCULAR | Status: DC | PRN
  Administered 2021-10-20: 5 mg via INTRAVENOUS

## 2021-10-20 MED ORDER — FERROUS SULFATE 325 (65 FE) MG PO TABS
325.0000 mg | ORAL_TABLET | Freq: Three times a day (TID) | ORAL | Status: DC
  Administered 2021-10-20 – 2021-10-23 (×9): 325 mg via ORAL
  Filled 2021-10-20 (×9): qty 1

## 2021-10-20 MED ORDER — SUGAMMADEX SODIUM 200 MG/2ML IV SOLN
INTRAVENOUS | Status: DC | PRN
  Administered 2021-10-20: 150 mg via INTRAVENOUS

## 2021-10-20 MED ORDER — LACTATED RINGERS IV SOLN: INTRAVENOUS | Status: DC | PRN

## 2021-10-20 MED ORDER — PROPOFOL 10 MG/ML IV BOLUS
INTRAVENOUS | Status: AC
  Filled 2021-10-20: qty 20

## 2021-10-20 MED ORDER — FENTANYL CITRATE (PF) 250 MCG/5ML IJ SOLN
INTRAMUSCULAR | Status: AC
  Filled 2021-10-20: qty 5

## 2021-10-20 MED ORDER — HYDROCODONE-ACETAMINOPHEN 7.5-325 MG PO TABS: 1.0000 | ORAL_TABLET | ORAL | Status: DC | PRN

## 2021-10-20 SURGICAL SUPPLY — 49 items
BAG COUNTER SPONGE SURGICOUNT (BAG) IMPLANT
BAG ZIPLOCK 12X15 (MISCELLANEOUS) ×2 IMPLANT
BLADE SAW SAG 73X25 THK (BLADE) ×1
BLADE SAW SGTL 73X25 THK (BLADE) ×1 IMPLANT
BRUSH FEMORAL CANAL (MISCELLANEOUS) ×2 IMPLANT
COVER SURGICAL LIGHT HANDLE (MISCELLANEOUS) ×2 IMPLANT
DRAPE INCISE IOBAN 66X45 STRL (DRAPES) ×2 IMPLANT
DRAPE ORTHO SPLIT 77X108 STRL (DRAPES) ×4
DRAPE POUCH INSTRU U-SHP 10X18 (DRAPES) ×2 IMPLANT
DRAPE SURG ORHT 6 SPLT 77X108 (DRAPES) ×1 IMPLANT
DRSG EMULSION OIL 3X16 NADH (GAUZE/BANDAGES/DRESSINGS) ×1 IMPLANT
DRSG MEPILEX BORDER 4X12 (GAUZE/BANDAGES/DRESSINGS) ×1 IMPLANT
DRSG PAD ABDOMINAL 8X10 ST (GAUZE/BANDAGES/DRESSINGS) ×1 IMPLANT
ELECT REM PT RETURN 15FT ADLT (MISCELLANEOUS) ×2 IMPLANT
EVACUATOR 1/8 PVC DRAIN (DRAIN) ×1 IMPLANT
FACESHIELD WRAPAROUND (MASK) ×6 IMPLANT
FACESHIELD WRAPAROUND OR TEAM (MASK) ×4 IMPLANT
GAUZE SPONGE 4X4 12PLY STRL (GAUZE/BANDAGES/DRESSINGS) ×2 IMPLANT
GLOVE SURG ENC MOIS LTX SZ7.5 (GLOVE) ×2 IMPLANT
GLOVE SURG ORTHO LTX SZ8 (GLOVE) ×2 IMPLANT
GOWN STRL REUS W/ TWL XL LVL3 (GOWN DISPOSABLE) ×2 IMPLANT
GOWN STRL REUS W/TWL XL LVL3 (GOWN DISPOSABLE) ×6
HANDPIECE INTERPULSE COAX TIP (DISPOSABLE) ×1
HEAD FEM UNIPOLAR 46 OD STRL (Hips) ×1 IMPLANT
IMMOBILIZER KNEE 20 (SOFTGOODS) IMPLANT
IMMOBILIZER KNEE 20 THIGH 36 (SOFTGOODS) IMPLANT
KIT TURNOVER KIT A (KITS) IMPLANT
MANIFOLD NEPTUNE II (INSTRUMENTS) ×2 IMPLANT
NDL MA TROC 1/2 (NEEDLE) ×1 IMPLANT
NEEDLE MA TROC 1/2 (NEEDLE) IMPLANT
PACK TOTAL JOINT (CUSTOM PROCEDURE TRAY) ×2 IMPLANT
PASSER SUT SWANSON 36MM LOOP (INSTRUMENTS) ×2 IMPLANT
PROTECTOR NERVE ULNAR (MISCELLANEOUS) ×2 IMPLANT
SET HNDPC FAN SPRY TIP SCT (DISPOSABLE) ×1 IMPLANT
SPACER FEM TAPERED +0 12/14 (Hips) ×1 IMPLANT
STAPLER VISISTAT 35W (STAPLE) ×1 IMPLANT
STEM FEMORAL SZ 6MM STD ACTIS (Stem) ×1 IMPLANT
STRIP CLOSURE SKIN 1/2X4 (GAUZE/BANDAGES/DRESSINGS) ×3 IMPLANT
SUT ETHIBOND NAB CT1 #1 30IN (SUTURE) ×4 IMPLANT
SUT ETHILON 4 0 PS 2 18 (SUTURE) ×1 IMPLANT
SUT MNCRL AB 4-0 PS2 18 (SUTURE) ×1 IMPLANT
SUT VIC AB 1 CT1 36 (SUTURE) ×7 IMPLANT
SUT VIC AB 2-0 CT1 27 (SUTURE) ×2
SUT VIC AB 2-0 CT1 TAPERPNT 27 (SUTURE) ×4 IMPLANT
SUT VIC AB 3-0 PS2 18 (SUTURE) ×4
SUT VIC AB 3-0 PS2 18XBRD (SUTURE) IMPLANT
TOWEL OR 17X26 10 PK STRL BLUE (TOWEL DISPOSABLE) ×4 IMPLANT
TOWER CARTRIDGE SMART MIX (DISPOSABLE) ×1 IMPLANT
TRAY FOLEY MTR SLVR 16FR STAT (SET/KITS/TRAYS/PACK) ×1 IMPLANT

## 2021-10-20 NOTE — Assessment & Plan Note (Addendum)
-  Patient admitted after mechanical fall found to have left hip fracture. ?Hip/pelvis X-ray: Mildly displaced, angulated, impacted left subcapital femoral neck fracture.  Signs of prior ORIF of the right proximal femoral with mild deformity also on the sides, findings suggest previous fracture in this location. ?-Dr. [name] consulted. ?-Pain management with as needed morphine and Vicodin.  ?-Underwent Left hip unipolar hemiarthroplasty by Dr. [name] on 3/25.  ?-Plavix resume plus aspirin started for DVT prophylaxis.  ?

## 2021-10-20 NOTE — Op Note (Signed)
NAME: Rebekah King, Rebekah C. ?MEDICAL RECORD NO: [id number] ?ACCOUNT NO: [id number] ?DATE OF BIRTH: 04-Sep-1940 ?FACILITY: [facility] ?LOCATION: [facility] ?PHYSICIAN: W D. Valeta Harms., MD ? ?Operative Report  ? ?DATE OF PROCEDURE: 10/19/2021 ? ?PREOPERATIVE DIAGNOSIS:  Displaced left femoral neck fracture. ? ?POSTOPERATIVE DIAGNOSIS:  Displaced left femoral neck fracture. ? ?PROCEDURE:  Left hip unipolar hemiarthroplasty (46 mm diameter head, +0 spacer, 6 standard ACTIS stem). ? ?SURGEON:  W D. Valeta Harms., MD. ? ?ASSISTANT:  Shana Chute, PA. ? ?ANESTHESIA:  General anesthetic. ? ?BLOOD LOSS:  200 mL ? ?DESCRIPTION OF PROCEDURE:  Lateral position with a posterior approach to the hip made.  We split the fascia and exposed the hip joint through a T-capsulotomy revealing a displaced rather high subcapital hip fracture.  We made a provisional neck cut  ?before removing the head, which was sized to be a size 46.  There was no degenerative change in the acetabulum.  We then progressively broached with the ACTIS system to accept a #6 standard stem, trialled off the stem with a +0 spacer.  It was noted to  ?be extremely stable, could only be dislocated at greater than 90 degrees flexion, full adduction and internal rotation approaching 90 degrees.  Leg lengths appeared equal.  The wound was copiously irrigated.  The final stem was placed without difficulty  ?and reduced.  The capsulotomy was closed with Ti-Cron.  The fascial tissues with Ti-Cron as well and the subcutaneous tissues with Vicryl and Monocryl in the skin.  Taken to recovery room in stable condition. ? ? ?PUS ?D: 10/20/2021 10:42:36 am T: 10/20/2021 12:46:00 pm  ?JOB: [id number]/ [id number]  ?

## 2021-10-20 NOTE — Plan of Care (Signed)
?  Problem: Clinical Measurements: ?Goal: Diagnostic test results will improve ?Outcome: Progressing ?  ?Problem: Clinical Measurements: ?Goal: Respiratory complications will improve ?Outcome: Progressing ?  ?Problem: Nutrition: ?Goal: Adequate nutrition will be maintained ?Outcome: Progressing ?  ?Problem: Pain Managment: ?Goal: General experience of comfort will improve ?Outcome: Progressing ?  ?

## 2021-10-20 NOTE — Plan of Care (Signed)
?  Problem: Clinical Measurements: ?Goal: Respiratory complications will improve ?10/20/2021 1012 by Elza Rafter, RN ?Outcome: Progressing ?10/20/2021 0738 by Elza Rafter, RN ?Outcome: Progressing ?  ?Problem: Clinical Measurements: ?Goal: Cardiovascular complication will be avoided ?10/20/2021 1012 by Elza Rafter, RN ?Outcome: Progressing ?10/20/2021 0738 by Elza Rafter, RN ?Outcome: Progressing ?  ?Problem: Elimination: ?Goal: Will not experience complications related to bowel motility ?10/20/2021 1012 by Elza Rafter, RN ?Outcome: Progressing ?10/20/2021 0738 by Elza Rafter, RN ?Outcome: Progressing ?  ?Problem: Safety: ?Goal: Ability to remain free from injury will improve ?10/20/2021 1012 by Elza Rafter, RN ?Outcome: Progressing ?10/20/2021 0738 by Elza Rafter, RN ?Outcome: Progressing ?  ?

## 2021-10-20 NOTE — Assessment & Plan Note (Addendum)
Resume  Norvasc,Cozaar. ?Not symptomatic today from orthostatic hypotension.  ?Hold Hydralazine.  ?

## 2021-10-20 NOTE — Consult Note (Addendum)
? ?ORTHOPAEDIC CONSULTATION ? ?REQUESTING PHYSICIAN: King, Rebekah Freer, MD ? ?Chief Complaint: left hip pain ? ?HPI: ?Rebekah King is a 81 y.o. female with history of hypothyroidism, HTN, glaucoma, GERD. She recently had a TIA on Feb 6th and was put on aspirin and Plavix. She has stopped the aspirin and continued the Plavix. Last dose was yesterday morning. Yesterday she was brought to the ED after a fall at home. She was leaving her house and fall in her carport directly onto her left hip. Pain was severe and she was unable to put weight on her hip. She was brought to the ED where she was found to have a left hip fracture. She has a history of a right femoral neck fracture s/p percutaneous pinnning and subsequent removal of pins. Does not walk with an assistive device at baseline. Today she states pain is located at the left hip, it is well controlled at rest, pain worse with movement and better with rest and pain medication. Denies any right hip pain. Denies any chest pain, shortness of breath, nausea, vomiting, or abdominal pain.  ? ?Past Medical History:  ?Diagnosis Date  ? Arthritis   ? Complication of anesthesia   ? CLAUSTROPHOBIC- TROUBLE WITH ANYTHING OVER FACE  ? GERD (gastroesophageal reflux disease)   ? Glaucoma   ? Hypothyroidism   ? UTI (urinary tract infection)   ? ?Past Surgical History:  ?Procedure Laterality Date  ? ABDOMINAL HYSTERECTOMY    ? CATARACT EXTRACTION W/ INTRAOCULAR LENS  IMPLANT, BILATERAL    ? HERNIA REPAIR    ? HIP FRACTURE SURGERY    ?  RIGHT    (2ND SURGERY TO REMOVE PINS)  ? REFRACTIVE SURGERY    ? RETINAL DETACHMENT SURGERY    ? RIGHT  ? 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 Procedure: TOTAL KNEE ARTHROPLASTY;  Surgeon: Earlie Server, MD;  Location: [facility];  Service: Orthopedics;  Laterality: Right;  ? TOTAL SHOULDER ARTHROPLASTY Right 11/25/2019  ? Procedure: TOTAL SHOULDER ARTHROPLASTY;  Surgeon: Justice Britain, MD;  Location: [facility];  Service: Orthopedics;  Laterality:  Right;  163mn  ? ?Social History  ? ?Socioeconomic History  ? Marital status: Married  ?  Spouse name: Not on file  ? Number of children: Not on file  ? Years of education: Not on file  ? Highest education level: Not on file  ?Occupational History  ? Not on file  ?Tobacco Use  ? Smoking status: Never  ? Smokeless tobacco: Never  ?Vaping Use  ? Vaping Use: Never used  ?Substance and Sexual Activity  ? Alcohol use: No  ? Drug use: No  ? Sexual activity: Not on file  ?Other Topics Concern  ? Not on file  ?Social History Narrative  ? Not on file  ? ?Social Determinants of Health  ? ?Financial Resource Strain: Not on file  ?Food Insecurity: Not on file  ?Transportation Needs: Not on file  ?Physical Activity: Not on file  ?Stress: Not on file  ?Social Connections: Not on file  ? ?History reviewed. No pertinent family history. ?Allergies  ?Allergen Reactions  ? Brimonidine Other (See Comments)  ?  Severe dry mouth  ? Timolol Other (See Comments)  ?  Lightheaded  ? Misc. Sulfonamide Containing Compounds   ? Amoxicillin-Pot Clavulanate Diarrhea  ?  GI pain ? ?  ? Sulfa Antibiotics Nausea Only and Rash  ?  Hands go numb  ? ? ? ?Positive ROS: All other systems have been  reviewed and were otherwise negative with the exception of those mentioned in the HPI and as above. ? ?Physical Exam: ?General: Alert, no acute distress ?Cardiovascular: No pedal edema ?Respiratory: No cyanosis, no use of accessory musculature ?GI: No organomegaly, abdomen is soft and non-tender ?Skin: No lesions in the area of chief complaint ?Neurologic: Sensation intact distally ?Psychiatric: Patient is competent for consent with normal mood and affect ?Lymphatic: No axillary or cervical lymphadenopathy ? ?MUSCULOSKELETAL: Dorsiflexion and plantarflexion intact bilaterally. Sensation intact to all aspects of bilateral feet. 2+ DP pulses, feet warm and well perfused. No ttp to lateral left hip. No ecchymosis or edema.  ? ?Imaging: Left hip x-rays left  femoral neck fracture.  ? ?Assessment: ?Closed fracture of left hip  ? ?Plan: ?Discussed case with Dr. Tyrell Antonio and also Dr. Theda Sers with neurology who did not feel we needed to wait further before moving forward with surgery. Patient's last Plavix dose was yesterday morning. Risks, benefits, and alternatives of a left hip hemiarthroplasty were discussed with the patient and she would like to move forward with surgery. I also discussed this with Rebekah King, patient's husband, who expressed understating and consent with plan to move forward with surgery.  ? ?Ventura Bruns, PA-C ? ?10/20/2021 ?7:00 AM ?  ?

## 2021-10-20 NOTE — Progress Notes (Signed)
?  Progress Note ? ? ?Patient: Rebekah King ZOX:[id number] DOB: 1941-05-19 DOA: 10/19/2021     1 ?DOS: the patient was seen and examined on 10/20/2021 ?  ?Brief hospital course: ?81 year old with past medical history significant for hypothyroidism, glaucoma, GERD, history of TIAs recently started on Plavix for TIA was brought to the ED after a mechanical  fall at home and subsequently patient complaining of left hip pain.  Patient was found to have a left hip comminuted fracture by x-ray.  Patient admitted for further evaluation and treatment. ? ?Assessment and Plan: ?* Closed left hip fracture (Rebekah King) ?-Patient admitted after mechanical fall found to have left hip fracture. ?Hip/pelvis X-ray: Mildly displaced, angulated, impacted left subcapital femoral neck fracture.  Signs of prior ORIF of the right proximal femoral with mild deformity also on the sides, findings suggest previous fracture in this location. ?-Dr. French King consulted. ?-Pain management with as needed IV fentanyl ?-Plan for Sx today, patient understand she is at risk for Sx due to her recent TIA, but if she doesn't have Sx she can be bed bound, develops DVT and bedsores.  ? ?Hypokalemia ?Replete IV ? ?Essential hypertension ?Continue with Norvasc, hydralazine and Cozaar ? ?TIA (transient ischemic attack) ?Holding Plavix preop.  Resume when okay by  Ortho.  ?Plan to resume plavix tomorrow.  ? ?Hypothyroidism ?Continue with Synthroid ? ?Hypercholesterolemia ?Continue with Zocor ? ?Osteoporosis ?We will check vitamin D level. ? ?Gastroesophageal reflux disease ?Continue with Pepcid and Protonix ? ? ? ? ?  ? ?Subjective: I saw patient prior to Sx. She denies dyspnea or chest pain on ambulation . She has not been very active die to her chronic back pain. She report hip pain  ?She report nasal congestion.  ? ?Physical Exam: ?Vitals:  ? 10/20/21 1130 10/20/21 1145 10/20/21 1200 10/20/21 1223  ?BP: (!) 148/66 (!) 163/109 (!) 157/78 (!) 147/59  ?Pulse: 89 87 87  89  ?Resp: '17 15 15 18  '$ ?Temp: 98.8 ?F (37.1 ?C)   98.8 ?F (37.1 ?C)  ?TempSrc:    Oral  ?SpO2: 98% 95% 95% 95%  ?Weight:      ?Height:      ? ?Genera; Alert, oriented.  ?CVS; S 1, S 2 RRR ?Lungs; CTA ?Extremities. Left LE shorter.  ?Neuro; non focal.  ? ?Data Reviewed: ? ?Cbc and Bmet reviewed.  ? ?Family Communication: care discussed with patient.  ? ?Disposition: ?Status is: Inpatient ?Remains inpatient appropriate because: needs left hip sx  ? Planned Discharge Destination: Skilled nursing facility ? ? ? ?Time spent: 45 minutes ? ?Author: ?Rebekah Shiley, MD ?10/20/2021 1:11 PM ? ?For on call review [email or web address].  ?

## 2021-10-20 NOTE — Assessment & Plan Note (Addendum)
Replaced. °

## 2021-10-20 NOTE — Assessment & Plan Note (Signed)
Continue with Synthroid 

## 2021-10-20 NOTE — Progress Notes (Signed)
Pt to floor in stable condition from pacu. No needs at time of arrival to floor. Assessment and VS performed. Rn will continue to monitor. ?

## 2021-10-20 NOTE — Assessment & Plan Note (Signed)
Continue with Zocor ?

## 2021-10-20 NOTE — Assessment & Plan Note (Addendum)
Controlled BP ?Plavix resume 3/26 ?

## 2021-10-20 NOTE — Progress Notes (Signed)
Pt down to pacu in stable condition. No needs at time of transfer.  °

## 2021-10-20 NOTE — Assessment & Plan Note (Addendum)
Vitamin D normal at 51 ?

## 2021-10-20 NOTE — Anesthesia Preprocedure Evaluation (Addendum)
Anesthesia Evaluation  ?Patient identified by MRN, date of birth, ID band ?Patient awake ? ? ? ?Reviewed: ?Allergy & Precautions, NPO status , Patient's Chart, lab work & pertinent test results ? ?History of Anesthesia Complications ?(+) history of anesthetic complications ? ?Airway ?Mallampati: I ? ?TM Distance: >3 FB ?Neck ROM: Full ? ? ? Dental ? ?(+) Teeth Intact, Dental Advisory Given ?  ?Pulmonary ?neg pulmonary ROS,  ?  ?breath sounds clear to auscultation ? ? ? ? ? ? Cardiovascular ?hypertension, pulmonary hypertension ?Rhythm:Regular Rate:Normal ? ? ?  ?Neuro/Psych ? Headaches, TIA Neuromuscular disease negative psych ROS  ? GI/Hepatic ?Neg liver ROS, GERD  ,  ?Endo/Other  ?Hypothyroidism  ? Renal/GU ?negative Renal ROS  ? ?  ?Musculoskeletal ? ?(+) Arthritis ,  ? Abdominal ?Normal abdominal exam  (+)   ?Peds ? Hematology ?negative hematology ROS ?(+)   ?Anesthesia Other Findings ? ? Reproductive/Obstetrics ? ?  ? ? ? ? ? ? ? ? ? ? ? ? ? ?  ?  ? ? ? ? ? ? ?Anesthesia Physical ?Anesthesia Plan ? ?ASA: 3 ? ?Anesthesia Plan: General  ? ?Post-op Pain Management: Ofirmev IV (intra-op)*  ? ?Induction: Intravenous ? ?PONV Risk Score and Plan: 4 or greater and Ondansetron, Dexamethasone and Treatment may vary due to age or medical condition ? ?Airway Management Planned: Oral ETT ? ?Additional Equipment:  ? ?Intra-op Plan:  ? ?Post-operative Plan: Extubation in OR ? ?Informed Consent: I have reviewed the patients History and Physical, chart, labs and discussed the procedure including the risks, benefits and alternatives for the proposed anesthesia with the patient or authorized representative who has indicated his/her understanding and acceptance.  ? ? ? ?Dental advisory given ? ?Plan Discussed with: CRNA ? ?Anesthesia Plan Comments: (Last dose of plavix 10/19/2021. Plan GETA)  ? ? ? ? ? ? ?Anesthesia Quick Evaluation ? ?

## 2021-10-20 NOTE — Hospital Course (Addendum)
81 year old with past medical history significant for hypothyroidism, glaucoma, GERD, history of TIAs recently started on Plavix for TIA was brought to the ED after a mechanical  fall at home and subsequently patient complaining of left hip pain.  Patient was found to have a left hip comminuted fracture by x-ray.   ?Underwent left hip unipolar hemiarthroplasty by Dr. [name] on 3/25. ? ?Patient had episode of orthostatic hypotension this am. Improved with IV fluids, Blood transfusion and holding Medications.  ?Patient feels better today.  ? ? ?

## 2021-10-20 NOTE — Plan of Care (Signed)

## 2021-10-20 NOTE — Anesthesia Postprocedure Evaluation (Signed)
Anesthesia Post Note ? ?Patient: Rebekah King ? ?Procedure(s) Performed: ARTHROPLASTY BIPOLAR HIP (HEMIARTHROPLASTY) (Left: Hip) ? ?  ? ?Patient location during evaluation: PACU ?Anesthesia Type: General ?Level of consciousness: sedated and patient cooperative ?Pain management: pain level controlled ?Vital Signs Assessment: post-procedure vital signs reviewed and stable ?Respiratory status: spontaneous breathing ?Cardiovascular status: stable ?Anesthetic complications: no ? ? ?No notable events documented. ? ?Last Vitals:  ?Vitals:  ? 10/20/21 1329 10/20/21 1505  ?BP: (!) 150/58 120/64  ?Pulse: 81 82  ?Resp: 18 18  ?Temp: 37 ?C 37.1 ?C  ?SpO2: 99% 99%  ?  ?Last Pain:  ?Vitals:  ? 10/20/21 1505  ?TempSrc: Oral  ?PainSc:   ? ? ?  ?  ?  ?  ?  ?  ? ?Nolon Nations ? ? ? ? ?

## 2021-10-20 NOTE — Progress Notes (Signed)
Md notified that pt was unable to complete k runs iv due to surgery.  ?

## 2021-10-20 NOTE — Assessment & Plan Note (Addendum)
Continue with Pepcid and Protonix ?

## 2021-10-20 NOTE — Anesthesia Procedure Notes (Signed)
Procedure Name: Intubation ?Date/Time: 10/20/2021 9:27 AM ?Performed by: Talbot Grumbling, CRNA ?Pre-anesthesia Checklist: Patient identified, Emergency Drugs available, Patient being monitored and Suction available ?Patient Re-evaluated:Patient Re-evaluated prior to induction ?Oxygen Delivery Method: Circle system utilized ?Preoxygenation: Pre-oxygenation with 100% oxygen ?Induction Type: IV induction ?Ventilation: Mask ventilation without difficulty ?Laryngoscope Size: Mac and 3 ?Grade View: Grade I ?Tube type: Oral ?Tube size: 7.0 mm ?Number of attempts: 1 ?Airway Equipment and Method: Stylet ?Placement Confirmation: ETT inserted through vocal cords under direct vision, positive ETCO2 and breath sounds checked- equal and bilateral ?Secured at: 21 cm ?Tube secured with: Tape ?Dental Injury: Teeth and Oropharynx as per pre-operative assessment  ? ? ? ? ?

## 2021-10-20 NOTE — Transfer of Care (Signed)
Immediate Anesthesia Transfer of Care Note ? ?Patient: Rebekah King ? ?Procedure(s) Performed: ARTHROPLASTY BIPOLAR HIP (HEMIARTHROPLASTY) (Left: Hip) ? ?Patient Location: PACU ? ?Anesthesia Type:General ? ?Level of Consciousness: sedated ? ?Airway & Oxygen Therapy: Patient Spontanous Breathing and Patient connected to face mask oxygen ? ?Post-op Assessment: Report given to RN and Post -op Vital signs reviewed and stable ? ?Post vital signs: Reviewed and stable ? ?Last Vitals:  ?Vitals Value Taken Time  ?BP    ?Temp    ?Pulse 91 10/20/21 1117  ?Resp 15 10/20/21 1117  ?SpO2 99 % 10/20/21 1117  ?Vitals shown include unvalidated device data. ? ?Last Pain:  ?Vitals:  ? 10/20/21 0734  ?TempSrc:   ?PainSc: 5   ?   ? ?Patients Stated Pain Goal: 2 (10/20/21 0734) ? ?Complications: No notable events documented. ?

## 2021-10-21 DIAGNOSIS — S72002A Fracture of unspecified part of neck of left femur, initial encounter for closed fracture: Secondary | ICD-10-CM | POA: Diagnosis not present

## 2021-10-21 LAB — BASIC METABOLIC PANEL
Anion gap: 8 (ref 5–15)
BUN: 11 mg/dL (ref 8–23)
CO2: 19 mmol/L — ABNORMAL LOW (ref 22–32)
Calcium: 8.1 mg/dL — ABNORMAL LOW (ref 8.9–10.3)
Chloride: 108 mmol/L (ref 98–111)
Creatinine, Ser: 0.64 mg/dL (ref 0.44–1.00)
GFR, Estimated: >60 mL/min (ref >60)
Glucose, Bld: 106 mg/dL — ABNORMAL HIGH (ref 70–99)
Potassium: 3.7 mmol/L (ref 3.5–5.1)
Sodium: 135 mmol/L (ref 135–145)

## 2021-10-21 LAB — CBC
HCT: 24.6 % — ABNORMAL LOW (ref 36.0–46.0)
Hemoglobin: 8.5 g/dL — ABNORMAL LOW (ref 12.0–15.0)
MCH: 33.3 pg (ref 26.0–34.0)
MCHC: 34.6 g/dL (ref 30.0–36.0)
MCV: 96.5 fL (ref 80.0–100.0)
Platelets: 158 10*3/uL (ref 150–400)
RBC: 2.55 MIL/uL — ABNORMAL LOW (ref 3.87–5.11)
RDW: 12.8 % (ref 11.5–15.5)
WBC: 9.4 10*3/uL (ref 4.0–10.5)
nRBC: 0 % (ref 0.0–0.2)

## 2021-10-21 LAB — VITAMIN D 25 HYDROXY (VIT D DEFICIENCY, FRACTURES): Vit D, 25-Hydroxy: 51.62 ng/mL (ref 30–100)

## 2021-10-21 MED ORDER — ASPIRIN 81 MG PO CHEW
81.0000 mg | CHEWABLE_TABLET | Freq: Every day | ORAL | Status: DC
  Administered 2021-10-21 – 2021-10-23 (×3): 81 mg via ORAL
  Filled 2021-10-21 (×2): qty 1

## 2021-10-21 NOTE — Progress Notes (Signed)
Orthopedic pa called concerning weight bearing restrictions and hip precautions. Orders placed based on conversation. Rebekah King also stated that no treatment was needed for old fractures that showed up on right hip x ray results.  ?

## 2021-10-21 NOTE — Plan of Care (Signed)
  Problem: Education: Goal: Knowledge of General Education information will improve Description: Including pain rating scale, medication(s)/side effects and non-pharmacologic comfort measures Outcome: Progressing   Problem: Coping: Goal: Level of anxiety will decrease Outcome: Progressing   Problem: Pain Managment: Goal: General experience of comfort will improve Outcome: Progressing   

## 2021-10-21 NOTE — Progress Notes (Signed)
?  Progress Note ? ? ?Patient: Rebekah King WKG:[id number] DOB: Sep 11, 1940 DOA: 10/19/2021     2 ?DOS: the patient was seen and examined on 10/21/2021 ?  ?Brief hospital course: ?81 year old with past medical history significant for hypothyroidism, glaucoma, GERD, history of TIAs recently started on Plavix for TIA was brought to the ED after a mechanical  fall at home and subsequently patient complaining of left hip pain.  Patient was found to have a left hip comminuted fracture by x-ray.   ?Underwent left hip unipolar hemiarthroplasty by Dr. [name] on 3/25 ? ?Assessment and Plan: ?* Closed left hip fracture ([place]) ?-Patient admitted after mechanical fall found to have left hip fracture. ?Hip/pelvis X-ray: Mildly displaced, angulated, impacted left subcapital femoral neck fracture.  Signs of prior ORIF of the right proximal femoral with mild deformity also on the sides, findings suggest previous fracture in this location. ?-Dr. [name] consulted. ?-Pain management with as needed morphine and Vicodin.  ?-Underwent Left hip unipolar hemiarthroplasty by Dr. [name] on 3/25.  ?-Plavix resume plus aspirin started for DVT prophylaxis.  ? ?Hypokalemia ?Replaced.  ? ?Essential hypertension ?Continue with Norvasc, hydralazine and Cozaar. ?Adjust as needed.  ? ?TIA (transient ischemic attack) ?Controlled BP ?Plavix resume 3/26 ? ?Hypothyroidism ?Continue with Synthroid ? ?Hypercholesterolemia ?Continue with Zocor ? ?Osteoporosis ?Vitamin D normal at 51 ? ?Gastroesophageal reflux disease ?Continue with Pepcid and Protonix ? ? ? ? ?  ? ?Subjective: pain is controlled. Had BM. She think she should be able to go home and not to rehab.  ? ? ?Physical Exam: ?Vitals:  ? 10/20/21 2215 10/21/21 0154 10/21/21 0433 10/21/21 1122  ?BP: (!) 152/64 (!) 169/77 (!) 153/72   ?Pulse: 92 99 (!) 102   ?Resp: '16 16 16 18  '$ ?Temp: 98.1 ?F (36.7 ?C) 98.5 ?F (36.9 ?C) 99.6 ?F (37.6 ?C) 98.8 ?F (37.1 ?C)  ?TempSrc: Oral Oral Oral Oral  ?SpO2: 93% 95%  97%   ?Weight:      ?Height:      ? ?General; NAD ?Lungs; CTA ?Abdomen; soft, nt ?Extremity dressing in place.  ? ?Data Reviewed: ? ?Cbc and bmet reviewed.  ? ?Family Communication: Husband at bedside.  ? ?Disposition: ?Status is: Inpatient ?Remains inpatient appropriate because: remain inpatient day 1 post op, PT evaluation.  ? Planned Discharge Destination: Home ? ? ? ?Time spent: 45 minutes ? ?Author: ?Elmarie Shiley, MD ?10/21/2021 12:06 PM ? ?For on call review [email or web address]  ?

## 2021-10-21 NOTE — Evaluation (Signed)
Physical Therapy Evaluation ?Patient Details ?Name: Rebekah King ?MRN: [id number] ?DOB: [birth year] ?Today's Date: 10/21/2021 ? ?History of Present Illness ? Pt admitted from home s/p fall wtih L hip fx and now s/p L hemi-arthoplasty by posterior approach.  Pt with hx of Glaucoma, R TKR R TSR, "mild cognitive impairment" and hearing deficit  ?Clinical Impression ? Pt admitted as above and presenting with functional mobility limitations 2* decreased L LE strength/ROM, post op pain, decreased endurance, nausea with activity and posterior THP.  Pt very motivated and with good family support system and should progress to dc home with 24/7 assist.  Pt would greatly benefit from follow up HHPT to maximize IND and safety at home. ?   ? ?Recommendations for follow up therapy are one component of a multi-disciplinary discharge planning process, led by the attending physician.  Recommendations may be updated based on patient status, additional functional criteria and insurance authorization. ? ?Follow Up Recommendations Home health PT ? ?  ?Assistance Recommended at Discharge Frequent or constant Supervision/Assistance  ?Patient can return home with the following ? A little help with walking and/or transfers;A little help with bathing/dressing/bathroom;Assistance with cooking/housework;Assist for transportation;Help with stairs or ramp for entrance ? ?  ?Equipment Recommendations None recommended by PT  ?Recommendations for Other Services ? OT consult  ?  ?Functional Status Assessment Patient has had a recent decline in their functional status and demonstrates the ability to make significant improvements in function in a reasonable and predictable amount of time.  ? ?  ?Precautions / Restrictions Precautions ?Precautions: Fall;Posterior Hip ?Precaution Booklet Issued: Yes (comment) ?Precaution Comments: All precautions reviewed x 3 ?Restrictions ?Weight Bearing Restrictions: No ?Other Position/Activity Restrictions: WBAT  ? ?   ? ?Mobility ? Bed Mobility ?Overal bed mobility: Needs Assistance ?Bed Mobility: Supine to Sit ?  ?  ?Supine to sit: Min assist, Mod assist ?  ?  ?General bed mobility comments: cues for sequence, use of R LE to self assist and adherence to THP.  Physical assist to manage L LE and to bring trunk to upright ?  ? ?Transfers ?Overall transfer level: Needs assistance ?Equipment used: Rolling walker (2 wheels) ?Transfers: Sit to/from Stand ?Sit to Stand: Min assist, Mod assist ?  ?  ?  ?  ?  ?General transfer comment: cues for LE management, adherence to THP and use of UEs to self assist ?  ? ?Ambulation/Gait ?Ambulation/Gait assistance: Min assist, +2 physical assistance, +2 safety/equipment ?Gait Distance (Feet): 10 Feet ?Assistive device: Rolling walker (2 wheels) ?Gait Pattern/deviations: Step-to pattern, Shuffle, Trunk flexed, Antalgic ?Gait velocity: decr ?  ?  ?General Gait Details: cues for sequence, posture and position from RW ? ?Stairs ?  ?  ?  ?  ?  ? ?Wheelchair Mobility ?  ? ?Modified Rankin (Stroke Patients Only) ?  ? ?  ? ?Balance Overall balance assessment: Needs assistance ?Sitting-balance support: No upper extremity supported, Feet supported ?Sitting balance-Leahy Scale: Fair ?  ?  ?Standing balance support: Bilateral upper extremity supported ?Standing balance-Leahy Scale: Poor ?  ?  ?  ?  ?  ?  ?  ?  ?  ?  ?  ?  ?   ? ? ? ?Pertinent Vitals/Pain Pain Assessment ?Pain Assessment: 0-10 ?Pain Score: 3  ?Pain Location: L hip ?Pain Descriptors / Indicators: Aching, Sore ?Pain Intervention(s): Limited activity within patient's tolerance, Monitored during session, Premedicated before session, Ice applied  ? ? ?Home Living Family/patient expects to be discharged to:: Private residence ?  Living Arrangements: Spouse/significant other ?Available Help at Discharge: Available PRN/intermittently ?Type of Home: House ?Home Access: Stairs to enter ?Entrance Stairs-Rails: None ?Entrance Stairs-Number of Steps:  1 ?Alternate Level Stairs-Number of Steps: 12 ?Home Layout: Laundry or work area in basement;Two level ?Home Equipment: Conservation officer, nature (2 wheels);Cane - single point;BSC/3in1;Grab bars - tub/shower ?   ?  ?Prior Function Prior Level of Function : Needs assist ?  ?  ?  ?  ?  ?  ?Mobility Comments: Pt is a household and short distanced community ambulator without AD. Pt reports her husnband will help her with stairs PRN. ?ADLs Comments: Pt reports independence for ADL's. Pt cooks some and does not drive. Pt still goes to the grocery store with assist. ?  ? ? ?Hand Dominance  ? Dominant Hand: Right ? ?  ?Extremity/Trunk Assessment  ? Upper Extremity Assessment ?Upper Extremity Assessment: Defer to OT evaluation ?  ? ?Lower Extremity Assessment ?Lower Extremity Assessment: LLE deficits/detail ?LLE Deficits / Details: 2+/5 strength at hip with AAROM at hip to 90 flex and 20 abd ?  ? ?   ?Communication  ? Communication: No difficulties  ?Cognition Arousal/Alertness: Awake/alert ?Behavior During Therapy: [facility] for tasks assessed/performed ?Overall Cognitive Status: Within Functional Limits for tasks assessed ?  ?  ?  ?  ?  ?  ?  ?  ?  ?  ?  ?  ?  ?  ?  ?  ?  ?  ?  ? ?  ?General Comments   ? ?  ?Exercises Total Joint Exercises ?Ankle Circles/Pumps: AROM, Both, 15 reps, Supine ?Quad Sets: AROM, Both, 10 reps, Supine ?Heel Slides: AAROM, Left, 15 reps, Supine ?Hip ABduction/ADduction: AAROM, Left, 15 reps, Supine  ? ?Assessment/Plan  ?  ?PT Assessment Patient needs continued PT services  ?PT Problem List Decreased strength;Decreased activity tolerance;Decreased range of motion;Decreased balance;Decreased mobility;Decreased cognition;Decreased knowledge of use of DME;Pain ? ?   ?  ?PT Treatment Interventions DME instruction;Gait training;Stair training;Functional mobility training;Therapeutic activities;Therapeutic exercise;Patient/family education   ? ?PT Goals (Current goals can be found in the Care Plan section)  ?Acute Rehab  PT Goals ?Patient Stated Goal: Regain IND to return home ?PT Goal Formulation: With patient ?Time For Goal Achievement: 10/28/21 ?Potential to Achieve Goals: Good ? ?  ?Frequency Min 6X/week ?  ? ? ?Co-evaluation   ?  ?  ?  ?  ? ? ?  ?AM-PAC PT "6 Clicks" Mobility  ?Outcome Measure Help needed turning from your back to your side while in a flat bed without using bedrails?: A Lot ?Help needed moving from lying on your back to sitting on the side of a flat bed without using bedrails?: A Lot ?Help needed moving to and from a bed to a chair (including a wheelchair)?: A Lot ?Help needed standing up from a chair using your arms (e.g., wheelchair or bedside chair)?: A Lot ?Help needed to walk in hospital room?: Total ?Help needed climbing 3-5 steps with a railing? : Total ?6 Click Score: 10 ? ?  ?End of Session Equipment Utilized During Treatment: Gait belt ?Activity Tolerance: Patient limited by fatigue;Other (comment) (nausea) ?Patient left: in chair;with call bell/phone within reach;with chair alarm set;with family/visitor present ?Nurse Communication: Mobility status ?PT Visit Diagnosis: Unsteadiness on feet (R26.81);Difficulty in walking, not elsewhere classified (R26.2);Pain ?Pain - Right/Left: Left ?Pain - part of body: Hip ?  ? ?Time: 2482-5003 ?PT Time Calculation (min) (ACUTE ONLY): 27 min ? ? ?Charges:   PT Evaluation ?$PT  Eval Low Complexity: 1 Low ?PT Treatments ?$Therapeutic Exercise: 8-22 mins ?  ?   ? ? ?Debe Coder PT ?Acute Rehabilitation Services ?Pager [phone] ?Office [phone] ? ? ?Raeleigh Guinn ?10/21/2021, 2:31 PM ? ?

## 2021-10-21 NOTE — Progress Notes (Signed)
PHARMACY BRIEF MONITORING NOTE ? ?Rebekah King is a 81 year old female who underwent left hip unipolar hemiarthroplasty on 10/20/21.  Notable PMH includes history of TIA on clopidogrel.  Pharmacy is consulted to resume clopidogrel on POD#1, 10/21/21.   ? ?Plan: ?- Resume clopidogrel '75mg'$  PO daily ?- Begin aspirin '81mg'$  daily per ortho ? ?Dimple Nanas, PharmD ?10/21/2021 9:06 AM ? ?

## 2021-10-21 NOTE — Progress Notes (Signed)
Physical Therapy Treatment ?Patient Details ?Name: Rebekah King ?MRN: [id number] ?DOB: 02-13-1941 ?Today's Date: 10/21/2021 ? ? ?History of Present Illness Pt admitted from home s/p fall wtih L hip fx and now s/p L hemi-arthoplasty by posterior approach.  Pt with hx of Glaucoma, R TKR R TSR, "mild cognitive impairment" and hearing deficit ? ?  ?PT Comments  ? ? Pt continues very cooperative/motivated but notably impulsive and requiring frequent cueing for safety and adherence to posterior THP.  This pm, pt up to ambulate limited distance to/from bathroom for toileting - continues to fatigue easily and with c/o nausea with exertion.  Pt's dtr and spouse in room and very supportive.   ?Recommendations for follow up therapy are one component of a multi-disciplinary discharge planning process, led by the attending physician.  Recommendations may be updated based on patient status, additional functional criteria and insurance authorization. ? ?Follow Up Recommendations ? Home health PT ?  ?  ?Assistance Recommended at Discharge Frequent or constant Supervision/Assistance  ?Patient can return home with the following A little help with walking and/or transfers;A little help with bathing/dressing/bathroom;Assistance with cooking/housework;Assist for transportation;Help with stairs or ramp for entrance ?  ?Equipment Recommendations ? None recommended by PT  ?  ?Recommendations for Other Services OT consult ? ? ?  ?Precautions / Restrictions Precautions ?Precautions: Fall;Posterior Hip ?Precaution Booklet Issued: Yes (comment) ?Precaution Comments: Pt recalls 2/3 THR - all precautions reviewed ?Restrictions ?Weight Bearing Restrictions: No ?Other Position/Activity Restrictions: WBAT  ?  ? ?Mobility ? Bed Mobility ?Overal bed mobility: Needs Assistance ?Bed Mobility: Sit to Supine ?  ?  ?Supine to sit: Min assist, Mod assist ?Sit to supine: Min assist, Mod assist ?  ?General bed mobility comments: cues for sequence, use of R  LE to self assist and adherence to THP.  Physical assist to manage LEs ?  ? ?Transfers ?Overall transfer level: Needs assistance ?Equipment used: Rolling walker (2 wheels) ?Transfers: Sit to/from Stand ?Sit to Stand: Min assist, From elevated surface ?  ?  ?  ?  ?  ?General transfer comment: cues for LE management, adherence to THP and use of UEs to self assist ?  ? ?Ambulation/Gait ?Ambulation/Gait assistance: Min assist, +2 safety/equipment ?Gait Distance (Feet): 14 Feet (14' twice - to/from bathroom) ?Assistive device: Rolling walker (2 wheels) ?Gait Pattern/deviations: Step-to pattern, Shuffle, Trunk flexed, Antalgic ?Gait velocity: decr ?  ?  ?General Gait Details: cues for sequence, posture, adherence to THP and position from RW ? ? ?Stairs ?  ?  ?  ?  ?  ? ? ?Wheelchair Mobility ?  ? ?Modified Rankin (Stroke Patients Only) ?  ? ? ?  ?Balance Overall balance assessment: Needs assistance ?Sitting-balance support: No upper extremity supported, Feet supported ?Sitting balance-Leahy Scale: Fair ?  ?  ?Standing balance support: Single extremity supported ?Standing balance-Leahy Scale: Poor ?  ?  ?  ?  ?  ?  ?  ?  ?  ?  ?  ?  ?  ? ?  ?Cognition Arousal/Alertness: Awake/alert ?Behavior During Therapy: [facility] for tasks assessed/performed ?Overall Cognitive Status: Within Functional Limits for tasks assessed ?  ?  ?  ?  ?  ?  ?  ?  ?  ?  ?  ?  ?  ?  ?  ?  ?  ?  ?  ? ?  ?Exercises Total Joint Exercises ?Ankle Circles/Pumps: AROM, Both, 15 reps, Supine ?Quad Sets: AROM, Both, 10 reps, Supine ?Heel Slides: AAROM, Left,  15 reps, Supine ?Hip ABduction/ADduction: AAROM, Left, 15 reps, Supine ? ?  ?General Comments   ?  ?  ? ?Pertinent Vitals/Pain Pain Assessment ?Pain Assessment: 0-10 ?Pain Score: 3  ?Pain Location: L hip ?Pain Descriptors / Indicators: Aching, Sore ?Pain Intervention(s): Limited activity within patient's tolerance, Monitored during session, Premedicated before session  ? ? ?Home Living Family/patient expects  to be discharged to:: Private residence ?Living Arrangements: Spouse/significant other ?Available Help at Discharge: Available PRN/intermittently ?Type of Home: House ?Home Access: Stairs to enter ?Entrance Stairs-Rails: None ?Entrance Stairs-Number of Steps: 1 ?Alternate Level Stairs-Number of Steps: 12 ?Home Layout: Laundry or work area in basement;Two level ?Home Equipment: Conservation officer, nature (2 wheels);Cane - single point;BSC/3in1;Grab bars - tub/shower ?   ?  ?Prior Function    ?  ?  ?   ? ?PT Goals (current goals can now be found in the care plan section) Acute Rehab PT Goals ?Patient Stated Goal: Regain IND to return home ?PT Goal Formulation: With patient ?Time For Goal Achievement: 10/28/21 ?Potential to Achieve Goals: Good ?Progress towards PT goals: Progressing toward goals ? ?  ?Frequency ? ? ? Min 6X/week ? ? ? ?  ?PT Plan Current plan remains appropriate  ? ? ?Co-evaluation PT/OT/SLP Co-Evaluation/Treatment: Yes ?Reason for Co-Treatment: For patient/therapist safety;To address functional/ADL transfers ?PT goals addressed during session: Mobility/safety with mobility ?OT goals addressed during session: ADL's and self-care ?  ? ?  ?AM-PAC PT "6 Clicks" Mobility   ?Outcome Measure ? Help needed turning from your back to your side while in a flat bed without using bedrails?: A Lot ?Help needed moving from lying on your back to sitting on the side of a flat bed without using bedrails?: A Lot ?Help needed moving to and from a bed to a chair (including a wheelchair)?: A Lot ?Help needed standing up from a chair using your arms (e.g., wheelchair or bedside chair)?: A Lot ?Help needed to walk in hospital room?: Total ?Help needed climbing 3-5 steps with a railing? : Total ?6 Click Score: 10 ? ?  ?End of Session Equipment Utilized During Treatment: Gait belt ?Activity Tolerance: Patient limited by fatigue;Other (comment) (nausea) ?Patient left: in bed;with call bell/phone within reach;with bed alarm set;with  family/visitor present ?Nurse Communication: Mobility status ?PT Visit Diagnosis: Unsteadiness on feet (R26.81);Difficulty in walking, not elsewhere classified (R26.2);Pain ?Pain - Right/Left: Left ?Pain - part of body: Hip ?  ? ? ?Time: 9326-7124 ?PT Time Calculation (min) (ACUTE ONLY): 27 min ? ?Charges:  $Gait Training: 8-22 mins ?$Therapeutic Exercise: 8-22 mins          ?          ? ?Rebekah King PT ?Acute Rehabilitation Services ?Pager [phone] ?Office [phone] ? ? ? ?Rebekah King ?10/21/2021, 2:39 PM ? ?

## 2021-10-21 NOTE — Plan of Care (Signed)
?  Problem: Clinical Measurements: ?Goal: Diagnostic test results will improve ?Outcome: Progressing ?  ?Problem: Clinical Measurements: ?Goal: Respiratory complications will improve ?Outcome: Progressing ?  ?Problem: Nutrition: ?Goal: Adequate nutrition will be maintained ?Outcome: Progressing ?  ?Problem: Pain Managment: ?Goal: General experience of comfort will improve ?Outcome: Progressing ?  ?Problem: Safety: ?Goal: Ability to remain free from injury will improve ?Outcome: Progressing ?  ?

## 2021-10-21 NOTE — Evaluation (Signed)
Occupational Therapy Evaluation Patient Details Name: Rebekah King MRN: [id number] DOB: 01-10-1941 Today's Date: 10/21/2021   History of Present Illness Pt admitted from home s/p fall wtih L hip fx and now s/p L hemi-arthoplasty by posterior approach.  Pt with hx of Glaucoma, R TKR R TSR, "mild cognitive impairment" and hearing deficit   Clinical Impression   Mrs. Ronald Lobo admitted to hospital with above medical history and presents with generalized weakness, decreased ROM and strength of LLE, decreased activity tolerance, impaired balance and pain. Patient needing min assist to ambulate with walker and heavy cues for technique and increased assistance with ADLs including max assist for LB ADLs and toileting due to posterior hip precautions. Patient will benefit from skilled OT services while in hospital to improve deficits and learn compensatory strategies as needed in order to return to PLOF.  Plan is to take patient home at discharge.       Recommendations for follow up therapy are one component of a multi-disciplinary discharge planning process, led by the attending physician.  Recommendations may be updated based on patient status, additional functional criteria and insurance authorization.   Follow Up Recommendations  Home health OT    Assistance Recommended at Discharge Frequent or constant Supervision/Assistance  Patient can return home with the following A little help with walking and/or transfers;A little help with bathing/dressing/bathroom;Assistance with cooking/housework;Direct supervision/assist for financial management;Direct supervision/assist for medications management;Help with stairs or ramp for entrance;Assist for transportation    Functional Status Assessment  Patient has had a recent decline in their functional status and demonstrates the ability to make significant improvements in function in a reasonable and predictable amount of time.  Equipment  Recommendations  None recommended by OT    Recommendations for Other Services       Precautions / Restrictions Precautions Precautions: Fall;Posterior Hip Precaution Booklet Issued: Yes (comment) Precaution Comments: Pt recalls 2/3 THR - all precautions reviewed Restrictions Weight Bearing Restrictions: No Other Position/Activity Restrictions: WBAT      Mobility Bed Mobility                    Transfers                          Balance Overall balance assessment: Needs assistance Sitting-balance support: No upper extremity supported, Feet supported Sitting balance-Leahy Scale: Fair     Standing balance support: During functional activity Standing balance-Leahy Scale: Fair                             ADL either performed or assessed with clinical judgement   ADL Overall ADL's : Needs assistance/impaired Eating/Feeding: Independent   Grooming: Standing;Wash/dry hands;Min guard   Upper Body Bathing: Set up;Sitting   Lower Body Bathing: Maximal assistance;Sit to/from stand;Adhering to hip precautions   Upper Body Dressing : Set up;Sitting   Lower Body Dressing: Maximal assistance;Sit to/from stand;Adhering to hip precautions   Toilet Transfer: Min guard;Rolling walker (2 wheels);Comfort height toilet;Adhering to hip precautions;Cueing for safety;Cueing for sequencing   Toileting- Clothing Manipulation and Hygiene: Cueing for safety;Cueing for sequencing;Sit to/from stand       Functional mobility during ADLs: Minimal assistance;Standard walker       Vision Patient Visual Report: No change from baseline       Perception     Praxis      Pertinent Vitals/Pain Pain Assessment Pain Assessment: 0-10 Pain  Score: 3  Pain Location: L hip Pain Descriptors / Indicators: Aching, Sore Pain Intervention(s): Limited activity within patient's tolerance     Hand Dominance Right   Extremity/Trunk Assessment Upper Extremity  Assessment Upper Extremity Assessment: RUE deficits/detail;LUE deficits/detail RUE Deficits / Details: WFL ROM,  3+/5 shoulder strength, 4/5 bicep, 4-/5 tricep, grip functional, severe thumb OA RUE Sensation: WNL LUE Deficits / Details: WFL ROM, grossly 4-/5 strength, bicep 4+/5 strength   Lower Extremity Assessment Lower Extremity Assessment: Defer to PT evaluation LLE Deficits / Details: 2+/5 strength at hip with AAROM at hip to 90 flex and 20 abd   Cervical / Trunk Assessment Cervical / Trunk Assessment: Normal   Communication Communication Communication: No difficulties   Cognition Arousal/Alertness: Awake/alert Behavior During Therapy: WFL for tasks assessed/performed Overall Cognitive Status: Within Functional Limits for tasks assessed                                 General Comments: Hx of memory deficits, also ver [facility]     General Comments       Exercises     Shoulder Instructions      Home Living Family/patient expects to be discharged to:: Private residence Living Arrangements: Spouse/significant other Available Help at Discharge: Available PRN/intermittently Type of Home: House Home Access: Stairs to enter Secretary/administrator of Steps: 1 Entrance Stairs-Rails: None Home Layout: Laundry or work area in basement;Two level Alternate Level Stairs-Number of Steps: 12 Alternate Level Stairs-Rails: Right Bathroom Shower/Tub: Tub/shower unit   Bathroom Toilet: Handicapped height Bathroom Accessibility: Yes   Home Equipment: Agricultural consultant (2 wheels);Cane - single point;BSC/3in1;Grab bars - tub/shower          Prior Functioning/Environment Prior Level of Function : Needs assist             Mobility Comments: Pt is a household and short distanced community ambulator without AD. Pt reports her husnband will help her with stairs PRN. ADLs Comments: Pt reports independence for ADL's. Pt cooks some and does not drive. Pt still goes to the  grocery store with assist.        OT Problem List: Decreased strength;Decreased activity tolerance;Impaired balance (sitting and/or standing);Decreased knowledge of use of DME or AE;Decreased knowledge of precautions;Pain;Impaired UE functional use;Decreased range of motion      OT Treatment/Interventions: Self-care/ADL training;Therapeutic exercise;Patient/family education;Visual/perceptual remediation/compensation;Cognitive remediation/compensation;Therapeutic activities;Balance training;DME and/or AE instruction    OT Goals(Current goals can be found in the care plan section) Acute Rehab OT Goals Patient Stated Goal: be able to go home OT Goal Formulation: With patient Time For Goal Achievement: 11/04/21 Potential to Achieve Goals: Good  OT Frequency: Min 2X/week    Co-evaluation PT/OT/SLP Co-Evaluation/Treatment: Yes Reason for Co-Treatment: For patient/therapist safety;To address functional/ADL transfers PT goals addressed during session: Mobility/safety with mobility OT goals addressed during session: ADL's and self-care      AM-PAC OT "6 Clicks" Daily Activity     Outcome Measure Help from another person eating meals?: None Help from another person taking care of personal grooming?: A Little Help from another person toileting, which includes using toliet, bedpan, or urinal?: A Lot Help from another person bathing (including washing, rinsing, drying)?: A Lot Help from another person to put on and taking off regular upper body clothing?: A Little Help from another person to put on and taking off regular lower body clothing?: A Lot 6 Click Score: 16   End of Session  Equipment Utilized During Treatment: Rolling walker (2 wheels);Gait belt Nurse Communication: Mobility status  Activity Tolerance: Patient tolerated treatment well Patient left: in bed;with call bell/phone within reach;with bed alarm set;with family/visitor present  OT Visit Diagnosis: Other abnormalities of  gait and mobility (R26.89)                Time: 6213-0865 OT Time Calculation (min): 27 min Charges:  OT General Charges $OT Visit: 1 Visit OT Evaluation $OT Eval Low Complexity: 1 Low  Zailen Albarran, OTR/L Acute Care Rehab Services  Office [phone] Pager: [phone]   Kelli Churn 10/21/2021, 2:54 PM

## 2021-10-21 NOTE — Progress Notes (Addendum)
? ? ? ?Subjective: ?1 Day Post-Op s/p Procedure(s): ?ARTHROPLASTY BIPOLAR HIP (HEMIARTHROPLASTY) ? ? Patient is alert, oriented, sitting up in bed. States pain has overall been well controlled. Eager to get home, wondering if she can discharge today. ?Denies chest pain, SOB, Calf pain. No nausea/vomiting. No other complaints. ? ? ?Objective:  ?PE: ?VITALS:   ?Vitals:  ? 10/20/21 1847 10/20/21 2215 10/21/21 0154 10/21/21 0433  ?BP: 135/74 (!) 152/64 (!) 169/77 (!) 153/72  ?Pulse: 87 92 99 (!) 102  ?Resp: '20 16 16 16  '$ ?Temp: 98.1 ?F (36.7 ?C) 98.1 ?F (36.7 ?C) 98.5 ?F (36.9 ?C) 99.6 ?F (37.6 ?C)  ?TempSrc: Oral Oral Oral Oral  ?SpO2: 97% 93% 95% 97%  ?Weight:      ?Height:      ? ? ?ABD soft ?Sensation intact distally ?Intact pulses distally ?Dorsiflexion/Plantar flexion intact ?Incision: dressing C/D/I ?No significant ecchymosis or edema at left hip ? ?LABS ? ?Results for orders placed or performed during the hospital encounter of 10/19/21 (from the past 24 hour(s))  ?CBC     Status: Abnormal  ? Collection Time: 10/21/21  3:08 AM  ?Result Value Ref Range  ? WBC 9.4 4.0 - 10.5 K/uL  ? RBC 2.55 (L) 3.87 - 5.11 MIL/uL  ? Hemoglobin 8.5 (L) 12.0 - 15.0 g/dL  ? HCT 24.6 (L) 36.0 - 46.0 %  ? MCV 96.5 80.0 - 100.0 fL  ? MCH 33.3 26.0 - 34.0 pg  ? MCHC 34.6 30.0 - 36.0 g/dL  ? RDW 12.8 11.5 - 15.5 %  ? Platelets 158 150 - 400 K/uL  ? nRBC 0.0 0.0 - 0.2 %  ?Basic metabolic panel     Status: Abnormal  ? Collection Time: 10/21/21  3:08 AM  ?Result Value Ref Range  ? Sodium 135 135 - 145 mmol/L  ? Potassium 3.7 3.5 - 5.1 mmol/L  ? Chloride 108 98 - 111 mmol/L  ? CO2 19 (L) 22 - 32 mmol/L  ? Glucose, Bld 106 (H) 70 - 99 mg/dL  ? BUN 11 8 - 23 mg/dL  ? Creatinine, Ser 0.64 0.44 - 1.00 mg/dL  ? Calcium 8.1 (L) 8.9 - 10.3 mg/dL  ? GFR, Estimated >60 >60 mL/min  ? Anion gap 8 5 - 15  ? ? ?Pelvis Portable ? ?Result Date: 10/20/2021 ?CLINICAL DATA:  Postop left hip hemiarthroplasty. EXAM: PORTABLE PELVIS 1-2 VIEWS COMPARISON:   10/19/2021.  09/19/2020. FINDINGS: New left proximal femur hemiarthroplasty appears well seated and aligned. No acute fracture or evidence of an operative complication. Parallel linear areas of sclerosis in the proximal right femur consistent with tracks from prior screws, stable. Right hip joint, SI joints and symphysis pubis are normally aligned. Postoperative soft tissue edema and air adjacent to the left hip/proximal femur. IMPRESSION: 1. Well-positioned left hip hemiarthroplasty. 2. No acute fracture or evidence of an operative complication. Electronically Signed   By: Lajean Manes M.D.   On: 10/20/2021 12:19  ? ?DG HIP UNILAT WITH PELVIS 2-3 VIEWS LEFT ? ?Result Date: 10/20/2021 ?CLINICAL DATA:  Left hip hemiarthroplasty. EXAM: DG HIP (WITH OR WITHOUT PELVIS) 2-3V LEFT COMPARISON:  10/19/2021. FINDINGS: Left hip hemiarthroplasty appears well seated and aligned. There are adjacent postoperative soft tissue changes. No acute fracture or evidence of an operative complication. IMPRESSION: Well-positioned left hip hemiarthroplasty. Electronically Signed   By: Lajean Manes M.D.   On: 10/20/2021 12:17  ? ?DG Hip Unilat With Pelvis 2-3 Views Left ? ?Result Date: 10/19/2021 ?CLINICAL DATA:  Pain, inability to walk in a 81 year old female. Fall, landing on concrete floor. EXAM: DG HIP (WITH OR WITHOUT PELVIS) 2-3V LEFT COMPARISON:  None FINDINGS: Marked osteopenia. Pin tracts in the RIGHT proximal femur with some deformity of the RIGHT proximal femur presumably from prior ORIF of previous fracture. Mildly displaced LEFT subcapital femoral neck fracture. Cross-table lateral with mild anterior displacement, impaction and apex anterior angulation. No acute fracture of the bony pelvis. IMPRESSION: Mildly displaced, angulated, impacted LEFT subcapital femoral neck fracture. Signs of prior ORIF of the RIGHT proximal femur with mild deformity also on this side, findings suggest previous fracture in this location, correlate  with any pain in this area. Electronically Signed   By: Zetta Bills M.D.   On: 10/19/2021 16:57   ? ?Assessment/Plan: ?Closed left hip fracture in patient with recent TIA in February on Plavix ? ?1 Day Post-Op s/p Procedure(s): ?ARTHROPLASTY BIPOLAR HIP (HEMIARTHROPLASTY) ? ?Acute Blood Loss Anemia: ?Hbg 8.5, will continue to monitor, not at transfusion level ? ?Weightbearing: WBAT LLE ?Insicional and dressing care: Reinforce dressings as needed ?VTE prophylaxis: Hbg 8.5, will restart Plavix this morning with 81 mg ASA ?Pain control: continue current regimen ?Follow - up plan: 2 weeks with Dr. [name] ?Dispo: pending PT and OT evals. Will plan to keep at least overnight to monitor Hbg with possible discharge tomorrow.  ? ?Contact information:   ?Weekdays [address] [place] [phone] A ?fter hours and holidays please check [email or web address] for group call information for [facility] ? ?Ventura Bruns ?10/21/2021, 7:27 AM  ?

## 2021-10-22 ENCOUNTER — Encounter (HOSPITAL_COMMUNITY): Payer: Self-pay | Admitting: Internal Medicine

## 2021-10-22 ENCOUNTER — Ambulatory Visit: Payer: Medicare Other | Admitting: Urology

## 2021-10-22 DIAGNOSIS — D62 Acute posthemorrhagic anemia: Secondary | ICD-10-CM

## 2021-10-22 DIAGNOSIS — I951 Orthostatic hypotension: Secondary | ICD-10-CM

## 2021-10-22 DIAGNOSIS — N3021 Other chronic cystitis with hematuria: Secondary | ICD-10-CM

## 2021-10-22 DIAGNOSIS — S72002A Fracture of unspecified part of neck of left femur, initial encounter for closed fracture: Secondary | ICD-10-CM | POA: Diagnosis not present

## 2021-10-22 HISTORY — DX: Acute posthemorrhagic anemia: D62

## 2021-10-22 LAB — BASIC METABOLIC PANEL
Anion gap: 6 (ref 5–15)
BUN: 11 mg/dL (ref 8–23)
CO2: 21 mmol/L — ABNORMAL LOW (ref 22–32)
Calcium: 8.1 mg/dL — ABNORMAL LOW (ref 8.9–10.3)
Chloride: 107 mmol/L (ref 98–111)
Creatinine, Ser: 0.57 mg/dL (ref 0.44–1.00)
GFR, Estimated: >60 mL/min (ref >60)
Glucose, Bld: 104 mg/dL — ABNORMAL HIGH (ref 70–99)
Potassium: 3.5 mmol/L (ref 3.5–5.1)
Sodium: 134 mmol/L — ABNORMAL LOW (ref 135–145)

## 2021-10-22 LAB — CBC
HCT: 23.6 % — ABNORMAL LOW (ref 36.0–46.0)
Hemoglobin: 8.2 g/dL — ABNORMAL LOW (ref 12.0–15.0)
MCH: 33.7 pg (ref 26.0–34.0)
MCHC: 34.7 g/dL (ref 30.0–36.0)
MCV: 97.1 fL (ref 80.0–100.0)
Platelets: 163 10*3/uL (ref 150–400)
RBC: 2.43 MIL/uL — ABNORMAL LOW (ref 3.87–5.11)
RDW: 13.1 % (ref 11.5–15.5)
WBC: 8.4 10*3/uL (ref 4.0–10.5)
nRBC: 0 % (ref 0.0–0.2)

## 2021-10-22 LAB — PREPARE RBC (CROSSMATCH)

## 2021-10-22 MED ORDER — SODIUM CHLORIDE 0.9% IV SOLUTION: Freq: Once | INTRAVENOUS | Status: AC

## 2021-10-22 MED ORDER — FERROUS SULFATE 325 (65 FE) MG PO TABS: 325.0000 mg | ORAL_TABLET | Freq: Two times a day (BID) | ORAL | Status: DC

## 2021-10-22 MED ORDER — SODIUM CHLORIDE 0.9 % IV SOLN: INTRAVENOUS | Status: DC

## 2021-10-22 NOTE — Progress Notes (Addendum)
Physical Therapy Treatment ?Patient Details ?Name: Rebekah King ?MRN: [id number] ?DOB: January 28, 1941 ?Today's Date: 10/22/2021 ? ? ?History of Present Illness Pt admitted from home s/p fall wtih L hip fx and now s/p L hemi-arthoplasty by posterior approach.  Pt with hx of Glaucoma, R TKR R TSR, "mild cognitive impairment" and hearing deficit ? ?  ?PT Comments  ? ?  Pt had episode of decr BP with OT earlier this am. BP sitting EOB 150/73 and HR 91, standing 137/71 and HR 99. Denies dizziness. Able to amb short distance however limited by fatigue. Pt requires frequent redirection to tasks, has baseline cognitive impairments. Will continue to follow   ?Recommendations for follow up therapy are one component of a multi-disciplinary discharge planning process, led by the attending physician.  Recommendations may be updated based on patient status, additional functional criteria and insurance authorization. ? ?Follow Up Recommendations ? HHPT vs SNF  ?  ?  ?Assistance Recommended at Discharge Frequent or constant Supervision/Assistance  ?Patient can return home with the following A little help with walking and/or transfers;A little help with bathing/dressing/bathroom;Assistance with cooking/housework;Assist for transportation;Help with stairs or ramp for entrance ?  ?Equipment Recommendations ? None recommended by PT  ?  ?Recommendations for Other Services   ? ? ?  ?Precautions / Restrictions Precautions ?Precautions: Fall;Posterior Hip ?Precaution Comments: pt recalls 3/3 hip precuations however gives additional info as "precautions"--reviewed posterior hip precautions ?Restrictions ?Weight Bearing Restrictions: No ?Other Position/Activity Restrictions: WBAT  ?  ? ?Mobility ? Bed Mobility ?Overal bed mobility: Needs Assistance ?Bed Mobility: Supine to Sit ?  ?  ?Supine to sit: Min assist ?  ?  ?General bed mobility comments: cues for adherence to posterior hip precautions, incr time, light assist to progress LLE off of  bed ?  ? ?Transfers ?Overall transfer level: Needs assistance ?Equipment used: Rolling walker (2 wheels) ?Transfers: Sit to/from Stand ?Sit to Stand: Min assist ?  ?  ?  ?  ?  ?General transfer comment: cues for LE management, adherence to THP and use of UEs to self assist ?  ? ?Ambulation/Gait ?Ambulation/Gait assistance: Min assist ?Gait Distance (Feet): 14 Feet ?Assistive device: Rolling walker (2 wheels) ?Gait Pattern/deviations: Step-to pattern, Trunk flexed ?Gait velocity: decr ?  ?  ?General Gait Details: cues for sequence, posture,  and position from RW. pt denied dizziness however stated she didn't "feel right: and needed seated rest ? ? ?Stairs ?  ?  ?  ?  ?  ? ? ?Wheelchair Mobility ?  ? ?Modified Rankin (Stroke Patients Only) ?  ? ? ?  ?Balance   ?  ?Sitting balance-Leahy Scale: Fair ?  ?  ?Standing balance support: Reliant on assistive device for balance, During functional activity, Bilateral upper extremity supported ?Standing balance-Leahy Scale: Poor ?  ?  ?  ?  ?  ?  ?  ?  ?  ?  ?  ?  ?  ? ?  ?Cognition Arousal/Alertness: Awake/alert ?Behavior During Therapy: [facility] for tasks assessed/performed ?Overall Cognitive Status: History of cognitive impairments - at baseline ?  ?  ?  ?  ?  ?  ?  ?  ?  ?  ?  ?  ?  ?  ?  ?  ?General Comments: patient noted to have some short term memory deficits during session; pt is internally distracted, requires redirection to task ?  ?  ? ?  ?Exercises   ? ?  ?General Comments   ?  ?  ? ?  Pertinent Vitals/Pain Pain Assessment ?Pain Assessment: Faces ?Faces Pain Scale: Hurts a little bit ?Pain Location: L hip ?Pain Descriptors / Indicators: Sore ?Pain Intervention(s): Limited activity within patient's tolerance, Monitored during session, Repositioned (offerred ice, pt declined)  ? ? ?Home Living   ?  ?  ?  ?  ?  ?  ?  ?  ?  ?   ?  ?Prior Function    ?  ?  ?   ? ?PT Goals (current goals can now be found in the care plan section) Acute Rehab PT Goals ?Patient Stated Goal: Regain  IND to return home ?PT Goal Formulation: With patient ?Time For Goal Achievement: 10/28/21 ?Potential to Achieve Goals: Good ?Progress towards PT goals: Progressing toward goals ? ?  ?Frequency ? ? ? Min 4X/week ? ? ? ?  ?PT Plan Current plan remains appropriate;Frequency needs to be updated  ? ? ?Co-evaluation   ?  ?  ?  ?  ? ?  ?AM-PAC PT "6 Clicks" Mobility   ?Outcome Measure ? Help needed turning from your back to your side while in a flat bed without using bedrails?: A Lot ?Help needed moving from lying on your back to sitting on the side of a flat bed without using bedrails?: A Little ?Help needed moving to and from a bed to a chair (including a wheelchair)?: A Little ?Help needed standing up from a chair using your arms (e.g., wheelchair or bedside chair)?: A Little ?Help needed to walk in hospital room?: A Little ?Help needed climbing 3-5 steps with a railing? : Total ?6 Click Score: 15 ? ?  ?End of Session Equipment Utilized During Treatment: Gait belt ?Activity Tolerance: Patient limited by fatigue ?Patient left: in chair;in CPM;with chair alarm set;with family/visitor present ?Nurse Communication: Mobility status ?PT Visit Diagnosis: Unsteadiness on feet (R26.81);Difficulty in walking, not elsewhere classified (R26.2);Pain ?Pain - Right/Left: Left ?Pain - part of body: Hip ?  ? ? ?Time: 0086-7619 ?PT Time Calculation (min) (ACUTE ONLY): 30 min ? ?Charges:  $Gait Training: 8-22 mins ?$Therapeutic Activity: 8-22 mins          ?          ? [name], PT ? ?Acute Rehab Dept [facility]) [phone] ?Pager [phone] ? ?10/22/2021 ? ? ? ?Rebekah King ?10/22/2021, 10:57 AM ? ?

## 2021-10-22 NOTE — Progress Notes (Signed)
Occupational Therapy Treatment ?Patient Details ?Name: Rebekah King ?MRN: [id number] ?DOB: 1940-12-09 ?Today's Date: 10/22/2021 ? ? ?History of present illness Pt admitted from home s/p fall wtih L hip fx and now s/p L hemi-arthoplasty by posterior approach.  Pt with hx of Glaucoma, R TKR R TSR, "mild cognitive impairment" and hearing deficit ?  ?OT comments ? Patient was educated on using AE for LB Dressing sitting EOB with SUP to complete tasks and cues to maintain hip precautions. Patient reported needing to use bathroom mid transfer to recliner for breakfast. patient required cues for sequencing of transfer with pushing through BUE. patient was able to transfer to commode when patient reported feeling dizziness and like she was going to be sick/pass out. nurse was called and then present in room. BP was taken while keeping patient talking with increased lethargicness noted. Cool washcloths provided as well. patients BP read 46/27 mmhg sitting on commode. patient was +2 to transfer back to bed. patient positioned in supine with BLE elevated with patient noted to be more alert with BP 125/ 82 mmhg. patient left in nursing care at this time.  ? ?Recommendations for follow up therapy are one component of a multi-disciplinary discharge planning process, led by the attending physician.  Recommendations may be updated based on patient status, additional functional criteria and insurance authorization. ?   ?Follow Up Recommendations ? Home health OT  ?  ?Assistance Recommended at Discharge Frequent or constant Supervision/Assistance  ?Patient can return home with the following ? A little help with walking and/or transfers;A little help with bathing/dressing/bathroom;Assistance with cooking/housework;Direct supervision/assist for financial management;Direct supervision/assist for medications management;Help with stairs or ramp for entrance;Assist for transportation ?  ?Equipment Recommendations ? Other (comment) (total  hip kit)  ?  ?Recommendations for Other Services   ? ?  ?Precautions / Restrictions Precautions ?Precautions: Fall;Posterior Hip ?Precaution Comments: Pt recalls 2/3 THR - all precautions reviewed ?Restrictions ?Weight Bearing Restrictions: No ?Other Position/Activity Restrictions: WBAT  ? ? ?  ? ?Mobility Bed Mobility ?Overal bed mobility: Needs Assistance ?Bed Mobility: Sit to Supine ?  ?  ?Supine to sit: Mod assist, HOB elevated ?  ?  ?General bed mobility comments: cues for sequence, use of R LE to self assist and adherence to THP.  Physical assist to manage LEs ?  ? ?Transfers ?  ?  ?  ?  ?  ?  ?  ?  ?  ?  ?  ?  ?Balance   ?  ?  ?  ?  ?  ?  ?  ?  ?  ?  ?  ?  ?  ?  ?  ?  ?  ?  ?   ? ?ADL either performed or assessed with clinical judgement  ? ?ADL Overall ADL's : Needs assistance/impaired ?  ?  ?  ?  ?  ?  ?  ?  ?  ?  ?Lower Body Dressing: Minimal assistance;Sitting/lateral leans ?Lower Body Dressing Details (indicate cue type and reason): EOB with education on using AE. patient was able to demonstrate understanding with increased time and cues for safety and to maintain posterior hip precautions. ?Toilet Transfer: Min guard;Rolling walker (2 wheels);Adhering to hip precautions;Cueing for safety;Cueing for sequencing;BSC/3in1 ?Toilet Transfer Details (indicate cue type and reason): Patient reported needing to use bathroom mid transfer to recliner. patient required cues for sequencing of transfer with pushing through BUE. patient was able to transfer to commode when patient reported feeling dizziness and like  she was going to be sick. nurse was called. BP was taken while kepeing patient talking with increased lethargicness noted. patietns BP read 46/27 mmhg sitting on commode. patient was +2 to transfer back to bed. patient positioned in supine with BLE elevated with patient noted to be more alert with BP 125/ 82 mmhg. patient left in nursing care at this time. ?  ?  ?  ?  ?  ?  ?  ? ?Extremity/Trunk Assessment    ?  ?  ?  ?  ?  ? ?Vision   ?  ?  ?Perception   ?  ?Praxis   ?  ? ?Cognition Arousal/Alertness: Awake/alert ?Behavior During Therapy: [facility] for tasks assessed/performed ?Overall Cognitive Status: Within Functional Limits for tasks assessed ?  ?  ?  ?  ?  ?  ?  ?  ?  ?  ?  ?  ?  ?  ?  ?  ?General Comments: patient noted to have some short term memory deficits during session ?  ?  ?   ?Exercises   ? ?  ?Shoulder Instructions   ? ? ?  ?General Comments    ? ? ?Pertinent Vitals/ Pain       Pain Assessment ?Pain Assessment: Faces ?Faces Pain Scale: Hurts a little bit ?Pain Location: L hip ?Pain Descriptors / Indicators: Aching, Sore ?Pain Intervention(s): Limited activity within patient's tolerance, Monitored during session ? ?Home Living   ?  ?  ?  ?  ?  ?  ?  ?  ?  ?  ?  ?  ?  ?  ?  ?  ?  ?  ? ?  ?Prior Functioning/Environment    ?  ?  ?  ?   ? ?Frequency ? Min 2X/week  ? ? ? ? ?  ?Progress Toward Goals ? ?OT Goals(current goals can now be found in the care plan section) ? Progress towards OT goals: Progressing toward goals ? ?   ?Plan Discharge plan remains appropriate   ? ?Co-evaluation ? ? ?   ?  ?  ?  ?  ? ?  ?AM-PAC OT "6 Clicks" Daily Activity     ?Outcome Measure ? ? Help from another person eating meals?: None ?Help from another person taking care of personal grooming?: A Little ?Help from another person toileting, which includes using toliet, bedpan, or urinal?: A Lot ?Help from another person bathing (including washing, rinsing, drying)?: A Lot ?Help from another person to put on and taking off regular upper body clothing?: A Little ?Help from another person to put on and taking off regular lower body clothing?: A Lot ?6 Click Score: 16 ? ?  ?End of Session Equipment Utilized During Treatment: Rolling walker (2 wheels);Gait belt ? ?OT Visit Diagnosis: Other abnormalities of gait and mobility (R26.89) ?  ?Activity Tolerance Treatment limited secondary to medical complications (Comment) ?  ?Patient Left in  bed;with call bell/phone within reach;with bed alarm set;with nursing/sitter in room ?  ?Nurse Communication Mobility status;Other (comment) (nurse present with patient onset of feeling like she was going to pass out) ?  ? ?   ? ?Time: 0947-0962 ?OT Time Calculation (min): 32 min ? ?Charges: OT General Charges ?$OT Visit: 1 Visit ?OT Treatments ?$Self Care/Home Management : 23-37 mins ? ?Joylene Wescott OTR/L, MS ?Acute Rehabilitation Department ?Office# [phone] ?Pager# [phone] ? ? ?Rebekah King ?10/22/2021, 8:33 AM ?

## 2021-10-22 NOTE — Progress Notes (Signed)
?  Progress Note ? ? ?Patient: Rebekah King NTZ:[id number] DOB: 1941/02/07 DOA: 10/19/2021     3 ?DOS: the patient was seen and examined on 10/22/2021 ?  ?Brief hospital course: ?81 year old with past medical history significant for hypothyroidism, glaucoma, GERD, history of TIAs recently started on Plavix for TIA was brought to the ED after a mechanical  fall at home and subsequently patient complaining of left hip pain.  Patient was found to have a left hip comminuted fracture by x-ray.   ?Underwent left hip unipolar hemiarthroplasty by Dr. [name] on 3/25. ? ?Patient had episode of orthostatic hypotension this am. Plan to hold BP medication and start IV fluids.  ? ?Assessment and Plan: ?* Closed left hip fracture ([place]) ?-Patient admitted after mechanical fall found to have left hip fracture. ?Hip/pelvis X-ray: Mildly displaced, angulated, impacted left subcapital femoral neck fracture.  Signs of prior ORIF of the right proximal femoral with mild deformity also on the sides, findings suggest previous fracture in this location. ?-Dr. [name] consulted. ?-Pain management with as needed morphine and Vicodin.  ?-Underwent Left hip unipolar hemiarthroplasty by Dr. [name] on 3/25.  ?-Plavix resume plus aspirin started for DVT prophylaxis.  ? ?Orthostatic hypotension ?She had episode of orthostatic hypotension, with near syncope this am, while working with OT>  ?Plan to hold BP medications.  ?Start IV fluids.  ?She was abel to work with PT later, but still having fatigue, lightheaded.  ?Monitor and start IV fluids today, will proceed with one unit PRBC>  ? ?Acute postoperative anemia due to expected blood loss ?Patient presented with Hb at 11--10.  ?Decreased to 8 post op , expected.  ?Symptomatic, orthostatic, near syncope episode, plan to proceed with one unit PRBC>  ? ? ?Hypokalemia ?Replaced.  ? ?Essential hypertension ?Plan to hold : Norvasc, hydralazine and Cozaar. ?Due to orthostatic hypotension. Resume as need if  BP increases.  ? ?TIA (transient ischemic attack) ?Controlled BP ?Plavix resume 3/26 ? ?Hypothyroidism ?Continue with Synthroid ? ?Hypercholesterolemia ?Continue with Zocor ? ?Osteoporosis ?Vitamin D normal at 51 ? ?Gastroesophageal reflux disease ?Continue with Pepcid and Protonix ? ? ? ? ?  ? ?Subjective: she had episode this morning almost pass out.  ?When she work with PT later she got tired, fatigue, lightheaded.  ? ?Physical Exam: ?Vitals:  ? 10/22/21 0825 10/22/21 0930 10/22/21 1100 10/22/21 1324  ?BP: 124/68 (!) 125/57  (!) 160/72  ?Pulse: 76 90  92  ?Resp:  16  16  ?Temp:  98.5 ?F (36.9 ?C)  98.3 ?F (36.8 ?C)  ?TempSrc:      ?SpO2:  95% 95% 96%  ?Weight:      ?Height:      ? ?General; NAD ?Lungs; CTA ?Neuro; non focal.  ? ?Data Reviewed: ? ?Cbc and Bmet  ? ?Family Communication: husband at bedside.  ? ?Disposition: ?Status is: Inpatient ?Remains inpatient appropriate because: needs Blood transfusion, symptomatic anemia ? Planned Discharge Destination: Home ? ? ? ?Time spent: 45 minutes ? ?Author: ?Elmarie Shiley, MD ?10/22/2021 1:25 PM ? ?For on call review [email or web address].  ?

## 2021-10-22 NOTE — Assessment & Plan Note (Addendum)
Patient presented with Hb at 11--10.  ?Decreased to 8 post op , expected.  ?Symptomatic, orthostatic, near syncope episode, Had  one unit PRBC 3/27. Hb increase 9.  ? ?

## 2021-10-22 NOTE — Assessment & Plan Note (Addendum)
She had episode of orthostatic hypotension, with near syncope the morning of 3/27, while working with OT>. ?BP medications held.  ?Received  IV fluids.  ?Received one unit PRBC>  ?Asymptomatic today.  ?

## 2021-10-22 NOTE — Progress Notes (Signed)
? ? ? ?Subjective: ?2 Days Post-Op s/p Procedure(s): ?ARTHROPLASTY BIPOLAR HIP (HEMIARTHROPLASTY) ? ? Patient is alert, oriented, sitting up in bed. States pain has overall been well controlled. She repeats 3 of the posterior hip precautions to me, saying she needs to having them memorized before she leaves.  Denies chest pain, SOB, Calf pain. No nausea/vomiting. No other complaints. ? ?Objective:  ?PE: ?VITALS:   ?Vitals:  ? 10/21/21 1122 10/21/21 1602 10/21/21 2024 10/22/21 0435  ?BP:  139/71 (!) 155/83 (!) 158/74  ?Pulse:  94 100 95  ?Resp: '18 17 17 16  '$ ?Temp: 98.8 ?F (37.1 ?C) 98.3 ?F (36.8 ?C) 99.1 ?F (37.3 ?C) 99.8 ?F (37.7 ?C)  ?TempSrc: Oral Oral    ?SpO2:  96% 99% 96%  ?Weight:      ?Height:      ? ? ?ABD soft ?Sensation intact distally ?Intact pulses distally ?Dorsiflexion/Plantar flexion intact ?Incision: dressing C/D/I ?No significant ecchymosis or edema at left hip ? ?LABS ? ?Results for orders placed or performed during the hospital encounter of 10/19/21 (from the past 24 hour(s))  ?CBC     Status: Abnormal  ? Collection Time: 10/22/21  3:15 AM  ?Result Value Ref Range  ? WBC 8.4 4.0 - 10.5 K/uL  ? RBC 2.43 (L) 3.87 - 5.11 MIL/uL  ? Hemoglobin 8.2 (L) 12.0 - 15.0 g/dL  ? HCT 23.6 (L) 36.0 - 46.0 %  ? MCV 97.1 80.0 - 100.0 fL  ? MCH 33.7 26.0 - 34.0 pg  ? MCHC 34.7 30.0 - 36.0 g/dL  ? RDW 13.1 11.5 - 15.5 %  ? Platelets 163 150 - 400 K/uL  ? nRBC 0.0 0.0 - 0.2 %  ?Basic metabolic panel     Status: Abnormal  ? Collection Time: 10/22/21  3:15 AM  ?Result Value Ref Range  ? Sodium 134 (L) 135 - 145 mmol/L  ? Potassium 3.5 3.5 - 5.1 mmol/L  ? Chloride 107 98 - 111 mmol/L  ? CO2 21 (L) 22 - 32 mmol/L  ? Glucose, Bld 104 (H) 70 - 99 mg/dL  ? BUN 11 8 - 23 mg/dL  ? Creatinine, Ser 0.57 0.44 - 1.00 mg/dL  ? Calcium 8.1 (L) 8.9 - 10.3 mg/dL  ? GFR, Estimated >60 >60 mL/min  ? Anion gap 6 5 - 15  ? ? ?Pelvis Portable ? ?Result Date: 10/20/2021 ?CLINICAL DATA:  Postop left hip hemiarthroplasty. EXAM: PORTABLE  PELVIS 1-2 VIEWS COMPARISON:  10/19/2021.  09/19/2020. FINDINGS: New left proximal femur hemiarthroplasty appears well seated and aligned. No acute fracture or evidence of an operative complication. Parallel linear areas of sclerosis in the proximal right femur consistent with tracks from prior screws, stable. Right hip joint, SI joints and symphysis pubis are normally aligned. Postoperative soft tissue edema and air adjacent to the left hip/proximal femur. IMPRESSION: 1. Well-positioned left hip hemiarthroplasty. 2. No acute fracture or evidence of an operative complication. Electronically Signed   By: Lajean Manes M.D.   On: 10/20/2021 12:19  ? ?DG HIP UNILAT WITH PELVIS 2-3 VIEWS LEFT ? ?Result Date: 10/20/2021 ?CLINICAL DATA:  Left hip hemiarthroplasty. EXAM: DG HIP (WITH OR WITHOUT PELVIS) 2-3V LEFT COMPARISON:  10/19/2021. FINDINGS: Left hip hemiarthroplasty appears well seated and aligned. There are adjacent postoperative soft tissue changes. No acute fracture or evidence of an operative complication. IMPRESSION: Well-positioned left hip hemiarthroplasty. Electronically Signed   By: Lajean Manes M.D.   On: 10/20/2021 12:17   ? ?Assessment/Plan: ?Closed left hip fracture  in patient with recent TIA in February on Plavix ? ?2 Days Post-Op s/p Procedure(s): ?ARTHROPLASTY BIPOLAR HIP (HEMIARTHROPLASTY) ? ?Acute Blood Loss Anemia: ?Hbg 8.1, will continue to monitor as well as symptomaticly, not at transfusion level ? ?Weightbearing: WBAT LLE ?Insicional and dressing care: Reinforce dressings as needed ?VTE prophylaxis: Plavix and ASA ?Pain control: continue current regimen ?Follow - up plan: 2 weeks with Dr. [name] ?Dispo: pending progress with PT, patient will need to pass stair training to discharge home ? ?Please contact Josh Chadwell PA-C for any questions, concerns moving forward ? ? ?Rebekah King ?10/22/2021, 9:24 AM  ?

## 2021-10-23 ENCOUNTER — Encounter (HOSPITAL_COMMUNITY): Payer: Self-pay | Admitting: Orthopedic Surgery

## 2021-10-23 DIAGNOSIS — S72002A Fracture of unspecified part of neck of left femur, initial encounter for closed fracture: Secondary | ICD-10-CM | POA: Diagnosis not present

## 2021-10-23 LAB — BPAM RBC
Blood Product Expiration Date: 202304102359
ISSUE DATE / TIME: 202303271506
Unit Type and Rh: 600

## 2021-10-23 LAB — CBC
HCT: 27.5 % — ABNORMAL LOW (ref 36.0–46.0)
Hemoglobin: 9.6 g/dL — ABNORMAL LOW (ref 12.0–15.0)
MCH: 32.9 pg (ref 26.0–34.0)
MCHC: 34.9 g/dL (ref 30.0–36.0)
MCV: 94.2 fL (ref 80.0–100.0)
Platelets: 173 10*3/uL (ref 150–400)
RBC: 2.92 MIL/uL — ABNORMAL LOW (ref 3.87–5.11)
RDW: 13.4 % (ref 11.5–15.5)
WBC: 7.4 10*3/uL (ref 4.0–10.5)
nRBC: 0 % (ref 0.0–0.2)

## 2021-10-23 LAB — TYPE AND SCREEN
ABO/RH(D): A POS
Antibody Screen: NEGATIVE
Unit division: 0

## 2021-10-23 MED ORDER — HYDROCODONE-ACETAMINOPHEN 5-325 MG PO TABS: 1.0000 | ORAL_TABLET | ORAL | 0 refills | Status: DC | PRN

## 2021-10-23 MED ORDER — ACETAMINOPHEN 325 MG PO TABS: 325.0000 mg | ORAL_TABLET | Freq: Four times a day (QID) | ORAL | 0 refills | Status: DC | PRN

## 2021-10-23 MED ORDER — DOCUSATE SODIUM 100 MG PO CAPS: 100.0000 mg | ORAL_CAPSULE | Freq: Two times a day (BID) | ORAL | 0 refills | Status: DC

## 2021-10-23 MED ORDER — FERROUS SULFATE 325 (65 FE) MG PO TABS: 325.0000 mg | ORAL_TABLET | Freq: Three times a day (TID) | ORAL | 3 refills | Status: DC

## 2021-10-23 MED ORDER — ASPIRIN 81 MG PO CHEW: 81.0000 mg | CHEWABLE_TABLET | Freq: Two times a day (BID) | ORAL | 0 refills | Status: AC

## 2021-10-23 NOTE — Progress Notes (Signed)
Physical Therapy Treatment ?Patient Details ?Name: Rebekah King ?MRN: [id number] ?DOB: [birth year] ?Today's Date: 10/23/2021 ? ? ?History of Present Illness Pt admitted from home s/p fall wtih L hip fx and now s/p L hemi-arthoplasty by posterior approach.  Pt with hx of Glaucoma, R TKR R TSR, "mild cognitive impairment" and hearing deficit ? ?  ?PT Comments  ? ? Pt progressing with PT. Reviewed stairs, see below for details. Will leave handout for pt husband as well. Pt ready to d/c with family assist as needed.  ?Recommendations for follow up therapy are one component of a multi-disciplinary discharge planning process, led by the attending physician.  Recommendations may be updated based on patient status, additional functional criteria and insurance authorization. ? ?Follow Up Recommendations ? Home health PT ?  ?  ?Assistance Recommended at Discharge Frequent or constant Supervision/Assistance  ?Patient can return home with the following A little help with walking and/or transfers;A little help with bathing/dressing/bathroom;Assistance with cooking/housework;Assist for transportation;Help with stairs or ramp for entrance ?  ?Equipment Recommendations ? None recommended by PT  ?  ?Recommendations for Other Services   ? ? ?  ?Precautions / Restrictions Precautions ?Precautions: Fall;Posterior Hip ?Precaution Comments: pt recalls 3/3 hip precuations however gives additional info as "precautions"--reviewed posterior hip precautions ?Restrictions ?Weight Bearing Restrictions: No ?Other Position/Activity Restrictions: WBAT  ?  ? ?Mobility ? Bed Mobility ?Overal bed mobility: Needs Assistance ?Bed Mobility: Supine to Sit ?  ?  ?Supine to sit: Min assist ?  ?  ?General bed mobility comments: cues for adherence to posterior hip precautions, incr time, light assist to progress LLE off of bed ?  ? ?Transfers ?Overall transfer level: Needs assistance ?Equipment used: Rolling walker (2 wheels) ?Transfers: Sit to/from  Stand ?Sit to Stand: Min assist ?  ?  ?  ?  ?  ?General transfer comment: cues for LE management, adherence to THP and use of UEs to self assist ?  ? ?Ambulation/Gait ?Ambulation/Gait assistance: Min guard ?Gait Distance (Feet): 45 Feet ?Assistive device: Rolling walker (2 wheels) ?Gait Pattern/deviations: Step-to pattern, Trunk flexed ?Gait velocity: decr ?  ?  ?General Gait Details: cues for sequence, posture,  and position from RW. pt denied dizziness ? ? ?Stairs ?Stairs: Yes ?Stairs assistance: Min assist ?Stair Management: Step to pattern, Forwards, With walker, No rails ?Number of Stairs: 2 ?General stair comments: cues for sequence, min assist to min guard for balance and to maneuver RW ? ? ?Wheelchair Mobility ?  ? ?Modified Rankin (Stroke Patients Only) ?  ? ? ?  ?Balance   ?Sitting-balance support: No upper extremity supported, Feet supported ?Sitting balance-Leahy Scale: Good ?  ?  ?Standing balance support: Reliant on assistive device for balance, During functional activity, Bilateral upper extremity supported ?Standing balance-Leahy Scale: Poor ?  ?  ?  ?  ?  ?  ?  ?  ?  ?  ?  ?  ?  ? ?  ?Cognition Arousal/Alertness: Awake/alert ?Behavior During Therapy: [facility] for tasks assessed/performed ?Overall Cognitive Status: History of cognitive impairments - at baseline ?  ?  ?  ?  ?  ?  ?  ?  ?  ?  ?  ?  ?  ?  ?  ?  ?General Comments: patient noted to have some short term memory deficits during session; pt is internally distracted, requires redirection to task ?  ?  ? ?  ?Exercises Total Joint Exercises ?Ankle Circles/Pumps: AROM, Both, 15 reps, Supine ? ?  ?  General Comments   ?  ?  ? ?Pertinent Vitals/Pain Pain Assessment ?Pain Assessment: Faces ?Pain Score: 2  ?Faces Pain Scale: Hurts a little bit ?Pain Location: L hip ?Pain Descriptors / Indicators: Sore ?Pain Intervention(s): Limited activity within patient's tolerance  ? ? ?Home Living   ?  ?  ?  ?  ?  ?  ?  ?  ?  ?   ?  ?Prior Function    ?  ?  ?   ? ?PT  Goals (current goals can now be found in the care plan section) Acute Rehab PT Goals ?Patient Stated Goal: Regain IND to return home ?PT Goal Formulation: With patient ?Time For Goal Achievement: 10/28/21 ?Potential to Achieve Goals: Good ?Progress towards PT goals: Progressing toward goals ? ?  ?Frequency ? ? ? Min 4X/week ? ? ? ?  ?PT Plan Current plan remains appropriate;Frequency needs to be updated  ? ? ?Co-evaluation   ?  ?  ?  ?  ? ?  ?AM-PAC PT "6 Clicks" Mobility   ?Outcome Measure ? Help needed turning from your back to your side while in a flat bed without using bedrails?: A Little ?Help needed moving from lying on your back to sitting on the side of a flat bed without using bedrails?: A Little ?Help needed moving to and from a bed to a chair (including a wheelchair)?: A Little ?Help needed standing up from a chair using your arms (e.g., wheelchair or bedside chair)?: A Little ?Help needed to walk in hospital room?: A Little ?Help needed climbing 3-5 steps with a railing? : A Little ?6 Click Score: 18 ? ?  ?End of Session Equipment Utilized During Treatment: Gait belt ?Activity Tolerance: Patient tolerated treatment well ?Patient left: with call bell/phone within reach;with bed alarm set;in bed;with family/visitor present ?Nurse Communication: Mobility status ?PT Visit Diagnosis: Unsteadiness on feet (R26.81);Difficulty in walking, not elsewhere classified (R26.2);Pain ?Pain - Right/Left: Left ?Pain - part of body: Hip ?  ? ? ?Time: 7371-0626 ?PT Time Calculation (min) (ACUTE ONLY): 19 min ? ?Charges:  $Gait Training: 8-22 mins          ?         [name], PT ? ?Acute Rehab Dept [facility]) [phone] ?Pager [phone] ? ?10/23/2021 ? ? ? ?Rebekah King ?10/23/2021, 4:51 PM ? ?

## 2021-10-23 NOTE — Progress Notes (Signed)
Physical Therapy Treatment ?Patient Details ?Name: Rebekah King ?MRN: [id number] ?DOB: Dec 24, 1940 ?Today's Date: 10/23/2021 ? ? ?History of Present Illness Pt admitted from home s/p fall wtih L hip fx and now s/p L hemi-arthoplasty by posterior approach.  Pt with hx of Glaucoma, R TKR R TSR, "mild cognitive impairment" and hearing deficit ? ?  ?PT Comments  ? ? Pt progressing well today. No dizziness with amb and able to tolerate incr distance. Husband present and assisting as needed. Will see again to review stairs with pt and husband    ?Recommendations for follow up therapy are one component of a multi-disciplinary discharge planning process, led by the attending physician.  Recommendations may be updated based on patient status, additional functional criteria and insurance authorization. ? ?Follow Up Recommendations ? Home health PT ?  ?  ?Assistance Recommended at Discharge Frequent or constant Supervision/Assistance  ?Patient can return home with the following A little help with walking and/or transfers;A little help with bathing/dressing/bathroom;Assistance with cooking/housework;Assist for transportation;Help with stairs or ramp for entrance ?  ?Equipment Recommendations ? None recommended by PT  ?  ?Recommendations for Other Services   ? ? ?  ?Precautions / Restrictions Precautions ?Precautions: Fall;Posterior Hip ?Precaution Comments: pt recalls 3/3 hip precuations however gives additional info as "precautions"--reviewed posterior hip precautions ?Restrictions ?Weight Bearing Restrictions: No ?Other Position/Activity Restrictions: WBAT  ?  ? ?Mobility ? Bed Mobility ?Overal bed mobility: Needs Assistance ?Bed Mobility: Supine to Sit ?  ?  ?Supine to sit: Min assist ?  ?  ?General bed mobility comments: cues for adherence to posterior hip precautions, incr time, light assist to progress LLE off of bed ?  ? ?Transfers ?Overall transfer level: Needs assistance ?Equipment used: Rolling walker (2  wheels) ?Transfers: Sit to/from Stand ?Sit to Stand: Min assist ?  ?  ?  ?  ?  ?General transfer comment: cues for LE management, adherence to THP and use of UEs to self assist ?  ? ?Ambulation/Gait ?Ambulation/Gait assistance: Min guard ?Gait Distance (Feet): 45 Feet ?Assistive device: Rolling walker (2 wheels) ?Gait Pattern/deviations: Step-to pattern, Trunk flexed ?  ?  ?  ?General Gait Details: cues for sequence, posture,  and position from RW. pt denied dizziness ? ? ?Stairs ?  ?  ?  ?  ?  ? ? ?Wheelchair Mobility ?  ? ?Modified Rankin (Stroke Patients Only) ?  ? ? ?  ?Balance   ?Sitting-balance support: No upper extremity supported, Feet supported ?Sitting balance-Leahy Scale: Good ?  ?  ?Standing balance support: Reliant on assistive device for balance, During functional activity, Bilateral upper extremity supported ?Standing balance-Leahy Scale: Poor ?  ?  ?  ?  ?  ?  ?  ?  ?  ?  ?  ?  ?  ? ?  ?Cognition Arousal/Alertness: Awake/alert ?Behavior During Therapy: [facility] for tasks assessed/performed ?Overall Cognitive Status: History of cognitive impairments - at baseline ?  ?  ?  ?  ?  ?  ?  ?  ?  ?  ?  ?  ?  ?  ?  ?  ?General Comments: patient noted to have some short term memory deficits during session; pt is internally distracted, requires redirection to task ?  ?  ? ?  ?Exercises Total Joint Exercises ?Ankle Circles/Pumps: AROM, Both, 15 reps, Supine ? ?  ?General Comments   ?  ?  ? ?Pertinent Vitals/Pain Pain Assessment ?Pain Assessment: Faces ?Pain Score: 2  ?Faces Pain Scale:  Hurts a little bit ?Pain Location: L hip ?Pain Descriptors / Indicators: Sore ?Pain Intervention(s): Limited activity within patient's tolerance, Monitored during session, Premedicated before session, Repositioned  ? ? ?Home Living   ?  ?  ?  ?  ?  ?  ?  ?  ?  ?   ?  ?Prior Function    ?  ?  ?   ? ?PT Goals (current goals can now be found in the care plan section) Acute Rehab PT Goals ?Patient Stated Goal: Regain IND to return home ?PT  Goal Formulation: With patient ?Time For Goal Achievement: 10/28/21 ?Potential to Achieve Goals: Good ?Progress towards PT goals: Progressing toward goals ? ?  ?Frequency ? ? ? Min 4X/week ? ? ? ?  ?PT Plan Current plan remains appropriate;Frequency needs to be updated  ? ? ?Co-evaluation   ?  ?  ?  ?  ? ?  ?AM-PAC PT "6 Clicks" Mobility   ?Outcome Measure ? Help needed turning from your back to your side while in a flat bed without using bedrails?: A Little ?Help needed moving from lying on your back to sitting on the side of a flat bed without using bedrails?: A Little ?Help needed moving to and from a bed to a chair (including a wheelchair)?: A Little ?Help needed standing up from a chair using your arms (e.g., wheelchair or bedside chair)?: A Little ?Help needed to walk in hospital room?: A Little ?Help needed climbing 3-5 steps with a railing? : A Little ?6 Click Score: 18 ? ?  ?End of Session Equipment Utilized During Treatment: Gait belt ?Activity Tolerance: Patient tolerated treatment well ?Patient left: with call bell/phone within reach;with bed alarm set;in bed;with family/visitor present ?Nurse Communication: Mobility status ?PT Visit Diagnosis: Unsteadiness on feet (R26.81);Difficulty in walking, not elsewhere classified (R26.2);Pain ?Pain - Right/Left: Left ?Pain - part of body: Hip ?  ? ? ?Time: 1211-1228 ?PT Time Calculation (min) (ACUTE ONLY): 17 min ? ?Charges:  $Gait Training: 8-22 mins          ?          ? Rebekah King, PT ? ?Acute Rehab Dept [facility]) [phone] ?Pager [phone] ? ?10/23/2021 ? ? ? ?Rebekah King ?10/23/2021, 1:45 PM ? ?

## 2021-10-23 NOTE — Discharge Summary (Signed)
?Physician Discharge Summary ?  ?Patient: Rebekah King MRN: [id number] DOB: 02/18/1941  ?Admit date:     10/19/2021  ?Discharge date: 10/23/21  ?Discharge Physician: Jerald Kief A Lakeidra Reliford  ? ?PCP: Manon Hilding, MD  ? ?Recommendations at discharge:  ? ? Needs to follow up with Dr [name] two weeks post op/  ?Needs repeat CBC to follow hb.  ?Needs follow up BP and Orthostatic vitals.  ? ?Discharge Diagnoses: ?Principal Problem: ?  Closed left hip fracture ([place]) ?Active Problems: ?  Epiretinal membrane (ERM) of right eye ?  Gastroesophageal reflux disease ?  Osteoporosis ?  Hypercholesterolemia ?  Hypothyroidism ?  TIA (transient ischemic attack) ?  Essential hypertension ?  Hypokalemia ?  Acute postoperative anemia due to expected blood loss ?  Orthostatic hypotension ? ?Resolved Problems: ?  * No resolved hospital problems. * ? ?Hospital Course: ?81 year old with past medical history significant for hypothyroidism, glaucoma, GERD, history of TIAs recently started on Plavix for TIA was brought to the ED after a mechanical  fall at home and subsequently patient complaining of left hip pain.  Patient was found to have a left hip comminuted fracture by x-ray.   ?Underwent left hip unipolar hemiarthroplasty by Dr. [name] on 3/25. ? ?Patient had episode of orthostatic hypotension this am. Improved with IV fluids, Blood transfusion and holding Medications.  ?Patient feels better today.  ? ? ? ?Assessment and Plan: ?* Closed left hip fracture ([place]) ?-Patient admitted after mechanical fall found to have left hip fracture. ?Hip/pelvis X-ray: Mildly displaced, angulated, impacted left subcapital femoral neck fracture.  Signs of prior ORIF of the right proximal femoral with mild deformity also on the sides, findings suggest previous fracture in this location. ?-Dr. [name] consulted. ?-Pain management with as needed morphine and Vicodin.  ?-Underwent Left hip unipolar hemiarthroplasty by Dr. [name] on 3/25.  ?-Plavix resume  plus aspirin started for DVT prophylaxis.  ? ?Orthostatic hypotension ?She had episode of orthostatic hypotension, with near syncope the morning of 3/27, while working with OT>. ?BP medications held.  ?Received  IV fluids.  ?Received one unit PRBC>  ?Asymptomatic today.  ? ?Acute postoperative anemia due to expected blood loss ?Patient presented with Hb at 11--10.  ?Decreased to 8 post op , expected.  ?Symptomatic, orthostatic, near syncope episode, Had  one unit PRBC 3/27. Hb increase 9.  ? ? ?Hypokalemia ?Replaced.  ? ?Essential hypertension ?Resume  Norvasc,Cozaar. ?Not symptomatic today from orthostatic hypotension.  ?Hold Hydralazine.  ? ?TIA (transient ischemic attack) ?Controlled BP ?Plavix resume 3/26 ? ?Hypothyroidism ?Continue with Synthroid ? ?Hypercholesterolemia ?Continue with Zocor ? ?Osteoporosis ?Vitamin D normal at 51 ? ?Gastroesophageal reflux disease ?Continue with Pepcid and Protonix ? ? ? ? ?  ? ? ?Consultants: Dr [name]  ?Procedures performed: Left unipolar hemiarthroplasty ?Disposition: Home ?Diet recommendation:  ?Discharge Diet Orders (From admission, onward)  ? ?  Start     Ordered  ? 10/23/21 0000  Diet - low sodium heart healthy       ? 10/23/21 1022  ? ?  ?  ? ?  ? ?Cardiac diet ?DISCHARGE MEDICATION: ?Allergies as of 10/23/2021   ? ?   Reactions  ? Brimonidine Other (See Comments)  ? Severe dry mouth  ? Timolol Other (See Comments)  ? Lightheaded  ? Beef-derived Products   ? After being bitten by tick  ? Misc. Sulfonamide Containing Compounds   ? Amoxicillin-pot Clavulanate Diarrhea  ? GI pain  ? Sulfa Antibiotics Nausea Only, Rash  ?  Hands go numb  ? ?  ? ?  ?Medication List  ?  ? ?STOP taking these medications   ? ?hydrALAZINE 10 MG tablet ?Commonly known as: APRESOLINE ?  ?meloxicam 15 MG tablet ?Commonly known as: MOBIC ?  ? ?  ? ?TAKE these medications   ? ?acetaminophen 325 MG tablet ?Commonly known as: TYLENOL ?Take 1-2 tablets (325-650 mg total) by mouth every 6 (six) hours as  needed for mild pain (pain score 1-3 or temp > 100.5). Total acetaminophen between this and your pain med (hydrocodone/acetaminophen), max 3000 mg daily. ?  ?amLODipine 5 MG tablet ?Commonly known as: NORVASC ?Take 1 tablet (5 mg total) by mouth daily. ?  ?aspirin 81 MG chewable tablet ?Chew 1 tablet (81 mg total) by mouth 2 (two) times daily. ?  ?CALCIUM 600+D3 PO ?Take 1 tablet by mouth in the morning and at bedtime. ?  ?clopidogrel 75 MG tablet ?Commonly known as: PLAVIX ?Take 1 tablet (75 mg total) by mouth daily. ?  ?dicyclomine 20 MG tablet ?Commonly known as: BENTYL ?Take 10 mg by mouth 4 (four) times daily. ?  ?docusate sodium 100 MG capsule ?Commonly known as: COLACE ?Take 1 capsule (100 mg total) by mouth 2 (two) times daily. ?  ?dorzolamide 2 % ophthalmic solution ?Commonly known as: TRUSOPT ?Place 1 drop into both eyes 3 (three) times daily. ?  ?ferrous sulfate 325 (65 FE) MG tablet ?Take 1 tablet (325 mg total) by mouth 3 (three) times daily after meals. ?  ?HYDROcodone-acetaminophen 5-325 MG tablet ?Commonly known as: NORCO/VICODIN ?Take 1 tablet by mouth every 4 (four) hours as needed for moderate pain. ?  ?latanoprost 0.005 % ophthalmic solution ?Commonly known as: XALATAN ?Place 1 drop into both eyes at bedtime. ?  ?levothyroxine 50 MCG tablet ?Commonly known as: SYNTHROID ?Take 50 mcg by mouth every evening. ?  ?losartan 100 MG tablet ?Commonly known as: COZAAR ?Take 100 mg by mouth daily. ?  ?multivitamin with minerals Tabs tablet ?Take 1 tablet by mouth daily. ?  ?nitrofurantoin 50 MG capsule ?Commonly known as: MACRODANTIN ?Take 1 capsule (50 mg total) by mouth at bedtime. Take 1 capsule (50 mg total) by mouth at bedtime. ?  ?pantoprazole 40 MG tablet ?Commonly known as: PROTONIX ?Take 40 mg by mouth daily. ?  ?simvastatin 20 MG tablet ?Commonly known as: ZOCOR ?Take 20 mg by mouth every evening. ?  ? ?  ? ?  ?  ? ? ?  ?Discharge Care Instructions  ?(From admission, onward)  ?  ? ? ?  ? ?  Start      Ordered  ? 10/23/21 0000  Discharge wound care:       ?Comments: See above  ? 10/23/21 1022  ? ?  ?  ? ?  ? ? Follow-up Information   ? ? Earlie Server, MD. Schedule an appointment as soon as possible for a visit in 2 week(s).   ?Specialty: Orthopedic Surgery ?Contact information: ?[address] ?[address] ?[place] [address] ?[phone] ? ? ?  ?  ? ? [facility] [place] Follow up.   ?Specialty: Home Health Services ?Why: to provide home health physical and occupational therapy visits ?Contact information: ?[address] ?[address] ?[place] [address] ?[phone] ? ? ?  ?  ? ?  ?  ? ?  ? ?Discharge Exam: ?Danley Danker Weights  ? 10/19/21 1627  ?Weight: 53.5 kg  ? ?General: Alert in no acute distress ?Cardiovascular: S1-S2 regular rhythm or  rate ?Abdomen: Bowel sounds present, soft nontender nondistended ? ?Condition at discharge: stable ? ?The results of significant diagnostics from this hospitalization (including imaging, microbiology, ancillary and laboratory) are listed below for reference.  ? ?Imaging Studies: ?Pelvis Portable ? ?Result Date: 10/20/2021 ?CLINICAL DATA:  Postop left hip hemiarthroplasty. EXAM: PORTABLE PELVIS 1-2 VIEWS COMPARISON:  10/19/2021.  09/19/2020. FINDINGS: New left proximal femur hemiarthroplasty appears well seated and aligned. No acute fracture or evidence of an operative complication. Parallel linear areas of sclerosis in the proximal right femur consistent with tracks from prior screws, stable. Right hip joint, SI joints and symphysis pubis are normally aligned. Postoperative soft tissue edema and air adjacent to the left hip/proximal femur. IMPRESSION: 1. Well-positioned left hip hemiarthroplasty. 2. No acute fracture or evidence of an operative complication. Electronically Signed   By: Lajean Manes M.D.   On: 10/20/2021 12:19  ? ?DG HIP UNILAT WITH PELVIS 2-3 VIEWS LEFT ? ?Result Date: 10/20/2021 ?CLINICAL DATA:  Left hip hemiarthroplasty. EXAM: DG HIP (WITH OR  WITHOUT PELVIS) 2-3V LEFT COMPARISON:  10/19/2021. FINDINGS: Left hip hemiarthroplasty appears well seated and aligned. There are adjacent postoperative soft tissue changes. No acute fracture or evidence of

## 2021-10-23 NOTE — TOC Transition Note (Signed)
Transition of Care (TOC) - CM/SW Discharge Note ? ? ?Patient Details  ?Name: Rebekah King ?MRN: [id number] ?Date of Birth: [birth year] ? ?Transition of Care (TOC) CM/SW Contact:  ?Marybeth Dandy, LCSW ?Phone Number: ?10/23/2021, 10:23 AM ? ? ?Clinical Narrative:    ?Met with pt and spouse yesterday and today and confirming need for HHPT - no agency preference - able to secure coverage with [facility].  MD/pt/spouse aware there will be slight delay in start of services with first visits anticipated weekend.  No DME needs.  No further TOC needs. ? ? ?Final next level of care: [place] ?Barriers to Discharge: No Barriers Identified ? ? ?Patient Goals and CMS Choice ?Patient states their goals for this hospitalization and ongoing recovery are:: return home ?  ?  ? ?Discharge Placement ?  ?           ?  ?  ?  ?  ? ?Discharge Plan and Services ?  ?  ?           ?DME Arranged: N/A ?DME Agency: NA ?  ?  ?  ?HH Arranged: PT, OT ?[facility] [place] Agency: [facility] ?Date HH Agency Contacted: 10/23/21 ?Time [place]: 0930 ?Representative spoke with at [place]: Marjory Lies ? ?Social Determinants of Health (SDOH) Interventions ?  ? ? ?Readmission Risk Interventions ?   ? View : No data to display.  ?  ?  ?  ? ? ? ? ? ?

## 2021-10-23 NOTE — Discharge Instructions (Signed)
INSTRUCTIONS  ? ?Remove items at home which could result in a fall. This includes throw rugs or furniture in walking pathways ?ICE to the affected joint every three hours while awake for 30 minutes at a time, for at least the first 3-5 days, and then as needed for pain and swelling.  Continue to use ice for pain and swelling. You may notice swelling that will progress down to the foot and ankle.  This is normal after surgery.  Elevate your leg when you are not up walking on it.   ?Continue to use the breathing machine you got in the hospital (incentive spirometer) which will help keep your temperature down.  It is common for your temperature to cycle up and down following surgery, especially at night when you are not up moving around and exerting yourself.  The breathing machine keeps your lungs expanded and your temperature down. ? ? ?DIET:  As you were doing prior to hospitalization, we recommend a well-balanced diet. ? ?DRESSING / WOUND CARE / SHOWERING ? ?You may change your dressing 3-5 days after surgery.  Then change the dressing every day with sterile gauze.  Please use good hand washing techniques before changing the dressing.  Do not use any lotions or creams on the incision until instructed by your surgeon. ? ?ACTIVITY ? ?Increase activity slowly as tolerated, but follow the weight bearing instructions below.   ?No driving for 6 weeks or until further direction given by your physician.  You cannot drive while taking narcotics.  ?No lifting or carrying greater than 10 lbs. until further directed by your surgeon. ?Avoid periods of inactivity such as sitting longer than an hour when not asleep. This helps prevent blood clots.  ?You may return to work once you are authorized by your doctor.  ? ? ? ?WEIGHT BEARING  ? ?Weight bearing as tolerated with assist device (walker, cane, etc) as directed, use it as long as suggested by your surgeon or therapist, typically at least 4-6 weeks. ? ? ?EXERCISES ? ?Results  after joint replacement surgery are often greatly improved when you follow the exercise, range of motion and muscle strengthening exercises prescribed by your doctor. Safety measures are also important to protect the joint from further injury. Any time any of these exercises cause you to have increased pain or swelling, decrease what you are doing until you are comfortable again and then slowly increase them. If you have problems or questions, call your caregiver or physical therapist for advice.  ? ?Rehabilitation is important following a joint replacement. After just a few days of immobilization, the muscles of the leg can become weakened and shrink (atrophy).  These exercises are designed to build up the tone and strength of the thigh and leg muscles and to improve motion. Often times heat used for twenty to thirty minutes before working out will loosen up your tissues and help with improving the range of motion but do not use heat for the first two weeks following surgery (sometimes heat can increase post-operative swelling).  ? ?These exercises can be done on a training (exercise) mat, on the floor, on a table or on a bed. Use whatever works the best and is most comfortable for you.    Use music or television while you are exercising so that the exercises are a pleasant break in your day. This will make your life better with the exercises acting as a break in your routine that you can look forward to.  Perform all exercises about fifteen times, three times per day or as directed.  You should exercise both the operative leg and the other leg as well. ? ?Exercises include: ?  ?Quad Sets - Tighten up the muscle on the front of the thigh (Quad) and hold for 5-10 seconds.   ?Straight Leg Raises - With your knee straight (if you were given a brace, keep it on), lift the leg to 60 degrees, hold for 3 seconds, and slowly lower the leg.  Perform this exercise against resistance later as your leg gets stronger.  ?Leg  Slides: Lying on your back, slowly slide your foot toward your buttocks, bending your knee up off the floor (only go as far as is comfortable). Then slowly slide your foot back down until your leg is flat on the floor again.  ?Angel Wings: Lying on your back spread your legs to the side as far apart as you can without causing discomfort.  ?Hamstring Strength:  Lying on your back, push your heel against the floor with your leg straight by tightening up the muscles of your buttocks.  Repeat, but this time bend your knee to a comfortable angle, and push your heel against the floor.  You may put a pillow under the heel to make it more comfortable if necessary.  ? ?A rehabilitation program following joint replacement surgery can speed recovery and prevent re-injury in the future due to weakened muscles. Contact your doctor or a physical therapist for more information on knee rehabilitation.  ? ? ?CONSTIPATION ? ?Constipation is defined medically as fewer than three stools per week and severe constipation as less than one stool per week.  Even if you have a regular bowel pattern at home, your normal regimen is likely to be disrupted due to multiple reasons following surgery.  Combination of anesthesia, postoperative narcotics, change in appetite and fluid intake all can affect your bowels.  ? ?YOU MUST use at least one of the following options; they are listed in order of increasing strength to get the job done.  They are all available over the counter, and you may need to use some, POSSIBLY even all of these options:   ? ?Drink plenty of fluids (prune juice may be helpful) and high fiber foods ?Colace 100 mg by mouth twice a day  ?Senokot for constipation as directed and as needed Dulcolax (bisacodyl), take with full glass of water  ?Miralax (polyethylene glycol) once or twice a day as needed. ? ?If you have tried all these things and are unable to have a bowel movement in the first 3-4 days after surgery call either  your surgeon or your primary doctor.   ? ?If you experience loose stools or diarrhea, hold the medications until you stool forms back up.  If your symptoms do not get better within 1 week or if they get worse, check with your doctor.  If you experience "the worst abdominal pain ever" or develop nausea or vomiting, please contact the office immediately for further recommendations for treatment. ? ? ?ITCHING:  If you experience itching with your medications, try taking only a single pain pill, or even half a pain pill at a time.  You can also use Benadryl over the counter for itching or also to help with sleep.  ? ?TED HOSE STOCKINGS:  Use stockings on both legs until for at least 2 weeks or as directed by physician office. They may be removed at night for sleeping. ? ?MEDICATIONS:  See  your medication summary on the ?After Visit Summary? that nursing will review with you.  You may have some home medications which will be placed on hold until you complete the course of blood thinner medication.  It is important for you to complete the blood thinner medication as prescribed. ? ?PRECAUTIONS:  If you experience chest pain or shortness of breath - call 911 immediately for transfer to the hospital emergency department.  ? ?If you develop a fever greater that 101 F, purulent drainage from wound, increased redness or drainage from wound, foul odor from the wound/dressing, or calf pain - CONTACT YOUR SURGEON.   ?                                                ?FOLLOW-UP APPOINTMENTS:  If you do not already have a post-op appointment, please call the office for an appointment to be seen by your surgeon.  Guidelines for how soon to be seen are listed in your ?After Visit Summary?, but are typically between 1-4 weeks after surgery. ? ?OTHER INSTRUCTIONS:  ? ?Knee Replacement:  Do not place pillow under knee, focus on keeping the knee straight while resting. CPM instructions: 0-90 degrees, 2 hours in the morning, 2 hours in the  afternoon, and 2 hours in the evening. Place foam block, curve side up under heel at all times except when in CPM or when walking.  DO NOT modify, tear, cut, or change the foam block in any way. ? ?POST-OPER

## 2021-10-23 NOTE — Plan of Care (Signed)
?  Problem: Activity: ?Goal: Risk for activity intolerance will decrease ?Outcome: Progressing ?  ?Problem: Safety: ?Goal: Ability to remain free from injury will improve ?10/23/2021 0229 by Abagail Kitchens, RN ?Outcome: Progressing ?10/23/2021 0228 by Abagail Kitchens, RN ?Outcome: Progressing ?  ?Problem: Pain Managment: ?Goal: General experience of comfort will improve ?Outcome: Progressing ?  ?Problem: Education: ?Goal: Understanding of discharge needs will improve ?Outcome: Progressing ?  ?

## 2021-10-23 NOTE — Progress Notes (Signed)
Subjective: ?3 Days Post-Op Procedure(s) (LRB): ?ARTHROPLASTY BIPOLAR HIP (HEMIARTHROPLASTY) (Left) ?Patient reports pain as mild and dizziness/lightheadedness is gone .   ? ?Objective: ?Vital signs in last 24 hours: ?Temp:  [97.9 ?F (36.6 ?C)-99.1 ?F (37.3 ?C)] 97.9 ?F (36.6 ?C) (03/28 3903) ?Pulse Rate:  [81-94] 81 (03/28 0634) ?Resp:  [16-18] 16 (03/28 0092) ?BP: (125-179)/(57-75) 153/70 (03/28 3300) ?SpO2:  [95 %-99 %] 96 % (03/28 0634) ? ?Intake/Output from previous day: ?03/27 0701 - 03/28 0700 ?In: 2128.9 [P.O.:1200; I.V.:482.9; Blood:446] ?Out: 1500 [Urine:1500] ?Intake/Output this shift: ?No intake/output data recorded. ? ?Recent Labs  ?  10/21/21 ?0308 10/22/21 ?0315 10/23/21 ?0248  ?HGB 8.5* 8.2* 9.6*  ? ?Recent Labs  ?  10/22/21 ?0315 10/23/21 ?0248  ?WBC 8.4 7.4  ?RBC 2.43* 2.92*  ?HCT 23.6* 27.5*  ?PLT 163 173  ? ?Recent Labs  ?  10/21/21 ?0308 10/22/21 ?0315  ?NA 135 134*  ?K 3.7 3.5  ?CL 108 107  ?CO2 19* 21*  ?BUN 11 11  ?CREATININE 0.64 0.57  ?GLUCOSE 106* 104*  ?CALCIUM 8.1* 8.1*  ? ?No results for input(s): LABPT, INR in the last 72 hours. ? ?Neurovascular intact ?Sensation intact distally ?Intact pulses distally ?Dorsiflexion/Plantar flexion intact ?Incision: dressing C/D/I ? ? ?Assessment/Plan: ?3 Days Post-Op Procedure(s) (LRB): ?ARTHROPLASTY BIPOLAR HIP (HEMIARTHROPLASTY) (Left) ?Up with therapy ?WBAT LLE ?Pain control as ordered ?Asa/plavix dvt proph ?Likely d/c home today per medicine if she passes PT and no orthostatic hypotension. ?F/u dr caffrey in 2 weeks ? ? ? ? ?Rebekah King ?10/23/2021, 8:45 AM ? ?

## 2021-10-24 DIAGNOSIS — H401190 Primary open-angle glaucoma, unspecified eye, stage unspecified: Secondary | ICD-10-CM | POA: Diagnosis not present

## 2021-10-24 DIAGNOSIS — Z9181 History of falling: Secondary | ICD-10-CM | POA: Diagnosis not present

## 2021-10-24 DIAGNOSIS — F02A0 Dementia in other diseases classified elsewhere, mild, without behavioral disturbance, psychotic disturbance, mood disturbance, and anxiety: Secondary | ICD-10-CM | POA: Diagnosis not present

## 2021-10-24 DIAGNOSIS — E039 Hypothyroidism, unspecified: Secondary | ICD-10-CM | POA: Diagnosis not present

## 2021-10-24 DIAGNOSIS — Z87440 Personal history of urinary (tract) infections: Secondary | ICD-10-CM | POA: Diagnosis not present

## 2021-10-24 DIAGNOSIS — H903 Sensorineural hearing loss, bilateral: Secondary | ICD-10-CM | POA: Diagnosis not present

## 2021-10-24 DIAGNOSIS — H401313 Pigmentary glaucoma, right eye, severe stage: Secondary | ICD-10-CM | POA: Diagnosis not present

## 2021-10-24 DIAGNOSIS — K219 Gastro-esophageal reflux disease without esophagitis: Secondary | ICD-10-CM | POA: Diagnosis not present

## 2021-10-24 DIAGNOSIS — H35371 Puckering of macula, right eye: Secondary | ICD-10-CM | POA: Diagnosis not present

## 2021-10-24 DIAGNOSIS — M80052D Age-related osteoporosis with current pathological fracture, left femur, subsequent encounter for fracture with routine healing: Secondary | ICD-10-CM | POA: Diagnosis not present

## 2021-10-24 DIAGNOSIS — Z7902 Long term (current) use of antithrombotics/antiplatelets: Secondary | ICD-10-CM | POA: Diagnosis not present

## 2021-10-24 DIAGNOSIS — Z96611 Presence of right artificial shoulder joint: Secondary | ICD-10-CM | POA: Diagnosis not present

## 2021-10-24 DIAGNOSIS — I10 Essential (primary) hypertension: Secondary | ICD-10-CM | POA: Diagnosis not present

## 2021-10-24 DIAGNOSIS — G43109 Migraine with aura, not intractable, without status migrainosus: Secondary | ICD-10-CM | POA: Diagnosis not present

## 2021-10-24 DIAGNOSIS — Z7982 Long term (current) use of aspirin: Secondary | ICD-10-CM | POA: Diagnosis not present

## 2021-10-24 DIAGNOSIS — M1710 Unilateral primary osteoarthritis, unspecified knee: Secondary | ICD-10-CM | POA: Diagnosis not present

## 2021-10-24 DIAGNOSIS — E7800 Pure hypercholesterolemia, unspecified: Secondary | ICD-10-CM | POA: Diagnosis not present

## 2021-10-24 DIAGNOSIS — Z96653 Presence of artificial knee joint, bilateral: Secondary | ICD-10-CM | POA: Diagnosis not present

## 2021-10-24 DIAGNOSIS — Z8673 Personal history of transient ischemic attack (TIA), and cerebral infarction without residual deficits: Secondary | ICD-10-CM | POA: Diagnosis not present

## 2021-10-25 DIAGNOSIS — I10 Essential (primary) hypertension: Secondary | ICD-10-CM | POA: Diagnosis not present

## 2021-10-25 DIAGNOSIS — G43109 Migraine with aura, not intractable, without status migrainosus: Secondary | ICD-10-CM | POA: Diagnosis not present

## 2021-10-25 DIAGNOSIS — H35371 Puckering of macula, right eye: Secondary | ICD-10-CM | POA: Diagnosis not present

## 2021-10-25 DIAGNOSIS — M80052D Age-related osteoporosis with current pathological fracture, left femur, subsequent encounter for fracture with routine healing: Secondary | ICD-10-CM | POA: Diagnosis not present

## 2021-10-25 DIAGNOSIS — F02A0 Dementia in other diseases classified elsewhere, mild, without behavioral disturbance, psychotic disturbance, mood disturbance, and anxiety: Secondary | ICD-10-CM | POA: Diagnosis not present

## 2021-10-25 DIAGNOSIS — Z7982 Long term (current) use of aspirin: Secondary | ICD-10-CM | POA: Diagnosis not present

## 2021-10-25 DIAGNOSIS — H401190 Primary open-angle glaucoma, unspecified eye, stage unspecified: Secondary | ICD-10-CM | POA: Diagnosis not present

## 2021-10-25 DIAGNOSIS — Z87440 Personal history of urinary (tract) infections: Secondary | ICD-10-CM | POA: Diagnosis not present

## 2021-10-25 DIAGNOSIS — Z9181 History of falling: Secondary | ICD-10-CM | POA: Diagnosis not present

## 2021-10-25 DIAGNOSIS — H401313 Pigmentary glaucoma, right eye, severe stage: Secondary | ICD-10-CM | POA: Diagnosis not present

## 2021-10-25 DIAGNOSIS — M1710 Unilateral primary osteoarthritis, unspecified knee: Secondary | ICD-10-CM | POA: Diagnosis not present

## 2021-10-25 DIAGNOSIS — E039 Hypothyroidism, unspecified: Secondary | ICD-10-CM | POA: Diagnosis not present

## 2021-10-25 DIAGNOSIS — Z7902 Long term (current) use of antithrombotics/antiplatelets: Secondary | ICD-10-CM | POA: Diagnosis not present

## 2021-10-25 DIAGNOSIS — Z8673 Personal history of transient ischemic attack (TIA), and cerebral infarction without residual deficits: Secondary | ICD-10-CM | POA: Diagnosis not present

## 2021-10-25 DIAGNOSIS — E7800 Pure hypercholesterolemia, unspecified: Secondary | ICD-10-CM | POA: Diagnosis not present

## 2021-10-25 DIAGNOSIS — Z96653 Presence of artificial knee joint, bilateral: Secondary | ICD-10-CM | POA: Diagnosis not present

## 2021-10-25 DIAGNOSIS — H903 Sensorineural hearing loss, bilateral: Secondary | ICD-10-CM | POA: Diagnosis not present

## 2021-10-25 DIAGNOSIS — K219 Gastro-esophageal reflux disease without esophagitis: Secondary | ICD-10-CM | POA: Diagnosis not present

## 2021-10-25 DIAGNOSIS — Z96611 Presence of right artificial shoulder joint: Secondary | ICD-10-CM | POA: Diagnosis not present

## 2021-10-27 DIAGNOSIS — H903 Sensorineural hearing loss, bilateral: Secondary | ICD-10-CM | POA: Diagnosis not present

## 2021-10-27 DIAGNOSIS — Z87440 Personal history of urinary (tract) infections: Secondary | ICD-10-CM | POA: Diagnosis not present

## 2021-10-27 DIAGNOSIS — E7800 Pure hypercholesterolemia, unspecified: Secondary | ICD-10-CM | POA: Diagnosis not present

## 2021-10-27 DIAGNOSIS — Z9181 History of falling: Secondary | ICD-10-CM | POA: Diagnosis not present

## 2021-10-27 DIAGNOSIS — H401190 Primary open-angle glaucoma, unspecified eye, stage unspecified: Secondary | ICD-10-CM | POA: Diagnosis not present

## 2021-10-27 DIAGNOSIS — M1710 Unilateral primary osteoarthritis, unspecified knee: Secondary | ICD-10-CM | POA: Diagnosis not present

## 2021-10-27 DIAGNOSIS — Z8673 Personal history of transient ischemic attack (TIA), and cerebral infarction without residual deficits: Secondary | ICD-10-CM | POA: Diagnosis not present

## 2021-10-27 DIAGNOSIS — E039 Hypothyroidism, unspecified: Secondary | ICD-10-CM | POA: Diagnosis not present

## 2021-10-27 DIAGNOSIS — Z7902 Long term (current) use of antithrombotics/antiplatelets: Secondary | ICD-10-CM | POA: Diagnosis not present

## 2021-10-27 DIAGNOSIS — Z7982 Long term (current) use of aspirin: Secondary | ICD-10-CM | POA: Diagnosis not present

## 2021-10-27 DIAGNOSIS — K219 Gastro-esophageal reflux disease without esophagitis: Secondary | ICD-10-CM | POA: Diagnosis not present

## 2021-10-27 DIAGNOSIS — G43109 Migraine with aura, not intractable, without status migrainosus: Secondary | ICD-10-CM | POA: Diagnosis not present

## 2021-10-27 DIAGNOSIS — H35371 Puckering of macula, right eye: Secondary | ICD-10-CM | POA: Diagnosis not present

## 2021-10-27 DIAGNOSIS — M80052D Age-related osteoporosis with current pathological fracture, left femur, subsequent encounter for fracture with routine healing: Secondary | ICD-10-CM | POA: Diagnosis not present

## 2021-10-27 DIAGNOSIS — I10 Essential (primary) hypertension: Secondary | ICD-10-CM | POA: Diagnosis not present

## 2021-10-27 DIAGNOSIS — H401313 Pigmentary glaucoma, right eye, severe stage: Secondary | ICD-10-CM | POA: Diagnosis not present

## 2021-10-27 DIAGNOSIS — F02A0 Dementia in other diseases classified elsewhere, mild, without behavioral disturbance, psychotic disturbance, mood disturbance, and anxiety: Secondary | ICD-10-CM | POA: Diagnosis not present

## 2021-10-27 DIAGNOSIS — Z96653 Presence of artificial knee joint, bilateral: Secondary | ICD-10-CM | POA: Diagnosis not present

## 2021-10-27 DIAGNOSIS — Z96611 Presence of right artificial shoulder joint: Secondary | ICD-10-CM | POA: Diagnosis not present

## 2021-10-29 DIAGNOSIS — G43109 Migraine with aura, not intractable, without status migrainosus: Secondary | ICD-10-CM | POA: Diagnosis not present

## 2021-10-29 DIAGNOSIS — Z9181 History of falling: Secondary | ICD-10-CM | POA: Diagnosis not present

## 2021-10-29 DIAGNOSIS — Z96653 Presence of artificial knee joint, bilateral: Secondary | ICD-10-CM | POA: Diagnosis not present

## 2021-10-29 DIAGNOSIS — Z87440 Personal history of urinary (tract) infections: Secondary | ICD-10-CM | POA: Diagnosis not present

## 2021-10-29 DIAGNOSIS — Z8673 Personal history of transient ischemic attack (TIA), and cerebral infarction without residual deficits: Secondary | ICD-10-CM | POA: Diagnosis not present

## 2021-10-29 DIAGNOSIS — F02A0 Dementia in other diseases classified elsewhere, mild, without behavioral disturbance, psychotic disturbance, mood disturbance, and anxiety: Secondary | ICD-10-CM | POA: Diagnosis not present

## 2021-10-29 DIAGNOSIS — M80052D Age-related osteoporosis with current pathological fracture, left femur, subsequent encounter for fracture with routine healing: Secondary | ICD-10-CM | POA: Diagnosis not present

## 2021-10-29 DIAGNOSIS — H401190 Primary open-angle glaucoma, unspecified eye, stage unspecified: Secondary | ICD-10-CM | POA: Diagnosis not present

## 2021-10-29 DIAGNOSIS — M1710 Unilateral primary osteoarthritis, unspecified knee: Secondary | ICD-10-CM | POA: Diagnosis not present

## 2021-10-29 DIAGNOSIS — H903 Sensorineural hearing loss, bilateral: Secondary | ICD-10-CM | POA: Diagnosis not present

## 2021-10-29 DIAGNOSIS — H35371 Puckering of macula, right eye: Secondary | ICD-10-CM | POA: Diagnosis not present

## 2021-10-29 DIAGNOSIS — Z7982 Long term (current) use of aspirin: Secondary | ICD-10-CM | POA: Diagnosis not present

## 2021-10-29 DIAGNOSIS — Z96611 Presence of right artificial shoulder joint: Secondary | ICD-10-CM | POA: Diagnosis not present

## 2021-10-29 DIAGNOSIS — E7800 Pure hypercholesterolemia, unspecified: Secondary | ICD-10-CM | POA: Diagnosis not present

## 2021-10-29 DIAGNOSIS — I10 Essential (primary) hypertension: Secondary | ICD-10-CM | POA: Diagnosis not present

## 2021-10-29 DIAGNOSIS — Z7902 Long term (current) use of antithrombotics/antiplatelets: Secondary | ICD-10-CM | POA: Diagnosis not present

## 2021-10-29 DIAGNOSIS — E039 Hypothyroidism, unspecified: Secondary | ICD-10-CM | POA: Diagnosis not present

## 2021-10-29 DIAGNOSIS — K219 Gastro-esophageal reflux disease without esophagitis: Secondary | ICD-10-CM | POA: Diagnosis not present

## 2021-10-29 DIAGNOSIS — H401313 Pigmentary glaucoma, right eye, severe stage: Secondary | ICD-10-CM | POA: Diagnosis not present

## 2021-10-30 DIAGNOSIS — K219 Gastro-esophageal reflux disease without esophagitis: Secondary | ICD-10-CM | POA: Diagnosis not present

## 2021-10-30 DIAGNOSIS — Z7902 Long term (current) use of antithrombotics/antiplatelets: Secondary | ICD-10-CM | POA: Diagnosis not present

## 2021-10-30 DIAGNOSIS — G43109 Migraine with aura, not intractable, without status migrainosus: Secondary | ICD-10-CM | POA: Diagnosis not present

## 2021-10-30 DIAGNOSIS — E7800 Pure hypercholesterolemia, unspecified: Secondary | ICD-10-CM | POA: Diagnosis not present

## 2021-10-30 DIAGNOSIS — H903 Sensorineural hearing loss, bilateral: Secondary | ICD-10-CM | POA: Diagnosis not present

## 2021-10-30 DIAGNOSIS — Z9181 History of falling: Secondary | ICD-10-CM | POA: Diagnosis not present

## 2021-10-30 DIAGNOSIS — Z8673 Personal history of transient ischemic attack (TIA), and cerebral infarction without residual deficits: Secondary | ICD-10-CM | POA: Diagnosis not present

## 2021-10-30 DIAGNOSIS — Z87440 Personal history of urinary (tract) infections: Secondary | ICD-10-CM | POA: Diagnosis not present

## 2021-10-30 DIAGNOSIS — M1710 Unilateral primary osteoarthritis, unspecified knee: Secondary | ICD-10-CM | POA: Diagnosis not present

## 2021-10-30 DIAGNOSIS — H401313 Pigmentary glaucoma, right eye, severe stage: Secondary | ICD-10-CM | POA: Diagnosis not present

## 2021-10-30 DIAGNOSIS — H35371 Puckering of macula, right eye: Secondary | ICD-10-CM | POA: Diagnosis not present

## 2021-10-30 DIAGNOSIS — I10 Essential (primary) hypertension: Secondary | ICD-10-CM | POA: Diagnosis not present

## 2021-10-30 DIAGNOSIS — E039 Hypothyroidism, unspecified: Secondary | ICD-10-CM | POA: Diagnosis not present

## 2021-10-30 DIAGNOSIS — Z96611 Presence of right artificial shoulder joint: Secondary | ICD-10-CM | POA: Diagnosis not present

## 2021-10-30 DIAGNOSIS — M80052D Age-related osteoporosis with current pathological fracture, left femur, subsequent encounter for fracture with routine healing: Secondary | ICD-10-CM | POA: Diagnosis not present

## 2021-10-30 DIAGNOSIS — Z96653 Presence of artificial knee joint, bilateral: Secondary | ICD-10-CM | POA: Diagnosis not present

## 2021-10-30 DIAGNOSIS — H401190 Primary open-angle glaucoma, unspecified eye, stage unspecified: Secondary | ICD-10-CM | POA: Diagnosis not present

## 2021-10-30 DIAGNOSIS — F02A0 Dementia in other diseases classified elsewhere, mild, without behavioral disturbance, psychotic disturbance, mood disturbance, and anxiety: Secondary | ICD-10-CM | POA: Diagnosis not present

## 2021-10-30 DIAGNOSIS — Z7982 Long term (current) use of aspirin: Secondary | ICD-10-CM | POA: Diagnosis not present

## 2021-10-31 DIAGNOSIS — H35371 Puckering of macula, right eye: Secondary | ICD-10-CM | POA: Diagnosis not present

## 2021-10-31 DIAGNOSIS — Z96653 Presence of artificial knee joint, bilateral: Secondary | ICD-10-CM | POA: Diagnosis not present

## 2021-10-31 DIAGNOSIS — E7800 Pure hypercholesterolemia, unspecified: Secondary | ICD-10-CM | POA: Diagnosis not present

## 2021-10-31 DIAGNOSIS — H903 Sensorineural hearing loss, bilateral: Secondary | ICD-10-CM | POA: Diagnosis not present

## 2021-10-31 DIAGNOSIS — M80052D Age-related osteoporosis with current pathological fracture, left femur, subsequent encounter for fracture with routine healing: Secondary | ICD-10-CM | POA: Diagnosis not present

## 2021-10-31 DIAGNOSIS — M1710 Unilateral primary osteoarthritis, unspecified knee: Secondary | ICD-10-CM | POA: Diagnosis not present

## 2021-10-31 DIAGNOSIS — Z7902 Long term (current) use of antithrombotics/antiplatelets: Secondary | ICD-10-CM | POA: Diagnosis not present

## 2021-10-31 DIAGNOSIS — Z87440 Personal history of urinary (tract) infections: Secondary | ICD-10-CM | POA: Diagnosis not present

## 2021-10-31 DIAGNOSIS — Z7982 Long term (current) use of aspirin: Secondary | ICD-10-CM | POA: Diagnosis not present

## 2021-10-31 DIAGNOSIS — N390 Urinary tract infection, site not specified: Secondary | ICD-10-CM | POA: Diagnosis not present

## 2021-10-31 DIAGNOSIS — E039 Hypothyroidism, unspecified: Secondary | ICD-10-CM | POA: Diagnosis not present

## 2021-10-31 DIAGNOSIS — H401190 Primary open-angle glaucoma, unspecified eye, stage unspecified: Secondary | ICD-10-CM | POA: Diagnosis not present

## 2021-10-31 DIAGNOSIS — K219 Gastro-esophageal reflux disease without esophagitis: Secondary | ICD-10-CM | POA: Diagnosis not present

## 2021-10-31 DIAGNOSIS — G43109 Migraine with aura, not intractable, without status migrainosus: Secondary | ICD-10-CM | POA: Diagnosis not present

## 2021-10-31 DIAGNOSIS — I9589 Other hypotension: Secondary | ICD-10-CM | POA: Diagnosis not present

## 2021-10-31 DIAGNOSIS — Z9181 History of falling: Secondary | ICD-10-CM | POA: Diagnosis not present

## 2021-10-31 DIAGNOSIS — R2689 Other abnormalities of gait and mobility: Secondary | ICD-10-CM | POA: Diagnosis not present

## 2021-10-31 DIAGNOSIS — F02A0 Dementia in other diseases classified elsewhere, mild, without behavioral disturbance, psychotic disturbance, mood disturbance, and anxiety: Secondary | ICD-10-CM | POA: Diagnosis not present

## 2021-10-31 DIAGNOSIS — I10 Essential (primary) hypertension: Secondary | ICD-10-CM | POA: Diagnosis not present

## 2021-10-31 DIAGNOSIS — M6281 Muscle weakness (generalized): Secondary | ICD-10-CM | POA: Diagnosis not present

## 2021-10-31 DIAGNOSIS — Z8673 Personal history of transient ischemic attack (TIA), and cerebral infarction without residual deficits: Secondary | ICD-10-CM | POA: Diagnosis not present

## 2021-10-31 DIAGNOSIS — H401313 Pigmentary glaucoma, right eye, severe stage: Secondary | ICD-10-CM | POA: Diagnosis not present

## 2021-10-31 DIAGNOSIS — Z96611 Presence of right artificial shoulder joint: Secondary | ICD-10-CM | POA: Diagnosis not present

## 2021-11-03 DIAGNOSIS — R609 Edema, unspecified: Secondary | ICD-10-CM | POA: Diagnosis not present

## 2021-11-03 DIAGNOSIS — Z6821 Body mass index (BMI) 21.0-21.9, adult: Secondary | ICD-10-CM | POA: Diagnosis not present

## 2021-11-05 DIAGNOSIS — N390 Urinary tract infection, site not specified: Secondary | ICD-10-CM | POA: Diagnosis not present

## 2021-11-05 DIAGNOSIS — E039 Hypothyroidism, unspecified: Secondary | ICD-10-CM | POA: Diagnosis not present

## 2021-11-05 DIAGNOSIS — I10 Essential (primary) hypertension: Secondary | ICD-10-CM | POA: Diagnosis not present

## 2021-11-05 DIAGNOSIS — H401313 Pigmentary glaucoma, right eye, severe stage: Secondary | ICD-10-CM | POA: Diagnosis not present

## 2021-11-05 DIAGNOSIS — Z87440 Personal history of urinary (tract) infections: Secondary | ICD-10-CM | POA: Diagnosis not present

## 2021-11-05 DIAGNOSIS — Z96611 Presence of right artificial shoulder joint: Secondary | ICD-10-CM | POA: Diagnosis not present

## 2021-11-05 DIAGNOSIS — Z7902 Long term (current) use of antithrombotics/antiplatelets: Secondary | ICD-10-CM | POA: Diagnosis not present

## 2021-11-05 DIAGNOSIS — K219 Gastro-esophageal reflux disease without esophagitis: Secondary | ICD-10-CM | POA: Diagnosis not present

## 2021-11-05 DIAGNOSIS — I9589 Other hypotension: Secondary | ICD-10-CM | POA: Diagnosis not present

## 2021-11-05 DIAGNOSIS — M6281 Muscle weakness (generalized): Secondary | ICD-10-CM | POA: Diagnosis not present

## 2021-11-05 DIAGNOSIS — Z96653 Presence of artificial knee joint, bilateral: Secondary | ICD-10-CM | POA: Diagnosis not present

## 2021-11-05 DIAGNOSIS — H903 Sensorineural hearing loss, bilateral: Secondary | ICD-10-CM | POA: Diagnosis not present

## 2021-11-05 DIAGNOSIS — M80052D Age-related osteoporosis with current pathological fracture, left femur, subsequent encounter for fracture with routine healing: Secondary | ICD-10-CM | POA: Diagnosis not present

## 2021-11-05 DIAGNOSIS — F02A0 Dementia in other diseases classified elsewhere, mild, without behavioral disturbance, psychotic disturbance, mood disturbance, and anxiety: Secondary | ICD-10-CM | POA: Diagnosis not present

## 2021-11-05 DIAGNOSIS — E7800 Pure hypercholesterolemia, unspecified: Secondary | ICD-10-CM | POA: Diagnosis not present

## 2021-11-05 DIAGNOSIS — Z8673 Personal history of transient ischemic attack (TIA), and cerebral infarction without residual deficits: Secondary | ICD-10-CM | POA: Diagnosis not present

## 2021-11-05 DIAGNOSIS — G43109 Migraine with aura, not intractable, without status migrainosus: Secondary | ICD-10-CM | POA: Diagnosis not present

## 2021-11-05 DIAGNOSIS — H401190 Primary open-angle glaucoma, unspecified eye, stage unspecified: Secondary | ICD-10-CM | POA: Diagnosis not present

## 2021-11-05 DIAGNOSIS — Z7982 Long term (current) use of aspirin: Secondary | ICD-10-CM | POA: Diagnosis not present

## 2021-11-05 DIAGNOSIS — R2689 Other abnormalities of gait and mobility: Secondary | ICD-10-CM | POA: Diagnosis not present

## 2021-11-05 DIAGNOSIS — H35371 Puckering of macula, right eye: Secondary | ICD-10-CM | POA: Diagnosis not present

## 2021-11-05 DIAGNOSIS — Z9181 History of falling: Secondary | ICD-10-CM | POA: Diagnosis not present

## 2021-11-05 DIAGNOSIS — M1710 Unilateral primary osteoarthritis, unspecified knee: Secondary | ICD-10-CM | POA: Diagnosis not present

## 2021-11-07 DIAGNOSIS — H401190 Primary open-angle glaucoma, unspecified eye, stage unspecified: Secondary | ICD-10-CM | POA: Diagnosis not present

## 2021-11-07 DIAGNOSIS — I9589 Other hypotension: Secondary | ICD-10-CM | POA: Diagnosis not present

## 2021-11-07 DIAGNOSIS — M1710 Unilateral primary osteoarthritis, unspecified knee: Secondary | ICD-10-CM | POA: Diagnosis not present

## 2021-11-07 DIAGNOSIS — H401313 Pigmentary glaucoma, right eye, severe stage: Secondary | ICD-10-CM | POA: Diagnosis not present

## 2021-11-07 DIAGNOSIS — Z9181 History of falling: Secondary | ICD-10-CM | POA: Diagnosis not present

## 2021-11-07 DIAGNOSIS — H903 Sensorineural hearing loss, bilateral: Secondary | ICD-10-CM | POA: Diagnosis not present

## 2021-11-07 DIAGNOSIS — M6281 Muscle weakness (generalized): Secondary | ICD-10-CM | POA: Diagnosis not present

## 2021-11-07 DIAGNOSIS — G43109 Migraine with aura, not intractable, without status migrainosus: Secondary | ICD-10-CM | POA: Diagnosis not present

## 2021-11-07 DIAGNOSIS — M80052D Age-related osteoporosis with current pathological fracture, left femur, subsequent encounter for fracture with routine healing: Secondary | ICD-10-CM | POA: Diagnosis not present

## 2021-11-07 DIAGNOSIS — Z87440 Personal history of urinary (tract) infections: Secondary | ICD-10-CM | POA: Diagnosis not present

## 2021-11-07 DIAGNOSIS — Z7902 Long term (current) use of antithrombotics/antiplatelets: Secondary | ICD-10-CM | POA: Diagnosis not present

## 2021-11-07 DIAGNOSIS — R2689 Other abnormalities of gait and mobility: Secondary | ICD-10-CM | POA: Diagnosis not present

## 2021-11-07 DIAGNOSIS — Z8673 Personal history of transient ischemic attack (TIA), and cerebral infarction without residual deficits: Secondary | ICD-10-CM | POA: Diagnosis not present

## 2021-11-07 DIAGNOSIS — K219 Gastro-esophageal reflux disease without esophagitis: Secondary | ICD-10-CM | POA: Diagnosis not present

## 2021-11-07 DIAGNOSIS — Z7982 Long term (current) use of aspirin: Secondary | ICD-10-CM | POA: Diagnosis not present

## 2021-11-07 DIAGNOSIS — Z96611 Presence of right artificial shoulder joint: Secondary | ICD-10-CM | POA: Diagnosis not present

## 2021-11-07 DIAGNOSIS — F02A0 Dementia in other diseases classified elsewhere, mild, without behavioral disturbance, psychotic disturbance, mood disturbance, and anxiety: Secondary | ICD-10-CM | POA: Diagnosis not present

## 2021-11-07 DIAGNOSIS — H35371 Puckering of macula, right eye: Secondary | ICD-10-CM | POA: Diagnosis not present

## 2021-11-07 DIAGNOSIS — E039 Hypothyroidism, unspecified: Secondary | ICD-10-CM | POA: Diagnosis not present

## 2021-11-07 DIAGNOSIS — I10 Essential (primary) hypertension: Secondary | ICD-10-CM | POA: Diagnosis not present

## 2021-11-07 DIAGNOSIS — Z96653 Presence of artificial knee joint, bilateral: Secondary | ICD-10-CM | POA: Diagnosis not present

## 2021-11-07 DIAGNOSIS — E7800 Pure hypercholesterolemia, unspecified: Secondary | ICD-10-CM | POA: Diagnosis not present

## 2021-11-07 DIAGNOSIS — N390 Urinary tract infection, site not specified: Secondary | ICD-10-CM | POA: Diagnosis not present

## 2021-11-09 DIAGNOSIS — S72002D Fracture of unspecified part of neck of left femur, subsequent encounter for closed fracture with routine healing: Secondary | ICD-10-CM | POA: Diagnosis not present

## 2021-11-09 DIAGNOSIS — K2100 Gastro-esophageal reflux disease with esophagitis, without bleeding: Secondary | ICD-10-CM | POA: Diagnosis not present

## 2021-11-09 DIAGNOSIS — I10 Essential (primary) hypertension: Secondary | ICD-10-CM | POA: Diagnosis not present

## 2021-11-09 DIAGNOSIS — E7849 Other hyperlipidemia: Secondary | ICD-10-CM | POA: Diagnosis not present

## 2021-11-09 DIAGNOSIS — E039 Hypothyroidism, unspecified: Secondary | ICD-10-CM | POA: Diagnosis not present

## 2021-11-09 DIAGNOSIS — E7800 Pure hypercholesterolemia, unspecified: Secondary | ICD-10-CM | POA: Diagnosis not present

## 2021-11-09 DIAGNOSIS — E782 Mixed hyperlipidemia: Secondary | ICD-10-CM | POA: Diagnosis not present

## 2021-11-12 DIAGNOSIS — F02A0 Dementia in other diseases classified elsewhere, mild, without behavioral disturbance, psychotic disturbance, mood disturbance, and anxiety: Secondary | ICD-10-CM | POA: Diagnosis not present

## 2021-11-12 DIAGNOSIS — G43109 Migraine with aura, not intractable, without status migrainosus: Secondary | ICD-10-CM | POA: Diagnosis not present

## 2021-11-12 DIAGNOSIS — M80052D Age-related osteoporosis with current pathological fracture, left femur, subsequent encounter for fracture with routine healing: Secondary | ICD-10-CM | POA: Diagnosis not present

## 2021-11-12 DIAGNOSIS — N3090 Cystitis, unspecified without hematuria: Secondary | ICD-10-CM | POA: Diagnosis not present

## 2021-11-12 DIAGNOSIS — D649 Anemia, unspecified: Secondary | ICD-10-CM | POA: Diagnosis not present

## 2021-11-12 DIAGNOSIS — Z7902 Long term (current) use of antithrombotics/antiplatelets: Secondary | ICD-10-CM | POA: Diagnosis not present

## 2021-11-12 DIAGNOSIS — I10 Essential (primary) hypertension: Secondary | ICD-10-CM | POA: Diagnosis not present

## 2021-11-12 DIAGNOSIS — H401190 Primary open-angle glaucoma, unspecified eye, stage unspecified: Secondary | ICD-10-CM | POA: Diagnosis not present

## 2021-11-12 DIAGNOSIS — N183 Chronic kidney disease, stage 3 unspecified: Secondary | ICD-10-CM | POA: Diagnosis not present

## 2021-11-12 DIAGNOSIS — R609 Edema, unspecified: Secondary | ICD-10-CM | POA: Diagnosis not present

## 2021-11-12 DIAGNOSIS — H903 Sensorineural hearing loss, bilateral: Secondary | ICD-10-CM | POA: Diagnosis not present

## 2021-11-12 DIAGNOSIS — Z8673 Personal history of transient ischemic attack (TIA), and cerebral infarction without residual deficits: Secondary | ICD-10-CM | POA: Diagnosis not present

## 2021-11-12 DIAGNOSIS — H401313 Pigmentary glaucoma, right eye, severe stage: Secondary | ICD-10-CM | POA: Diagnosis not present

## 2021-11-12 DIAGNOSIS — Z96611 Presence of right artificial shoulder joint: Secondary | ICD-10-CM | POA: Diagnosis not present

## 2021-11-12 DIAGNOSIS — E039 Hypothyroidism, unspecified: Secondary | ICD-10-CM | POA: Diagnosis not present

## 2021-11-12 DIAGNOSIS — K219 Gastro-esophageal reflux disease without esophagitis: Secondary | ICD-10-CM | POA: Diagnosis not present

## 2021-11-12 DIAGNOSIS — E871 Hypo-osmolality and hyponatremia: Secondary | ICD-10-CM | POA: Diagnosis not present

## 2021-11-12 DIAGNOSIS — E7800 Pure hypercholesterolemia, unspecified: Secondary | ICD-10-CM | POA: Diagnosis not present

## 2021-11-12 DIAGNOSIS — G3184 Mild cognitive impairment, so stated: Secondary | ICD-10-CM | POA: Diagnosis not present

## 2021-11-12 DIAGNOSIS — H35371 Puckering of macula, right eye: Secondary | ICD-10-CM | POA: Diagnosis not present

## 2021-11-12 DIAGNOSIS — Z87440 Personal history of urinary (tract) infections: Secondary | ICD-10-CM | POA: Diagnosis not present

## 2021-11-12 DIAGNOSIS — M1710 Unilateral primary osteoarthritis, unspecified knee: Secondary | ICD-10-CM | POA: Diagnosis not present

## 2021-11-12 DIAGNOSIS — Z96653 Presence of artificial knee joint, bilateral: Secondary | ICD-10-CM | POA: Diagnosis not present

## 2021-11-12 DIAGNOSIS — Z7982 Long term (current) use of aspirin: Secondary | ICD-10-CM | POA: Diagnosis not present

## 2021-11-12 DIAGNOSIS — Z9181 History of falling: Secondary | ICD-10-CM | POA: Diagnosis not present

## 2021-11-15 DIAGNOSIS — Z87440 Personal history of urinary (tract) infections: Secondary | ICD-10-CM | POA: Diagnosis not present

## 2021-11-15 DIAGNOSIS — K219 Gastro-esophageal reflux disease without esophagitis: Secondary | ICD-10-CM | POA: Diagnosis not present

## 2021-11-15 DIAGNOSIS — M1710 Unilateral primary osteoarthritis, unspecified knee: Secondary | ICD-10-CM | POA: Diagnosis not present

## 2021-11-15 DIAGNOSIS — Z96653 Presence of artificial knee joint, bilateral: Secondary | ICD-10-CM | POA: Diagnosis not present

## 2021-11-15 DIAGNOSIS — H903 Sensorineural hearing loss, bilateral: Secondary | ICD-10-CM | POA: Diagnosis not present

## 2021-11-15 DIAGNOSIS — Z8673 Personal history of transient ischemic attack (TIA), and cerebral infarction without residual deficits: Secondary | ICD-10-CM | POA: Diagnosis not present

## 2021-11-15 DIAGNOSIS — Z9181 History of falling: Secondary | ICD-10-CM | POA: Diagnosis not present

## 2021-11-15 DIAGNOSIS — F02A0 Dementia in other diseases classified elsewhere, mild, without behavioral disturbance, psychotic disturbance, mood disturbance, and anxiety: Secondary | ICD-10-CM | POA: Diagnosis not present

## 2021-11-15 DIAGNOSIS — Z7902 Long term (current) use of antithrombotics/antiplatelets: Secondary | ICD-10-CM | POA: Diagnosis not present

## 2021-11-15 DIAGNOSIS — H401313 Pigmentary glaucoma, right eye, severe stage: Secondary | ICD-10-CM | POA: Diagnosis not present

## 2021-11-15 DIAGNOSIS — Z7982 Long term (current) use of aspirin: Secondary | ICD-10-CM | POA: Diagnosis not present

## 2021-11-15 DIAGNOSIS — E7800 Pure hypercholesterolemia, unspecified: Secondary | ICD-10-CM | POA: Diagnosis not present

## 2021-11-15 DIAGNOSIS — H401190 Primary open-angle glaucoma, unspecified eye, stage unspecified: Secondary | ICD-10-CM | POA: Diagnosis not present

## 2021-11-15 DIAGNOSIS — E039 Hypothyroidism, unspecified: Secondary | ICD-10-CM | POA: Diagnosis not present

## 2021-11-15 DIAGNOSIS — G43109 Migraine with aura, not intractable, without status migrainosus: Secondary | ICD-10-CM | POA: Diagnosis not present

## 2021-11-15 DIAGNOSIS — I10 Essential (primary) hypertension: Secondary | ICD-10-CM | POA: Diagnosis not present

## 2021-11-15 DIAGNOSIS — H35371 Puckering of macula, right eye: Secondary | ICD-10-CM | POA: Diagnosis not present

## 2021-11-15 DIAGNOSIS — M80052D Age-related osteoporosis with current pathological fracture, left femur, subsequent encounter for fracture with routine healing: Secondary | ICD-10-CM | POA: Diagnosis not present

## 2021-11-15 DIAGNOSIS — Z96611 Presence of right artificial shoulder joint: Secondary | ICD-10-CM | POA: Diagnosis not present

## 2021-11-20 ENCOUNTER — Ambulatory Visit (HOSPITAL_BASED_OUTPATIENT_CLINIC_OR_DEPARTMENT_OTHER): Payer: Medicare Other | Admitting: Cardiovascular Disease

## 2021-11-22 DIAGNOSIS — G43109 Migraine with aura, not intractable, without status migrainosus: Secondary | ICD-10-CM | POA: Diagnosis not present

## 2021-11-22 DIAGNOSIS — I10 Essential (primary) hypertension: Secondary | ICD-10-CM | POA: Diagnosis not present

## 2021-11-22 DIAGNOSIS — Z87440 Personal history of urinary (tract) infections: Secondary | ICD-10-CM | POA: Diagnosis not present

## 2021-11-22 DIAGNOSIS — F02A0 Dementia in other diseases classified elsewhere, mild, without behavioral disturbance, psychotic disturbance, mood disturbance, and anxiety: Secondary | ICD-10-CM | POA: Diagnosis not present

## 2021-11-22 DIAGNOSIS — Z8673 Personal history of transient ischemic attack (TIA), and cerebral infarction without residual deficits: Secondary | ICD-10-CM | POA: Diagnosis not present

## 2021-11-22 DIAGNOSIS — K219 Gastro-esophageal reflux disease without esophagitis: Secondary | ICD-10-CM | POA: Diagnosis not present

## 2021-11-22 DIAGNOSIS — H903 Sensorineural hearing loss, bilateral: Secondary | ICD-10-CM | POA: Diagnosis not present

## 2021-11-22 DIAGNOSIS — Z6820 Body mass index (BMI) 20.0-20.9, adult: Secondary | ICD-10-CM | POA: Diagnosis not present

## 2021-11-22 DIAGNOSIS — H401313 Pigmentary glaucoma, right eye, severe stage: Secondary | ICD-10-CM | POA: Diagnosis not present

## 2021-11-22 DIAGNOSIS — H401190 Primary open-angle glaucoma, unspecified eye, stage unspecified: Secondary | ICD-10-CM | POA: Diagnosis not present

## 2021-11-22 DIAGNOSIS — E871 Hypo-osmolality and hyponatremia: Secondary | ICD-10-CM | POA: Diagnosis not present

## 2021-11-22 DIAGNOSIS — Z9181 History of falling: Secondary | ICD-10-CM | POA: Diagnosis not present

## 2021-11-22 DIAGNOSIS — E7800 Pure hypercholesterolemia, unspecified: Secondary | ICD-10-CM | POA: Diagnosis not present

## 2021-11-22 DIAGNOSIS — Z96611 Presence of right artificial shoulder joint: Secondary | ICD-10-CM | POA: Diagnosis not present

## 2021-11-22 DIAGNOSIS — M1710 Unilateral primary osteoarthritis, unspecified knee: Secondary | ICD-10-CM | POA: Diagnosis not present

## 2021-11-22 DIAGNOSIS — E039 Hypothyroidism, unspecified: Secondary | ICD-10-CM | POA: Diagnosis not present

## 2021-11-22 DIAGNOSIS — M80052D Age-related osteoporosis with current pathological fracture, left femur, subsequent encounter for fracture with routine healing: Secondary | ICD-10-CM | POA: Diagnosis not present

## 2021-11-22 DIAGNOSIS — N183 Chronic kidney disease, stage 3 unspecified: Secondary | ICD-10-CM | POA: Diagnosis not present

## 2021-11-22 DIAGNOSIS — Z7902 Long term (current) use of antithrombotics/antiplatelets: Secondary | ICD-10-CM | POA: Diagnosis not present

## 2021-11-22 DIAGNOSIS — Z7982 Long term (current) use of aspirin: Secondary | ICD-10-CM | POA: Diagnosis not present

## 2021-11-22 DIAGNOSIS — M1712 Unilateral primary osteoarthritis, left knee: Secondary | ICD-10-CM | POA: Diagnosis not present

## 2021-11-22 DIAGNOSIS — Z96653 Presence of artificial knee joint, bilateral: Secondary | ICD-10-CM | POA: Diagnosis not present

## 2021-11-22 DIAGNOSIS — H35371 Puckering of macula, right eye: Secondary | ICD-10-CM | POA: Diagnosis not present

## 2021-11-26 DIAGNOSIS — Z96642 Presence of left artificial hip joint: Secondary | ICD-10-CM | POA: Diagnosis not present

## 2021-11-26 DIAGNOSIS — R2689 Other abnormalities of gait and mobility: Secondary | ICD-10-CM | POA: Diagnosis not present

## 2021-11-26 DIAGNOSIS — S72002D Fracture of unspecified part of neck of left femur, subsequent encounter for closed fracture with routine healing: Secondary | ICD-10-CM | POA: Diagnosis not present

## 2021-11-26 DIAGNOSIS — M6281 Muscle weakness (generalized): Secondary | ICD-10-CM | POA: Diagnosis not present

## 2021-11-29 ENCOUNTER — Encounter (INDEPENDENT_AMBULATORY_CARE_PROVIDER_SITE_OTHER): Payer: Self-pay | Admitting: Gastroenterology

## 2021-11-29 ENCOUNTER — Ambulatory Visit (INDEPENDENT_AMBULATORY_CARE_PROVIDER_SITE_OTHER): Payer: Medicare Other | Admitting: Gastroenterology

## 2021-11-29 VITALS — BP 133/73 | HR 74 | Temp 97.9°F | Ht 62.0 in | Wt 115.3 lb

## 2021-11-29 DIAGNOSIS — D649 Anemia, unspecified: Secondary | ICD-10-CM

## 2021-11-29 DIAGNOSIS — K582 Mixed irritable bowel syndrome: Secondary | ICD-10-CM | POA: Diagnosis not present

## 2021-11-29 NOTE — Patient Instructions (Addendum)
Start taking Miralax 1 capful every day for one week. If bowel movements do not improve, increase to 1 capful every 12 hours. If after two weeks there is no improvement, increase to 1 capful every 8 hours ? ?Make sure you are staying well hydrated, eating a diet high in fruits, veggies and whole grains. Kiwi and prunes are good for constipation. ? ?Be mindful that bentyl can sometimes lead to constipation in higher doses, please do not take more than twice per day and if you are not having abdominal discomfort/cramping, you can try holding off on using this. ? ?Can try over the counter mylicon for gas as well. ? ?I have provided the low FODMAP eating plan as it provides good foods and foods you may want to avoid with IBS ? ?Give me a call in about 1 week to let me know how symptoms are doing. ? ?I will check your iron levels today and we will further discuss colonoscopy once your hip is healed ? ?Follow up 3 months ? ?

## 2021-11-29 NOTE — Progress Notes (Deleted)
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 She states that she previously would have explosive BMs with a lot of abdominal grumbling and rolling. States that BMs would be small bits of stool, never a larger amount of stool at one time. Is taking bentyl '20mg'$  BID, previously on QID but PCP had her drop back down to BID. States that she is still having some gas. She is having maybe 6 BMs per day, though these are very small in volume. She recently broke her L hip, states she has been on some pain medication, though only taking 1/2 tablet of her pain medication at night. She is now taking 2 stool softeners per day. Has also used rectal suppositories x2 without much result. Reports she has not had a good BM in maybe 2 weeks, though last Saturday she had a somewhat larger BM, but no good volume stools since. Denies abdominal pain. She feels that she has a lot of gas. Denies rectal bleeding. She is currently on iron pills so stools are very dark, states she was started on these in mid March due to anemia. She states that her PCP checks her iron studies routinely and these have been normal even prior to being on supplemental iron. She states she had a history of anemia when she was younger, went through a period where it normalized and now having anemia again. Hgb dropped to 8.5 on 10/21/21, she had hip surgery on 10/20/21. States she saw a blood doctor in the past and no cause of her anemia was every discovered. She does endorse some gradual weight loss of about 20 pounds but this has been over the past few years. Appetite is decent. Denies early satiety. Is maintained on protonix BID which works well for her. Previously only had a cough from her reflux but this has resolved.  ? ?She has had a colonoscopy, maybe 25 years ago, normal. She is amenable to having this in the future once her hip is healed.  ? ?EGD never ? ?No crc or liver disease ?No etoh or tobacco ?No NSAIDs ? ?Obtain most recent labs from PCP ?

## 2021-11-29 NOTE — Progress Notes (Signed)
? ?Referring Provider: Manon Hilding, MD ?Primary Care Physician:  Manon Hilding, MD ?Primary GI Physician: new ? ?Chief Complaint  ?Patient presents with  ? Irritable Bowel Syndrome  ?  IBS. Having explosive gas, stomach rumbling, woke a couple times at night to have BM.   ? ?HPI:   ?Rebekah King is a 81 y.o. female with past medical history of hyperlipidemia, GERD, HTN,  ? ?Patient presenting today as a new patient for IBS. ? ?Recent labs with normal CMP, TSH 2.29. ? ?Patient states she has history of IBS. She states that she previously would have explosive BMs with a lot of abdominal grumbling and rolling. States that BMs would be small bits of stool, never a larger amount of stool at one time. Is taking bentyl '20mg'$  BID, previously on QID but PCP had her drop back down to BID. States that she is still having some gas. She is having maybe 6 BMs per day, though these are very small in volume. She recently broke her L hip, states she has been on some pain medication, though only taking 1/2 tablet of her pain medication at night. She is now taking 2 stool softeners per day. Has also used rectal suppositories x2 without much result. Reports she has not had a good BM in maybe 2 weeks, though last Saturday she had a somewhat larger BM, but no good volume stools since. Denies abdominal pain. She feels that she has a lot of gas. Denies rectal bleeding. Has had some darker stools though she was on iron pills that were started in march, not currently on these as she felt they could be causing her constipation. She states that her PCP checks her iron studies routinely and these have been normal even prior to being on supplemental iron. She states she had a history of anemia when she was younger, went through a period where it normalized and now having anemia again. Hgb dropped to 8.5 on 10/21/21, she had hip surgery on 10/20/21. States she saw a blood doctor in the past and no cause of her anemia was every discovered.  She does endorse some gradual weight loss of about 20 pounds but this has been over the past few years. Appetite is decent. Denies early satiety. Is maintained on protonix BID which works well for her. Previously only had a cough from her reflux but this has resolved.  ? ?NSAID [id number] ?Social hx:no etoh or tobacco ?Fam hx: no crc or liver disease ? ?Last Colonoscopy:maybe 25 years ago ? ?Last Endoscopy:never ? ?Recommendations:  ? ? ?Past Medical History:  ?Diagnosis Date  ? Acute postoperative anemia due to expected blood loss 10/22/2021  ? Arthritis   ? Complication of anesthesia   ? CLAUSTROPHOBIC- TROUBLE WITH ANYTHING OVER FACE  ? GERD (gastroesophageal reflux disease)   ? Glaucoma   ? Hypothyroidism   ? UTI (urinary tract infection)   ? ? ?Past Surgical History:  ?Procedure Laterality Date  ? ABDOMINAL HYSTERECTOMY    ? CATARACT EXTRACTION W/ INTRAOCULAR LENS  IMPLANT, BILATERAL    ? HERNIA REPAIR    ? HIP ARTHROPLASTY Left 10/20/2021  ? Procedure: ARTHROPLASTY BIPOLAR HIP (HEMIARTHROPLASTY);  Surgeon: Earlie Server, MD;  Location: [facility];  Service: Orthopedics;  Laterality: Left;  ? HIP FRACTURE SURGERY    ?  RIGHT    (2ND SURGERY TO REMOVE PINS)  ? REFRACTIVE SURGERY    ? RETINAL DETACHMENT SURGERY    ? RIGHT  ? 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 Procedure: TOTAL KNEE ARTHROPLASTY;  Surgeon: Earlie Server, MD;  Location: [facility];  Service: Orthopedics;  Laterality: Right;  ? TOTAL SHOULDER ARTHROPLASTY Right 11/25/2019  ? Procedure: TOTAL SHOULDER ARTHROPLASTY;  Surgeon: Justice Britain, MD;  Location: [facility];  Service: Orthopedics;  Laterality: Right;  129mn  ? ? ?Current Outpatient Medications  ?Medication Sig Dispense Refill  ? acetaminophen (TYLENOL) 325 MG tablet Take 1-2 tablets (325-650 mg total) by mouth every 6 (six) hours as needed for mild pain (pain score 1-3 or temp > 100.5). Total acetaminophen between this and your pain med (hydrocodone/acetaminophen), max 3000 mg daily. 60 tablet 0  ?  aspirin EC 81 MG tablet Take 81 mg by mouth daily. Swallow whole.    ? Calcium Carb-Cholecalciferol (CALCIUM 600+D3 PO) Take 1 tablet by mouth in the morning and at bedtime.    ? clopidogrel (PLAVIX) 75 MG tablet Take 1 tablet (75 mg total) by mouth daily. 30 tablet 11  ? dicyclomine (BENTYL) 20 MG tablet Take 10 mg by mouth 4 (four) times daily.    ? docusate sodium (COLACE) 100 MG capsule Take 1 capsule (100 mg total) by mouth 2 (two) times daily. 20 capsule 0  ? dorzolamide (TRUSOPT) 2 % ophthalmic solution Place 1 drop into both eyes 3 (three) times daily.     ? ferrous sulfate 325 (65 FE) MG tablet Take 1 tablet (325 mg total) by mouth 3 (three) times daily after meals. 30 tablet 3  ? hydrALAZINE (APRESOLINE) 10 MG tablet Take 10 mg by mouth 3 (three) times daily.    ? latanoprost (XALATAN) 0.005 % ophthalmic solution Place 1 drop into both eyes at bedtime.    ? levothyroxine (SYNTHROID, LEVOTHROID) 50 MCG tablet Take 50 mcg by mouth every evening.    ? Multiple Vitamin (MULTIVITAMIN WITH MINERALS) TABS tablet Take 1 tablet by mouth daily.    ? nitrofurantoin (MACRODANTIN) 50 MG capsule Take 1 capsule (50 mg total) by mouth at bedtime. Take 1 capsule (50 mg total) by mouth at bedtime. 30 capsule 11  ? pantoprazole (PROTONIX) 40 MG tablet Take 40 mg by mouth 2 (two) times daily.    ? Polyethyl Glycol-Propyl Glycol (SYSTANE OP) Apply to eye. 4 daily    ? Probiotic Product (PROBIOTIC DAILY PO) Take by mouth.    ? simvastatin (ZOCOR) 20 MG tablet Take 20 mg by mouth every evening.    ? HYDROcodone-acetaminophen (NORCO/VICODIN) 5-325 MG tablet Take 1 tablet by mouth every 4 (four) hours as needed for moderate pain. (Patient not taking: Reported on 11/29/2021) 40 tablet 0  ? losartan (COZAAR) 100 MG tablet Take 100 mg by mouth daily. (Patient not taking: Reported on 11/29/2021)    ? ?No current facility-administered medications for this visit.  ? ? ?Allergies as of 11/29/2021 - Review Complete 11/29/2021  ?Allergen  Reaction Noted  ? Brimonidine Other (See Comments) 03/07/2020  ? Timolol Other (See Comments) 03/07/2020  ? Beef-derived products  10/21/2021  ? Misc. sulfonamide containing compounds  06/27/2021  ? Amoxicillin-pot clavulanate Diarrhea 11/12/2010  ? Sulfa antibiotics Nausea Only and Rash 02/27/2016  ? ? ?No family history on file. ? ?Social History  ? ?Socioeconomic History  ? Marital status: Married  ?  Spouse name: Not on file  ? Number of children: Not on file  ? Years of education: Not on file  ? Highest education level: Not on file  ?Occupational History  ? Not on file  ?Tobacco Use  ?  Smoking status: Never  ?  Passive exposure: Never  ? Smokeless tobacco: Never  ?Vaping Use  ? Vaping Use: Never used  ?Substance and Sexual Activity  ? Alcohol use: No  ? Drug use: No  ? Sexual activity: Not on file  ?Other Topics Concern  ? Not on file  ?Social History Narrative  ? Not on file  ? ?Social Determinants of Health  ? ?Financial Resource Strain: Not on file  ?Food Insecurity: Not on file  ?Transportation Needs: Not on file  ?Physical Activity: Not on file  ?Stress: Not on file  ?Social Connections: Not on file  ? ?Review of systems ?General: negative for malaise, night sweats, fever, chills, weight los ?Neck: Negative for lumps, goiter, pain and significant neck swelling ?Resp: Negative for cough, wheezing, dyspnea at rest ?CV: Negative for chest pain, leg swelling, palpitations, orthopnea ?GI: denies melena, hematochezia, nausea, vomiting, diarrhea, dysphagia, odyonophagia, early satiety or unintentional weight loss. +constipation +flatulence ?MSK: Negative for joint pain or swelling, back pain, and muscle pain. ?Derm: Negative for itching or rash ?Psych: Denies depression, anxiety, memory loss, confusion. No homicidal or suicidal ideation.  ?Heme: Negative for prolonged bleeding, bruising easily, and swollen nodes. ?Endocrine: Negative for cold or heat intolerance, polyuria, polydipsia and goiter. ?Neuro: negative  for tremor, gait imbalance, syncope and seizures. ?The remainder of the review of systems is noncontributory. ? ?Physical Exam: ?BP 133/73 (BP Location: Right Arm, Patient Position: Sitting, Cuff Size: Norm

## 2021-11-30 LAB — IRON,TIBC AND FERRITIN PANEL
%SAT: 43 % (calc) (ref 16–45)
Ferritin: 172 ng/mL (ref 16–288)
Iron: 140 ug/dL (ref 45–160)
TIBC: 329 mcg/dL (calc) (ref 250–450)

## 2021-12-05 DIAGNOSIS — S72002D Fracture of unspecified part of neck of left femur, subsequent encounter for closed fracture with routine healing: Secondary | ICD-10-CM | POA: Diagnosis not present

## 2021-12-05 DIAGNOSIS — R2689 Other abnormalities of gait and mobility: Secondary | ICD-10-CM | POA: Diagnosis not present

## 2021-12-05 DIAGNOSIS — M6281 Muscle weakness (generalized): Secondary | ICD-10-CM | POA: Diagnosis not present

## 2021-12-05 DIAGNOSIS — Z96642 Presence of left artificial hip joint: Secondary | ICD-10-CM | POA: Diagnosis not present

## 2021-12-10 DIAGNOSIS — M6281 Muscle weakness (generalized): Secondary | ICD-10-CM | POA: Diagnosis not present

## 2021-12-10 DIAGNOSIS — S72002D Fracture of unspecified part of neck of left femur, subsequent encounter for closed fracture with routine healing: Secondary | ICD-10-CM | POA: Diagnosis not present

## 2021-12-10 DIAGNOSIS — Z96642 Presence of left artificial hip joint: Secondary | ICD-10-CM | POA: Diagnosis not present

## 2021-12-10 DIAGNOSIS — R2689 Other abnormalities of gait and mobility: Secondary | ICD-10-CM | POA: Diagnosis not present

## 2021-12-12 ENCOUNTER — Encounter: Payer: Self-pay | Admitting: Urology

## 2021-12-12 ENCOUNTER — Ambulatory Visit: Payer: Medicare Other | Admitting: Urology

## 2021-12-12 VITALS — BP 144/70 | HR 77

## 2021-12-12 DIAGNOSIS — N3021 Other chronic cystitis with hematuria: Secondary | ICD-10-CM

## 2021-12-12 LAB — URINALYSIS, ROUTINE W REFLEX MICROSCOPIC
Bilirubin, UA: NEGATIVE
Glucose, UA: NEGATIVE
Ketones, UA: NEGATIVE
Nitrite, UA: NEGATIVE
Protein,UA: NEGATIVE
RBC, UA: NEGATIVE
Specific Gravity, UA: 1.01 (ref 1.005–1.030)
Urobilinogen, Ur: 0.2 mg/dL (ref 0.2–1.0)
pH, UA: 5.5 (ref 5.0–7.5)

## 2021-12-12 LAB — MICROSCOPIC EXAMINATION
Bacteria, UA: NONE SEEN
RBC, Urine: NONE SEEN /hpf (ref 0–2)

## 2021-12-12 MED ORDER — CEFUROXIME AXETIL 250 MG PO TABS: 250.0000 mg | ORAL_TABLET | Freq: Two times a day (BID) | ORAL | 3 refills | Status: DC

## 2021-12-12 NOTE — Progress Notes (Signed)
12/12/2021 3:17 PM   Rebekah King 01/23/1941 [id number]  Referring provider: Manon Hilding, MD [address],  [place] [address]  Followup recurrent UTI   HPI: Rebekah King is a 81yo here for followup for recurrent UTI. She was placed on macrobid '50mg'$  last visit. She developed UTI symptoms 1 week ago and started ceftin '250mg'$  BID and her symptoms have improved. UA shows 11-30 WBCs but no RBCs or bacteria. She had uirnary urgency and frequency last week which has improved. NO gross hematuria.    PMH: Past Medical History:  Diagnosis Date   Acute postoperative anemia due to expected blood loss 10/22/2021   Arthritis    Complication of anesthesia    CLAUSTROPHOBIC- TROUBLE WITH ANYTHING OVER FACE   GERD (gastroesophageal reflux disease)    Glaucoma    Hypothyroidism    UTI (urinary tract infection)     Surgical History: Past Surgical History:  Procedure Laterality Date   ABDOMINAL HYSTERECTOMY     CATARACT EXTRACTION W/ INTRAOCULAR LENS  IMPLANT, BILATERAL     HERNIA REPAIR     HIP ARTHROPLASTY Left 10/20/2021   Procedure: ARTHROPLASTY BIPOLAR HIP (HEMIARTHROPLASTY);  Surgeon: Earlie Server, MD;  Location: [facility];  Service: Orthopedics;  Laterality: Left;   HIP FRACTURE SURGERY      RIGHT    (2ND SURGERY TO REMOVE PINS)   REFRACTIVE SURGERY     RETINAL DETACHMENT SURGERY     RIGHT   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  Sulfonamide Containing Compounds    Amoxicillin-pot Clavulanate Diarrhea   GI pain   Sulfa Antibiotics Nausea Only, Rash   Hands go numb        Medication List        Accurate as of Dec 12, 2021  3:17 PM. If you have any questions, ask your nurse or doctor.          acetaminophen 325 MG tablet Commonly known as: TYLENOL Take 1-2 tablets (325-650 mg total) by mouth every 6 (six) hours as needed for mild pain (pain score 1-3 or temp > 100.5). Total acetaminophen between this and your pain med (hydrocodone/acetaminophen), max 3000 mg daily.   aspirin EC 81 MG tablet Take 81 mg by mouth daily. Swallow whole.   CALCIUM 600+D3 PO Take 1 tablet by mouth in the morning and at bedtime.   clopidogrel 75 MG tablet Commonly known as: PLAVIX Take 1 tablet (75 mg total) by mouth daily.   dicyclomine 20 MG tablet Commonly known as: BENTYL Take 10 mg by mouth 4 (four) times daily.   dicyclomine 10 MG capsule Commonly known as: BENTYL Take 10 mg by mouth 4 (four) times daily.   docusate sodium 100 MG capsule Commonly known as: COLACE Take 1 capsule (100 mg total) by mouth 2 (two) times daily.   dorzolamide 2 % ophthalmic solution Commonly known as: TRUSOPT Place  1 drop into both eyes 3 (three) times daily.   ferrous sulfate 325 (65 FE) MG tablet Take 1 tablet (325 mg total) by mouth 3 (three) times daily after meals.   hydrALAZINE 10 MG tablet Commonly known as: APRESOLINE Take 10 mg by mouth 3 (three) times daily.   hydrochlorothiazide 12.5 MG tablet Commonly known as: HYDRODIURIL Take 12.5 mg by mouth daily.   HYDROcodone-acetaminophen 5-325 MG tablet Commonly known as: NORCO/VICODIN Take 1 tablet by mouth every 4 (four) hours as needed for moderate pain.   latanoprost 0.005 % ophthalmic solution Commonly known as: XALATAN Place 1 drop into both eyes at bedtime.   levothyroxine 50 MCG tablet Commonly known as: SYNTHROID Take 50 mcg by mouth every evening.    losartan 100 MG tablet Commonly known as: COZAAR Take 100 mg by mouth daily.   meloxicam 15 MG tablet Commonly known as: MOBIC Take 15 mg by mouth daily.   multivitamin with minerals Tabs tablet Take 1 tablet by mouth daily.   nitrofurantoin 50 MG capsule Commonly known as: MACRODANTIN Take 1 capsule (50 mg total) by mouth at bedtime. Take 1 capsule (50 mg total) by mouth at bedtime.   pantoprazole 40 MG tablet Commonly known as: PROTONIX Take 40 mg by mouth 2 (two) times daily.   PROBIOTIC DAILY PO Take by mouth.   simvastatin 20 MG tablet Commonly known as: ZOCOR Take 20 mg by mouth every evening.   SYSTANE OP Apply to eye. 4 daily        Allergies:  Allergies  Allergen Reactions   Brimonidine Other (See Comments)    Severe dry mouth   Timolol Other (See Comments)    Lightheaded   Beef-Derived Products     After being bitten by tick   Misc. Sulfonamide Containing Compounds    Amoxicillin-Pot Clavulanate Diarrhea    GI pain     Sulfa Antibiotics Nausea Only and Rash    Hands go numb    Family History: No family history on file.  Social History:  reports that she has never smoked. She has never been exposed to tobacco smoke. She has never used smokeless tobacco. She reports that she does not drink alcohol and does not use drugs.  ROS: All other review of systems were reviewed and are negative except what is noted above in HPI  Physical Exam: BP (!) 144/70   Pulse 77   Constitutional:  Alert and oriented, No acute distress. HEENT: [place] AT, moist mucus membranes.  Trachea midline, no masses. Cardiovascular: No clubbing, cyanosis, or edema. Respiratory: Normal respiratory effort, no increased work of breathing. GI: Abdomen is soft, nontender, nondistended, no abdominal masses GU: No CVA tenderness.  Lymph: No cervical or inguinal lymphadenopathy. Skin: No rashes, bruises or suspicious lesions. Neurologic: Grossly intact, no focal deficits, moving all  4 extremities. Psychiatric: Normal mood and affect.  Laboratory Data: Lab Results  Component Value Date   WBC 7.4 10/23/2021   HGB 9.6 (L) 10/23/2021   HCT 27.5 (L) 10/23/2021   MCV 94.2 10/23/2021   PLT 173 10/23/2021    Lab Results  Component Value Date   CREATININE 0.57 10/22/2021    No results found for: PSA  No results found for: TESTOSTERONE  Lab Results  Component Value Date   HGBA1C 5.4 09/03/2021    Urinalysis    Component Value Date/Time   COLORURINE AMBER (A) 03/25/2021 2139   APPEARANCEUR Clear 06/27/2021 1524   LABSPEC 1.009 03/25/2021 2139   PHURINE  5.0 03/25/2021 2139   GLUCOSEU Negative 06/27/2021 1524   HGBUR SMALL (A) 03/25/2021 2139   BILIRUBINUR Negative 06/27/2021 [place] 03/25/2021 2139   PROTEINUR Negative 06/27/2021 [place] 03/25/2021 2139   UROBILINOGEN negative (A) 09/01/2019 1100   NITRITE Negative 06/27/2021 1524   NITRITE POSITIVE (A) 03/25/2021 2139   LEUKOCYTESUR Negative 06/27/2021 [place] 03/25/2021 2139    Lab Results  Component Value Date   LABMICR Comment 06/27/2021   WBCUA 0-5 02/06/2021   LABEPIT None seen 02/06/2021   MUCUS Present 08/23/2020   BACTERIA RARE (A) 03/25/2021    Pertinent Imaging:  No results found for this or any previous visit.  No results found for this or any previous visit.  No results found for this or any previous visit.  No results found for this or any previous visit.  No results found for this or any previous visit.  No results found for this or any previous visit.  No results found for this or any previous visit.  No results found for this or any previous visit.   Assessment & Plan:    1. Chronic cystitis with hematuria -Urine for culture. Continue ceftin for 10 days total - Urinalysis, Routine w reflex microscopic   No follow-ups on file.  Nicolette Bang, MD  [facility] [place]

## 2021-12-12 NOTE — Patient Instructions (Signed)
Urinary Tract Infection, Adult  A urinary tract infection (UTI) is an infection of any part of the urinary tract. The urinary tract includes the kidneys, ureters, bladder, and urethra. These organs make, store, and get rid of urine in the body. An upper UTI affects the ureters and kidneys. A lower UTI affects the bladder and urethra. What are the causes? Most urinary tract infections are caused by bacteria in your genital area around your urethra, where urine leaves your body. These bacteria grow and cause inflammation of your urinary tract. What increases the risk? You are more likely to develop this condition if: You have a urinary catheter that stays in place. You are not able to control when you urinate or have a bowel movement (incontinence). You are female and you: Use a spermicide or diaphragm for birth control. Have low estrogen levels. Are pregnant. You have certain genes that increase your risk. You are sexually active. You take antibiotic medicines. You have a condition that causes your flow of urine to slow down, such as: An enlarged prostate, if you are female. Blockage in your urethra. A kidney stone. A nerve condition that affects your bladder control (neurogenic bladder). Not getting enough to drink, or not urinating often. You have certain medical conditions, such as: Diabetes. A weak disease-fighting system (immunesystem). Sickle cell disease. Gout. Spinal cord injury. What are the signs or symptoms? Symptoms of this condition include: Needing to urinate right away (urgency). Frequent urination. This may include small amounts of urine each time you urinate. Pain or burning with urination. Blood in the urine. Urine that smells bad or unusual. Trouble urinating. Cloudy urine. Vaginal discharge, if you are female. Pain in the abdomen or the lower back. You may also have: Vomiting or a decreased appetite. Confusion. Irritability or tiredness. A fever or  chills. Diarrhea. The first symptom in older adults may be confusion. In some cases, they may not have any symptoms until the infection has worsened. How is this diagnosed? This condition is diagnosed based on your medical history and a physical exam. You may also have other tests, including: Urine tests. Blood tests. Tests for STIs (sexually transmitted infections). If you have had more than one UTI, a cystoscopy or imaging studies may be done to determine the cause of the infections. How is this treated? Treatment for this condition includes: Antibiotic medicine. Over-the-counter medicines to treat discomfort. Drinking enough water to stay hydrated. If you have frequent infections or have other conditions such as a kidney stone, you may need to see a health care provider who specializes in the urinary tract (urologist). In rare cases, urinary tract infections can cause sepsis. Sepsis is a life-threatening condition that occurs when the body responds to an infection. Sepsis is treated in the hospital with IV antibiotics, fluids, and other medicines. Follow these instructions at home:  Medicines Take over-the-counter and prescription medicines only as told by your health care provider. If you were prescribed an antibiotic medicine, take it as told by your health care provider. Do not stop using the antibiotic even if you start to feel better. General instructions Make sure you: Empty your bladder often and completely. Do not hold urine for long periods of time. Empty your bladder after sex. Wipe from front to back after urinating or having a bowel movement if you are female. Use each tissue only one time when you wipe. Drink enough fluid to keep your urine pale yellow. Keep all follow-up visits. This is important. Contact a health   care provider if: Your symptoms do not get better after 1-2 days. Your symptoms go away and then return. Get help right away if: You have severe pain in  your back or your lower abdomen. You have a fever or chills. You have nausea or vomiting. Summary A urinary tract infection (UTI) is an infection of any part of the urinary tract, which includes the kidneys, ureters, bladder, and urethra. Most urinary tract infections are caused by bacteria in your genital area. Treatment for this condition often includes antibiotic medicines. If you were prescribed an antibiotic medicine, take it as told by your health care provider. Do not stop using the antibiotic even if you start to feel better. Keep all follow-up visits. This is important. This information is not intended to replace advice given to you by your health care provider. Make sure you discuss any questions you have with your health care provider. Document Revised: 02/25/2020 Document Reviewed: 02/25/2020 Elsevier Patient Education  2023 [employer]  

## 2021-12-14 DIAGNOSIS — S72002D Fracture of unspecified part of neck of left femur, subsequent encounter for closed fracture with routine healing: Secondary | ICD-10-CM | POA: Diagnosis not present

## 2021-12-14 LAB — URINE CULTURE

## 2021-12-17 DIAGNOSIS — Z6821 Body mass index (BMI) 21.0-21.9, adult: Secondary | ICD-10-CM | POA: Diagnosis not present

## 2021-12-17 DIAGNOSIS — R42 Dizziness and giddiness: Secondary | ICD-10-CM | POA: Diagnosis not present

## 2021-12-17 DIAGNOSIS — N183 Chronic kidney disease, stage 3 unspecified: Secondary | ICD-10-CM | POA: Diagnosis not present

## 2021-12-17 DIAGNOSIS — I10 Essential (primary) hypertension: Secondary | ICD-10-CM | POA: Diagnosis not present

## 2021-12-17 DIAGNOSIS — E039 Hypothyroidism, unspecified: Secondary | ICD-10-CM | POA: Diagnosis not present

## 2021-12-17 DIAGNOSIS — M1710 Unilateral primary osteoarthritis, unspecified knee: Secondary | ICD-10-CM | POA: Diagnosis not present

## 2021-12-18 ENCOUNTER — Telehealth: Payer: Self-pay

## 2021-12-18 NOTE — Telephone Encounter (Signed)
Patient called stating UTI symptoms worsen  Culture showed mixed growth  Offered appt to see MD tomorrow patient accepted.

## 2021-12-19 ENCOUNTER — Ambulatory Visit: Payer: Medicare Other | Admitting: Urology

## 2021-12-25 DIAGNOSIS — E871 Hypo-osmolality and hyponatremia: Secondary | ICD-10-CM | POA: Diagnosis not present

## 2021-12-25 DIAGNOSIS — D649 Anemia, unspecified: Secondary | ICD-10-CM | POA: Diagnosis not present

## 2021-12-26 DIAGNOSIS — Z96642 Presence of left artificial hip joint: Secondary | ICD-10-CM | POA: Diagnosis not present

## 2021-12-26 DIAGNOSIS — R2689 Other abnormalities of gait and mobility: Secondary | ICD-10-CM | POA: Diagnosis not present

## 2021-12-26 DIAGNOSIS — S72002D Fracture of unspecified part of neck of left femur, subsequent encounter for closed fracture with routine healing: Secondary | ICD-10-CM | POA: Diagnosis not present

## 2021-12-26 DIAGNOSIS — M6281 Muscle weakness (generalized): Secondary | ICD-10-CM | POA: Diagnosis not present

## 2021-12-28 DIAGNOSIS — Z96642 Presence of left artificial hip joint: Secondary | ICD-10-CM | POA: Diagnosis not present

## 2021-12-28 DIAGNOSIS — M6281 Muscle weakness (generalized): Secondary | ICD-10-CM | POA: Diagnosis not present

## 2021-12-28 DIAGNOSIS — S72002D Fracture of unspecified part of neck of left femur, subsequent encounter for closed fracture with routine healing: Secondary | ICD-10-CM | POA: Diagnosis not present

## 2021-12-28 DIAGNOSIS — R2689 Other abnormalities of gait and mobility: Secondary | ICD-10-CM | POA: Diagnosis not present

## 2021-12-31 DIAGNOSIS — M6281 Muscle weakness (generalized): Secondary | ICD-10-CM | POA: Diagnosis not present

## 2021-12-31 DIAGNOSIS — R2689 Other abnormalities of gait and mobility: Secondary | ICD-10-CM | POA: Diagnosis not present

## 2021-12-31 DIAGNOSIS — Z96642 Presence of left artificial hip joint: Secondary | ICD-10-CM | POA: Diagnosis not present

## 2021-12-31 DIAGNOSIS — S72002D Fracture of unspecified part of neck of left femur, subsequent encounter for closed fracture with routine healing: Secondary | ICD-10-CM | POA: Diagnosis not present

## 2022-01-02 ENCOUNTER — Other Ambulatory Visit: Payer: Self-pay | Admitting: Neurology

## 2022-01-02 DIAGNOSIS — S72002D Fracture of unspecified part of neck of left femur, subsequent encounter for closed fracture with routine healing: Secondary | ICD-10-CM | POA: Diagnosis not present

## 2022-01-02 DIAGNOSIS — M6281 Muscle weakness (generalized): Secondary | ICD-10-CM | POA: Diagnosis not present

## 2022-01-02 DIAGNOSIS — Z96642 Presence of left artificial hip joint: Secondary | ICD-10-CM | POA: Diagnosis not present

## 2022-01-02 DIAGNOSIS — R2689 Other abnormalities of gait and mobility: Secondary | ICD-10-CM | POA: Diagnosis not present

## 2022-01-07 DIAGNOSIS — S72002D Fracture of unspecified part of neck of left femur, subsequent encounter for closed fracture with routine healing: Secondary | ICD-10-CM | POA: Diagnosis not present

## 2022-01-07 DIAGNOSIS — R2689 Other abnormalities of gait and mobility: Secondary | ICD-10-CM | POA: Diagnosis not present

## 2022-01-07 DIAGNOSIS — Z96642 Presence of left artificial hip joint: Secondary | ICD-10-CM | POA: Diagnosis not present

## 2022-01-07 DIAGNOSIS — M6281 Muscle weakness (generalized): Secondary | ICD-10-CM | POA: Diagnosis not present

## 2022-01-09 DIAGNOSIS — Z96642 Presence of left artificial hip joint: Secondary | ICD-10-CM | POA: Diagnosis not present

## 2022-01-09 DIAGNOSIS — R2689 Other abnormalities of gait and mobility: Secondary | ICD-10-CM | POA: Diagnosis not present

## 2022-01-09 DIAGNOSIS — M6281 Muscle weakness (generalized): Secondary | ICD-10-CM | POA: Diagnosis not present

## 2022-01-09 DIAGNOSIS — S72002D Fracture of unspecified part of neck of left femur, subsequent encounter for closed fracture with routine healing: Secondary | ICD-10-CM | POA: Diagnosis not present

## 2022-01-11 DIAGNOSIS — D529 Folate deficiency anemia, unspecified: Secondary | ICD-10-CM | POA: Diagnosis not present

## 2022-01-11 DIAGNOSIS — N183 Chronic kidney disease, stage 3 unspecified: Secondary | ICD-10-CM | POA: Diagnosis not present

## 2022-01-11 DIAGNOSIS — K2100 Gastro-esophageal reflux disease with esophagitis, without bleeding: Secondary | ICD-10-CM | POA: Diagnosis not present

## 2022-01-11 DIAGNOSIS — E782 Mixed hyperlipidemia: Secondary | ICD-10-CM | POA: Diagnosis not present

## 2022-01-11 DIAGNOSIS — S72002D Fracture of unspecified part of neck of left femur, subsequent encounter for closed fracture with routine healing: Secondary | ICD-10-CM | POA: Diagnosis not present

## 2022-01-11 DIAGNOSIS — E7849 Other hyperlipidemia: Secondary | ICD-10-CM | POA: Diagnosis not present

## 2022-01-11 DIAGNOSIS — I10 Essential (primary) hypertension: Secondary | ICD-10-CM | POA: Diagnosis not present

## 2022-01-11 DIAGNOSIS — D649 Anemia, unspecified: Secondary | ICD-10-CM | POA: Diagnosis not present

## 2022-01-11 DIAGNOSIS — D519 Vitamin B12 deficiency anemia, unspecified: Secondary | ICD-10-CM | POA: Diagnosis not present

## 2022-01-14 DIAGNOSIS — S72002D Fracture of unspecified part of neck of left femur, subsequent encounter for closed fracture with routine healing: Secondary | ICD-10-CM | POA: Diagnosis not present

## 2022-01-14 DIAGNOSIS — M6281 Muscle weakness (generalized): Secondary | ICD-10-CM | POA: Diagnosis not present

## 2022-01-14 DIAGNOSIS — Z96642 Presence of left artificial hip joint: Secondary | ICD-10-CM | POA: Diagnosis not present

## 2022-01-14 DIAGNOSIS — R2689 Other abnormalities of gait and mobility: Secondary | ICD-10-CM | POA: Diagnosis not present

## 2022-01-16 DIAGNOSIS — N183 Chronic kidney disease, stage 3 unspecified: Secondary | ICD-10-CM | POA: Diagnosis not present

## 2022-01-16 DIAGNOSIS — G3184 Mild cognitive impairment, so stated: Secondary | ICD-10-CM | POA: Diagnosis not present

## 2022-01-16 DIAGNOSIS — R609 Edema, unspecified: Secondary | ICD-10-CM | POA: Diagnosis not present

## 2022-01-16 DIAGNOSIS — I10 Essential (primary) hypertension: Secondary | ICD-10-CM | POA: Diagnosis not present

## 2022-01-16 DIAGNOSIS — E7849 Other hyperlipidemia: Secondary | ICD-10-CM | POA: Diagnosis not present

## 2022-01-16 DIAGNOSIS — R2689 Other abnormalities of gait and mobility: Secondary | ICD-10-CM | POA: Diagnosis not present

## 2022-01-16 DIAGNOSIS — D649 Anemia, unspecified: Secondary | ICD-10-CM | POA: Diagnosis not present

## 2022-01-16 DIAGNOSIS — M419 Scoliosis, unspecified: Secondary | ICD-10-CM | POA: Diagnosis not present

## 2022-01-16 DIAGNOSIS — E871 Hypo-osmolality and hyponatremia: Secondary | ICD-10-CM | POA: Diagnosis not present

## 2022-01-16 DIAGNOSIS — S72002D Fracture of unspecified part of neck of left femur, subsequent encounter for closed fracture with routine healing: Secondary | ICD-10-CM | POA: Diagnosis not present

## 2022-01-16 DIAGNOSIS — N3090 Cystitis, unspecified without hematuria: Secondary | ICD-10-CM | POA: Diagnosis not present

## 2022-01-16 DIAGNOSIS — Z96642 Presence of left artificial hip joint: Secondary | ICD-10-CM | POA: Diagnosis not present

## 2022-01-16 DIAGNOSIS — R7301 Impaired fasting glucose: Secondary | ICD-10-CM | POA: Diagnosis not present

## 2022-01-16 DIAGNOSIS — E875 Hyperkalemia: Secondary | ICD-10-CM | POA: Diagnosis not present

## 2022-01-16 DIAGNOSIS — M6281 Muscle weakness (generalized): Secondary | ICD-10-CM | POA: Diagnosis not present

## 2022-01-17 ENCOUNTER — Encounter (INDEPENDENT_AMBULATORY_CARE_PROVIDER_SITE_OTHER): Payer: Self-pay | Admitting: Gastroenterology

## 2022-01-21 DIAGNOSIS — S72002D Fracture of unspecified part of neck of left femur, subsequent encounter for closed fracture with routine healing: Secondary | ICD-10-CM | POA: Diagnosis not present

## 2022-01-21 DIAGNOSIS — Z96642 Presence of left artificial hip joint: Secondary | ICD-10-CM | POA: Diagnosis not present

## 2022-01-21 DIAGNOSIS — M6281 Muscle weakness (generalized): Secondary | ICD-10-CM | POA: Diagnosis not present

## 2022-01-21 DIAGNOSIS — R2689 Other abnormalities of gait and mobility: Secondary | ICD-10-CM | POA: Diagnosis not present

## 2022-01-23 DIAGNOSIS — Z96642 Presence of left artificial hip joint: Secondary | ICD-10-CM | POA: Diagnosis not present

## 2022-01-23 DIAGNOSIS — S72002D Fracture of unspecified part of neck of left femur, subsequent encounter for closed fracture with routine healing: Secondary | ICD-10-CM | POA: Diagnosis not present

## 2022-01-23 DIAGNOSIS — M6281 Muscle weakness (generalized): Secondary | ICD-10-CM | POA: Diagnosis not present

## 2022-01-23 DIAGNOSIS — R2689 Other abnormalities of gait and mobility: Secondary | ICD-10-CM | POA: Diagnosis not present

## 2022-02-07 DIAGNOSIS — Z1231 Encounter for screening mammogram for malignant neoplasm of breast: Secondary | ICD-10-CM | POA: Diagnosis not present

## 2022-02-08 DIAGNOSIS — S72002D Fracture of unspecified part of neck of left femur, subsequent encounter for closed fracture with routine healing: Secondary | ICD-10-CM | POA: Diagnosis not present

## 2022-02-15 DIAGNOSIS — R922 Inconclusive mammogram: Secondary | ICD-10-CM | POA: Diagnosis not present

## 2022-02-15 DIAGNOSIS — R928 Other abnormal and inconclusive findings on diagnostic imaging of breast: Secondary | ICD-10-CM | POA: Diagnosis not present

## 2022-02-22 DIAGNOSIS — Z6821 Body mass index (BMI) 21.0-21.9, adult: Secondary | ICD-10-CM | POA: Diagnosis not present

## 2022-02-22 DIAGNOSIS — J3489 Other specified disorders of nose and nasal sinuses: Secondary | ICD-10-CM | POA: Diagnosis not present

## 2022-02-25 DIAGNOSIS — M810 Age-related osteoporosis without current pathological fracture: Secondary | ICD-10-CM | POA: Diagnosis not present

## 2022-02-28 ENCOUNTER — Ambulatory Visit (INDEPENDENT_AMBULATORY_CARE_PROVIDER_SITE_OTHER): Payer: Medicare Other | Admitting: Gastroenterology

## 2022-03-07 ENCOUNTER — Ambulatory Visit (INDEPENDENT_AMBULATORY_CARE_PROVIDER_SITE_OTHER): Payer: Medicare Other | Admitting: Gastroenterology

## 2022-03-07 ENCOUNTER — Encounter (INDEPENDENT_AMBULATORY_CARE_PROVIDER_SITE_OTHER): Payer: Self-pay | Admitting: Gastroenterology

## 2022-03-07 VITALS — BP 149/71 | HR 65 | Temp 97.8°F | Ht 62.0 in | Wt 117.8 lb

## 2022-03-07 DIAGNOSIS — K582 Mixed irritable bowel syndrome: Secondary | ICD-10-CM | POA: Diagnosis not present

## 2022-03-07 NOTE — Progress Notes (Signed)
Referring Provider: Manon Hilding, MD Primary Care Physician:  Manon Hilding, MD Primary GI Physician: Jenetta Downer  Chief Complaint  Patient presents with   Irritable Bowel Syndrome    Follow up on IBS. When first seen was on pain med due to hip surgery and having constipation. States since stopping pain meds. Constipation went away and having more trouble with frequent stools. Wakes at night having to have BM. Stomach makes noises.    HPI:   Rebekah King is a 81 y.o. female with past medical history of  hyperlipidemia, GERD, HTN   Patient presenting today for follow up of IBS.  History: Last seen 11/29/21, at that time patient reported  previously explosive BMs with a lot of abdominal grumbling and rolling.taking bentyl '20mg'$  BID, previously on QID but PCP had her drop back down to BID. having maybe 6 BMs per day, though these are very small in volume. She recently broke her L hip, states she has been on some pain medication, though only taking 1/2 tablet of her pain medication at night. taking 2 stool softeners per day. Has also used rectal suppositories x2 without much result. Reports she has not had a good BM in maybe 2 weeks, though last Saturday she had a somewhat larger BM, but no good volume stools since. She states that her PCP checks her iron studies routinely and these have been normal even prior to being on supplemental iron. She states she had a history of anemia when she was younger, went through a period where it normalized and now having anemia again. Hgb dropped to 8.5 on 10/21/21, she had hip surgery on 10/20/21. States she saw a blood doctor in the past and no cause of her anemia was every discovered. She does endorse some gradual weight loss of about 20 pounds but this has been over the past few years. Appetite is decent. Denies early satiety. Is maintained on protonix BID which works well for her. Previously only had a cough from her reflux but this has resolved.   Advised to  start miralax with uptitration instructions, stay well hydrated with increase of fruits, veggies and whole grains, bentyl only BID PRN, mylicon for gas, low FODMAP diet, iron studies and colonoscopy once hip is healed.  Iron studies with iron 140, TIBC 329, sat 43 and ferritin 172 on 11/29/21   Present: Patient reports that after last OV, constipation improved. She is back to looser stools and a lot of gas. Sometimes will get up in the night to have a bowel movement. She had stopped bentyl prior to last visit and has started this back up to BID, she takes 1/2 imodium if she is going out somewhere. She has the low FODMAP diet but is concerned that this was more for constipation. She is having on average 3-4 BMs per day. Stools are typically very loose with small "bits of stool" she endorses abdominal discomfort that improves after a BM. She does think that bentyl BID helps. She does report that she has to eat out a lot as her and her husband are not able to cook much. Denies rectal bleeding or melena. No weight loss, changes in appetite, nausea or vomiting.   Reports that she had hip surgery about 4.5 months ago, she is still having a lot issues with her hip pain. She is still unable to lie down without issue due to this.   Last Colonoscopy:maybe 25 years ago Last Endoscopy:never  Recommendations:  Update colonoscopy  Past Medical History:  Diagnosis Date   Acute postoperative anemia due to expected blood loss 10/22/2021   Arthritis    Complication of anesthesia    CLAUSTROPHOBIC- TROUBLE WITH ANYTHING OVER FACE   GERD (gastroesophageal reflux disease)    Glaucoma    Hypothyroidism    UTI (urinary tract infection)     Past Surgical History:  Procedure Laterality Date   ABDOMINAL HYSTERECTOMY     CATARACT EXTRACTION W/ INTRAOCULAR LENS  IMPLANT, BILATERAL     HERNIA REPAIR     HIP ARTHROPLASTY Left 10/20/2021   Procedure: ARTHROPLASTY BIPOLAR HIP (HEMIARTHROPLASTY);  Surgeon: Earlie Server, MD;  Location: [facility];  Service: Orthopedics;  Laterality: Left;   HIP FRACTURE SURGERY      RIGHT    (2ND SURGERY TO REMOVE PINS)   REFRACTIVE SURGERY     RETINAL DETACHMENT SURGERY     RIGHT   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     aspirin EC 81 MG tablet Take 81 mg by mouth daily. Swallow whole.     Calcium Carb-Cholecalciferol (CALCIUM 600+D3 PO) Take 1 tablet by mouth in the morning and at bedtime.     cefUROXime (CEFTIN) 250 MG tablet Take 1 tablet (250 mg total) by mouth 2 (two) times daily with a meal. 20 tablet 3   clopidogrel (PLAVIX) 75 MG tablet Take 1 tablet (75 mg total) by mouth daily. 30 tablet 11   dicyclomine (BENTYL) 10 MG capsule Take 10 mg by mouth 4 (four) times daily.     dorzolamide (TRUSOPT) 2 % ophthalmic solution Place 1 drop into both eyes 3 (three) times daily.      latanoprost (XALATAN) 0.005 % ophthalmic solution Place 1 drop into both eyes at bedtime.     levothyroxine (SYNTHROID, LEVOTHROID) 50 MCG tablet Take 50 mcg by mouth every evening.     meloxicam (MOBIC) 15 MG tablet Take 15 mg by mouth daily.     Multiple Vitamin (MULTIVITAMIN WITH MINERALS) TABS tablet Take 1 tablet by mouth daily.     nitrofurantoin (MACRODANTIN) 50 MG capsule Take 1 capsule (50 mg total) by mouth at bedtime. Take 1 capsule (50 mg total) by mouth at bedtime. 30 capsule 11   pantoprazole (PROTONIX) 40 MG tablet Take 40 mg by mouth 2 (two) times daily.     Polyethyl Glycol-Propyl Glycol (SYSTANE OP)  Apply to eye. 4 daily     Probiotic Product (PROBIOTIC DAILY PO) Take by mouth.     simvastatin (ZOCOR) 20 MG tablet Take 20 mg by mouth every evening.     dicyclomine (BENTYL) 20 MG tablet Take 10 mg by mouth 4 (four) times daily. (Patient not taking: Reported on 03/07/2022)     docusate sodium (COLACE) 100 MG capsule Take 1 capsule (100 mg total) by mouth 2 (two) times daily. (Patient not taking: Reported on 03/07/2022) 20 capsule 0   ferrous sulfate 325 (65 FE) MG tablet Take 1 tablet (325 mg total) by mouth 3 (three) times daily after meals. (Patient not  taking: Reported on 03/07/2022) 30 tablet 3   hydrALAZINE (APRESOLINE) 10 MG tablet Take 10 mg by mouth 3 (three) times daily. (Patient not taking: Reported on 03/07/2022)     hydrochlorothiazide (HYDRODIURIL) 12.5 MG tablet Take 12.5 mg by mouth daily. (Patient not taking: Reported on 03/07/2022)     HYDROcodone-acetaminophen (NORCO/VICODIN) 5-325 MG tablet Take 1 tablet by mouth every 4 (four) hours as needed for moderate pain. (Patient not taking: Reported on 03/07/2022) 40 tablet 0   losartan (COZAAR) 100 MG tablet Take 100 mg by mouth daily. (Patient not taking: Reported on 03/07/2022)     No current facility-administered medications for this visit.    Allergies as of 03/07/2022 - Review Complete 03/07/2022  Allergen Reaction Noted   Brimonidine Other (See Comments) 03/07/2020   Timolol Other (See Comments) 03/07/2020   Beef-derived products  10/21/2021   Misc. sulfonamide containing compounds  06/27/2021   Amoxicillin-pot clavulanate Diarrhea 11/12/2010   Sulfa antibiotics Nausea Only and Rash 02/27/2016    No family history on file.  Social History   Socioeconomic History   Marital status: Married    Spouse name: Not on file   Number of children: Not on file   Years of education: Not on file   Highest education level: Not on file  Occupational History   Not on file  Tobacco Use   Smoking status: Never    Passive exposure:  Never   Smokeless tobacco: Never  Vaping Use   Vaping Use: Never used  Substance and Sexual Activity   Alcohol use: No   Drug use: No   Sexual activity: Not on file  Other Topics Concern   Not on file  Social History Narrative   Not on file   Social Determinants of Health   Financial Resource Strain: Not on file  Food Insecurity: Not on file  Transportation Needs: Not on file  Physical Activity: Not on file  Stress: Not on file  Social Connections: Not on file   Review of systems General: negative for malaise, night sweats, fever, chills, weight loss Neck: Negative for lumps, goiter, pain and significant neck swelling Resp: Negative for cough, wheezing, dyspnea at rest CV: Negative for chest pain, leg swelling, palpitations, orthopnea GI: denies melena, hematochezia, nausea, vomiting, constipation, dysphagia, odyonophagia, early satiety or unintentional weight loss. +loose stools +flatulence +abdominal cramping MSK: Negative for joint pain or swelling, back pain, and muscle pain. Derm: Negative for itching or rash Psych: Denies depression, anxiety, memory loss, confusion. No homicidal or suicidal ideation.  Heme: Negative for prolonged bleeding, bruising easily, and swollen nodes. Endocrine: Negative for cold or heat intolerance, polyuria, polydipsia and goiter. Neuro: negative for tremor, gait imbalance, syncope and seizures. The remainder of the review of systems is noncontributory.  Physical Exam: BP (!) 149/71 (BP Location: Left Arm, Patient Position: Sitting, Cuff Size: Normal)   Pulse 65   Temp 97.8 F (36.6 C) (Oral)   Ht '5\' 2"'$  (1.575 m)   Wt 117 lb 12.8 oz (53.4 kg)   BMI 21.55 kg/m  General:   Alert and oriented. No distress noted. Pleasant and cooperative.  Head:  Normocephalic and atraumatic. Eyes:  Conjuctiva clear without scleral icterus. Mouth:  Oral mucosa pink and moist. Good dentition. No lesions. Heart: Normal rate and rhythm, s1 and s2 heart sounds  present.  Lungs: Clear lung sounds in all lobes. Respirations equal and unlabored. Abdomen:  +BS, soft, non-tender and non-distended. No rebound or guarding. No HSM or masses noted.  Derm: No palmar erythema or jaundice Msk:  Symmetrical without gross deformities. Normal posture. Extremities:  Without edema. Neurologic:  Alert and  oriented x4 Psych:  Alert and cooperative. Normal mood and affect.  Invalid input(s): "6 MONTHS"   ASSESSMENT: CARRAH EPPOLITO is a 81 y.o. female presenting today for follow up of IBS.  Having constipation at last OV, used miralax with improvement, now having looser stools again, up to 5-6 per day. Taking bentyl BID after her meal and using 1/2 tablet of imodium when going out. Denies rectal bleeding or melena. Has some abdominal cramping improved with BM. Also with some flatulence, she is using otc gas pill with good relief of this. Discussed continued recommendations to proceed with updating colonoscopy, however, she is still having issues with her hip and feels that right now she would not be able to tolerate procedure. Encouraged patient to continue to follow low FODMAP diet and keep track of foods and symptoms to try and correlate triggers. She can increase bentyl to TID and should take about 30 minutes prior to her meals. Will also add benefiber 1T 2-3x/day with meals. Also recommended celiac testing to rule out underlying gluten allergy, however, patient declined at this time.  No red flag symptoms. Patient denies melena, hematochezia, nausea, vomiting, constipation, dysphagia, odyonophagia, early satiety or weight loss.    PLAN:  try taking bentyl before meals, can increase to TID  2. Benefiber 1T TID with meals  3. Make sure to drink pretty of water 4. Pt to let me know when she is ready to schedule colonoscopy, consider random colonic biopsies at that time 5. Pt declined celiac testing, will let me know if she changes her mind  All questions were  answered, patient verbalized understanding and is in agreement with plan as outlined above.    Follow Up: 6 months   Rebekah King L. Alver Sorrow, MSN, APRN, AGNP-C Adult-Gerontology Nurse Practitioner [facility]

## 2022-03-07 NOTE — Patient Instructions (Signed)
It was nice to see you today! Let's try doing bentyl about 30 minutes before meals, you can increase to three times per day if you feel comfortable doing this. Please continue to look at the low FODMAP diet and keep track of what symptoms you have and how they correlate with foods you have eaten to help pinpoint things you may need to avoid Please let me know once you are ready to schedule colonoscopy Start taking over the counter benefiber 1 tablespoon 2-3 times per day with meals  Follow up 6 months

## 2022-04-12 DIAGNOSIS — S72002D Fracture of unspecified part of neck of left femur, subsequent encounter for closed fracture with routine healing: Secondary | ICD-10-CM | POA: Diagnosis not present

## 2022-05-10 DIAGNOSIS — E039 Hypothyroidism, unspecified: Secondary | ICD-10-CM | POA: Diagnosis not present

## 2022-05-10 DIAGNOSIS — D649 Anemia, unspecified: Secondary | ICD-10-CM | POA: Diagnosis not present

## 2022-05-10 DIAGNOSIS — K2100 Gastro-esophageal reflux disease with esophagitis, without bleeding: Secondary | ICD-10-CM | POA: Diagnosis not present

## 2022-05-10 DIAGNOSIS — R5383 Other fatigue: Secondary | ICD-10-CM | POA: Diagnosis not present

## 2022-05-10 DIAGNOSIS — E7849 Other hyperlipidemia: Secondary | ICD-10-CM | POA: Diagnosis not present

## 2022-05-10 DIAGNOSIS — D519 Vitamin B12 deficiency anemia, unspecified: Secondary | ICD-10-CM | POA: Diagnosis not present

## 2022-05-10 DIAGNOSIS — I10 Essential (primary) hypertension: Secondary | ICD-10-CM | POA: Diagnosis not present

## 2022-05-10 DIAGNOSIS — D529 Folate deficiency anemia, unspecified: Secondary | ICD-10-CM | POA: Diagnosis not present

## 2022-05-10 DIAGNOSIS — R739 Hyperglycemia, unspecified: Secondary | ICD-10-CM | POA: Diagnosis not present

## 2022-05-10 DIAGNOSIS — E875 Hyperkalemia: Secondary | ICD-10-CM | POA: Diagnosis not present

## 2022-05-13 DIAGNOSIS — H903 Sensorineural hearing loss, bilateral: Secondary | ICD-10-CM | POA: Diagnosis not present

## 2022-05-16 DIAGNOSIS — N183 Chronic kidney disease, stage 3 unspecified: Secondary | ICD-10-CM | POA: Diagnosis not present

## 2022-05-16 DIAGNOSIS — D649 Anemia, unspecified: Secondary | ICD-10-CM | POA: Diagnosis not present

## 2022-05-16 DIAGNOSIS — E039 Hypothyroidism, unspecified: Secondary | ICD-10-CM | POA: Diagnosis not present

## 2022-05-16 DIAGNOSIS — M419 Scoliosis, unspecified: Secondary | ICD-10-CM | POA: Diagnosis not present

## 2022-05-16 DIAGNOSIS — E7849 Other hyperlipidemia: Secondary | ICD-10-CM | POA: Diagnosis not present

## 2022-05-16 DIAGNOSIS — Z23 Encounter for immunization: Secondary | ICD-10-CM | POA: Diagnosis not present

## 2022-05-16 DIAGNOSIS — E871 Hypo-osmolality and hyponatremia: Secondary | ICD-10-CM | POA: Diagnosis not present

## 2022-05-16 DIAGNOSIS — R609 Edema, unspecified: Secondary | ICD-10-CM | POA: Diagnosis not present

## 2022-05-16 DIAGNOSIS — G3184 Mild cognitive impairment, so stated: Secondary | ICD-10-CM | POA: Diagnosis not present

## 2022-05-16 DIAGNOSIS — I10 Essential (primary) hypertension: Secondary | ICD-10-CM | POA: Diagnosis not present

## 2022-05-16 DIAGNOSIS — R7301 Impaired fasting glucose: Secondary | ICD-10-CM | POA: Diagnosis not present

## 2022-05-22 DIAGNOSIS — H401134 Primary open-angle glaucoma, bilateral, indeterminate stage: Secondary | ICD-10-CM | POA: Diagnosis not present

## 2022-06-10 ENCOUNTER — Ambulatory Visit: Payer: Medicare Other | Admitting: Physician Assistant

## 2022-06-28 ENCOUNTER — Ambulatory Visit: Payer: Medicare Other | Admitting: Physician Assistant

## 2022-07-01 ENCOUNTER — Ambulatory Visit: Payer: Medicare Other | Admitting: Physician Assistant

## 2022-07-03 ENCOUNTER — Other Ambulatory Visit: Payer: Self-pay | Admitting: Urology

## 2022-07-03 DIAGNOSIS — N3021 Other chronic cystitis with hematuria: Secondary | ICD-10-CM

## 2022-07-12 ENCOUNTER — Ambulatory Visit (INDEPENDENT_AMBULATORY_CARE_PROVIDER_SITE_OTHER): Payer: Medicare Other | Admitting: Urology

## 2022-07-12 ENCOUNTER — Encounter: Payer: Self-pay | Admitting: Urology

## 2022-07-12 VITALS — BP 188/76 | HR 64 | Ht 61.0 in | Wt 114.0 lb

## 2022-07-12 DIAGNOSIS — N3021 Other chronic cystitis with hematuria: Secondary | ICD-10-CM

## 2022-07-12 LAB — URINALYSIS, ROUTINE W REFLEX MICROSCOPIC
Bilirubin, UA: NEGATIVE
Glucose, UA: NEGATIVE
Ketones, UA: NEGATIVE
Leukocytes,UA: NEGATIVE
Nitrite, UA: NEGATIVE
RBC, UA: NEGATIVE
Specific Gravity, UA: 1.01 (ref 1.005–1.030)
Urobilinogen, Ur: 0.2 mg/dL (ref 0.2–1.0)
pH, UA: 5.5 (ref 5.0–7.5)

## 2022-07-12 LAB — MICROSCOPIC EXAMINATION
Bacteria, UA: NONE SEEN
RBC, Urine: NONE SEEN /hpf (ref 0–2)
WBC, UA: NONE SEEN /hpf (ref 0–5)

## 2022-07-12 MED ORDER — NITROFURANTOIN MACROCRYSTAL 50 MG PO CAPS: 50.0000 mg | ORAL_CAPSULE | Freq: Every day | ORAL | 11 refills | Status: DC

## 2022-07-12 NOTE — Progress Notes (Unsigned)
07/12/2022 12:08 PM   Rebekah King 1940-11-13 [id number]  Referring provider: Manon Hilding, MD [address],  [place] [address]  Followup recurrent UTI   HPI: No UTis since last viist. UA normal. She remains on macrodantin '50mg'$  qhs   PMH: Past Medical History:  Diagnosis Date   Acute postoperative anemia due to expected blood loss 10/22/2021   Arthritis    Complication of anesthesia    CLAUSTROPHOBIC- TROUBLE WITH ANYTHING OVER FACE   GERD (gastroesophageal reflux disease)    Glaucoma    Hypothyroidism    UTI (urinary tract infection)     Surgical History: Past Surgical History:  Procedure Laterality Date   ABDOMINAL HYSTERECTOMY     CATARACT EXTRACTION W/ INTRAOCULAR LENS  IMPLANT, BILATERAL     HERNIA REPAIR     HIP ARTHROPLASTY Left 10/20/2021   Procedure: ARTHROPLASTY BIPOLAR HIP (HEMIARTHROPLASTY);  Surgeon: Earlie Server, MD;  Location: [facility];  Service: Orthopedics;  Laterality: Left;   HIP FRACTURE SURGERY      RIGHT    (2ND SURGERY TO REMOVE PINS)   REFRACTIVE SURGERY     RETINAL DETACHMENT SURGERY     RIGHT   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 Sulfonamide Containing Compounds    Amoxicillin-pot Clavulanate Diarrhea   GI pain   Sulfa Antibiotics Nausea Only, Rash   Hands go numb        Medication List        Accurate as of July 12, 2022 12:08 PM. If you have any questions, ask your  nurse or doctor.          acetaminophen 500 MG tablet Commonly known as: TYLENOL Take 1,000 mg by mouth every 6 (six) hours as needed.   aspirin EC 81 MG tablet Take 81 mg by mouth daily. Swallow whole.   CALCIUM 600+D3 PO Take 1 tablet by mouth in the morning and at bedtime.   cefUROXime 250 MG tablet Commonly known as: CEFTIN Take 1 tablet (250 mg total) by mouth 2 (two) times daily with a meal.   clopidogrel 75 MG tablet Commonly known as: PLAVIX Take 1 tablet (75 mg total) by mouth daily.   dicyclomine 10 MG capsule Commonly known as: BENTYL Take 10 mg by mouth 4 (four) times daily.   dorzolamide 2 % ophthalmic solution Commonly known as: TRUSOPT Place 1 drop into both eyes 3 (three) times daily.   latanoprost 0.005 % ophthalmic solution Commonly known as: XALATAN Place 1 drop into both eyes at bedtime.   levothyroxine 50 MCG tablet Commonly known as: SYNTHROID Take 50 mcg by mouth every evening.   meloxicam 15 MG tablet Commonly known as: MOBIC Take 15 mg by mouth daily.   multivitamin with minerals Tabs tablet Take 1 tablet by mouth daily.   nitrofurantoin 50 MG capsule Commonly known as: MACRODANTIN TAKE ONE CAPSULE BY MOUTH AT BEDTIME  pantoprazole 40 MG tablet Commonly known as: PROTONIX Take 40 mg by mouth 2 (two) times daily.   PROBIOTIC DAILY PO Take by mouth.   simvastatin 20 MG tablet Commonly known as: ZOCOR Take 20 mg by mouth every evening.   SYSTANE OP Apply to eye. 4 daily        Allergies:  Allergies  Allergen Reactions   Brimonidine Other (See Comments)    Severe dry mouth   Timolol Other (See Comments)    Lightheaded   Beef-Derived Products     After being bitten by tick   Misc. Sulfonamide Containing Compounds    Amoxicillin-Pot Clavulanate Diarrhea    GI pain     Sulfa Antibiotics Nausea Only and Rash    Hands go numb    Family History: No family history on file.  Social History:  reports that she has  never smoked. She has never been exposed to tobacco smoke. She has never used smokeless tobacco. She reports that she does not drink alcohol and does not use drugs.  ROS: All other review of systems were reviewed and are negative except what is noted above in HPI  Physical Exam: BP (!) 188/76   Pulse 64   Ht '5\' 1"'$  (1.549 m)   Wt 114 lb (51.7 kg)   BMI 21.54 kg/m   Constitutional:  Alert and oriented, No acute distress. HEENT: [place] AT, moist mucus membranes.  Trachea midline, no masses. Cardiovascular: No clubbing, cyanosis, or edema. Respiratory: Normal respiratory effort, no increased work of breathing. GI: Abdomen is soft, nontender, nondistended, no abdominal masses GU: No CVA tenderness.  Lymph: No cervical or inguinal lymphadenopathy. Skin: No rashes, bruises or suspicious lesions. Neurologic: Grossly intact, no focal deficits, moving all 4 extremities. Psychiatric: Normal mood and affect.  Laboratory Data: Lab Results  Component Value Date   WBC 7.4 10/23/2021   HGB 9.6 (L) 10/23/2021   HCT 27.5 (L) 10/23/2021   MCV 94.2 10/23/2021   PLT 173 10/23/2021    Lab Results  Component Value Date   CREATININE 0.57 10/22/2021    No results found for: "PSA"  No results found for: "TESTOSTERONE"  Lab Results  Component Value Date   HGBA1C 5.4 09/03/2021    Urinalysis    Component Value Date/Time   COLORURINE AMBER (A) 03/25/2021 2139   APPEARANCEUR Clear 12/12/2021 1454   LABSPEC 1.009 03/25/2021 2139   PHURINE 5.0 03/25/2021 2139   GLUCOSEU Negative 12/12/2021 1454   HGBUR SMALL (A) 03/25/2021 2139   BILIRUBINUR Negative 12/12/2021 [place] 03/25/2021 2139   PROTEINUR Negative 12/12/2021 1454   PROTEINUR NEGATIVE 03/25/2021 2139   UROBILINOGEN negative (A) 09/01/2019 1100   NITRITE Negative 12/12/2021 1454   NITRITE POSITIVE (A) 03/25/2021 2139   LEUKOCYTESUR 1+ (A) 12/12/2021 1454   LEUKOCYTESUR NEGATIVE 03/25/2021 2139    Lab Results   Component Value Date   LABMICR See below: 12/12/2021   WBCUA 11-30 (A) 12/12/2021   LABEPIT 0-10 12/12/2021   MUCUS Present 08/23/2020   BACTERIA None seen 12/12/2021    Pertinent Imaging: *** No results found for this or any previous visit.  No results found for this or any previous visit.  No results found for this or any previous visit.  No results found for this or any previous visit.  No results found for this or any previous visit.  No valid procedures specified. No results found for this or any previous visit.  No results found for this  or any previous visit.   Assessment & Plan:    1. Chronic cystitis with hematuria -continue macrodantin '50mg'$  qhs. Followup 1 year - Urinalysis, Routine w reflex microscopic   No follow-ups on file.  Nicolette Bang, MD  [facility] [place]

## 2022-07-12 NOTE — Patient Instructions (Signed)
Urinary Tract Infection, Adult  A urinary tract infection (UTI) is an infection of any part of the urinary tract. The urinary tract includes the kidneys, ureters, bladder, and urethra. These organs make, store, and get rid of urine in the body. An upper UTI affects the ureters and kidneys. A lower UTI affects the bladder and urethra. What are the causes? Most urinary tract infections are caused by bacteria in your genital area around your urethra, where urine leaves your body. These bacteria grow and cause inflammation of your urinary tract. What increases the risk? You are more likely to develop this condition if: You have a urinary catheter that stays in place. You are not able to control when you urinate or have a bowel movement (incontinence). You are female and you: Use a spermicide or diaphragm for birth control. Have low estrogen levels. Are pregnant. You have certain genes that increase your risk. You are sexually active. You take antibiotic medicines. You have a condition that causes your flow of urine to slow down, such as: An enlarged prostate, if you are female. Blockage in your urethra. A kidney stone. A nerve condition that affects your bladder control (neurogenic bladder). Not getting enough to drink, or not urinating often. You have certain medical conditions, such as: Diabetes. A weak disease-fighting system (immunesystem). Sickle cell disease. Gout. Spinal cord injury. What are the signs or symptoms? Symptoms of this condition include: Needing to urinate right away (urgency). Frequent urination. This may include small amounts of urine each time you urinate. Pain or burning with urination. Blood in the urine. Urine that smells bad or unusual. Trouble urinating. Cloudy urine. Vaginal discharge, if you are female. Pain in the abdomen or the lower back. You may also have: Vomiting or a decreased appetite. Confusion. Irritability or tiredness. A fever or  chills. Diarrhea. The first symptom in older adults may be confusion. In some cases, they may not have any symptoms until the infection has worsened. How is this diagnosed? This condition is diagnosed based on your medical history and a physical exam. You may also have other tests, including: Urine tests. Blood tests. Tests for STIs (sexually transmitted infections). If you have had more than one UTI, a cystoscopy or imaging studies may be done to determine the cause of the infections. How is this treated? Treatment for this condition includes: Antibiotic medicine. Over-the-counter medicines to treat discomfort. Drinking enough water to stay hydrated. If you have frequent infections or have other conditions such as a kidney stone, you may need to see a health care provider who specializes in the urinary tract (urologist). In rare cases, urinary tract infections can cause sepsis. Sepsis is a life-threatening condition that occurs when the body responds to an infection. Sepsis is treated in the hospital with IV antibiotics, fluids, and other medicines. Follow these instructions at home:  Medicines Take over-the-counter and prescription medicines only as told by your health care provider. If you were prescribed an antibiotic medicine, take it as told by your health care provider. Do not stop using the antibiotic even if you start to feel better. General instructions Make sure you: Empty your bladder often and completely. Do not hold urine for long periods of time. Empty your bladder after sex. Wipe from front to back after urinating or having a bowel movement if you are female. Use each tissue only one time when you wipe. Drink enough fluid to keep your urine pale yellow. Keep all follow-up visits. This is important. Contact a health   care provider if: Your symptoms do not get better after 1-2 days. Your symptoms go away and then return. Get help right away if: You have severe pain in  your back or your lower abdomen. You have a fever or chills. You have nausea or vomiting. Summary A urinary tract infection (UTI) is an infection of any part of the urinary tract, which includes the kidneys, ureters, bladder, and urethra. Most urinary tract infections are caused by bacteria in your genital area. Treatment for this condition often includes antibiotic medicines. If you were prescribed an antibiotic medicine, take it as told by your health care provider. Do not stop using the antibiotic even if you start to feel better. Keep all follow-up visits. This is important. This information is not intended to replace advice given to you by your health care provider. Make sure you discuss any questions you have with your health care provider. Document Revised: 02/25/2020 Document Reviewed: 02/25/2020 Elsevier Patient Education  2023 [employer]  

## 2022-08-15 DIAGNOSIS — Z23 Encounter for immunization: Secondary | ICD-10-CM | POA: Diagnosis not present

## 2022-09-09 ENCOUNTER — Encounter (INDEPENDENT_AMBULATORY_CARE_PROVIDER_SITE_OTHER): Payer: Self-pay | Admitting: Gastroenterology

## 2022-09-09 ENCOUNTER — Ambulatory Visit (INDEPENDENT_AMBULATORY_CARE_PROVIDER_SITE_OTHER): Payer: Medicare Other | Admitting: Gastroenterology

## 2022-09-09 VITALS — BP 134/85 | HR 67 | Temp 97.8°F | Ht 60.5 in | Wt 118.0 lb

## 2022-09-09 DIAGNOSIS — K582 Mixed irritable bowel syndrome: Secondary | ICD-10-CM | POA: Diagnosis not present

## 2022-09-09 NOTE — Progress Notes (Addendum)
Referring Provider: Manon Hilding, MD Primary Care Physician:  Manon Hilding, MD Primary GI Physician: Jenetta Downer   Chief Complaint  Patient presents with   Follow-up    Patient here today for a follow up appointment on her IBS mix. Patient says there has not been much change in her symptoms. She says she takes imodium prn especially if she is planning on going out.    HPI:   Rebekah King is a 82 y.o. female with past medical history of hyperlipidemia, GERD, HTN, IBS   Patient presenting today for follow up of IBS.  Last seen August 2023, at that time back to looser stools and a lot of gas. Sometimes will get up in the night to have a bowel movement. She had stopped bentyl prior to last visit and has started this back up to BID, she takes 1/2 imodium if she is going out somewhere. She has the low FODMAP diet but is concerned that this was more for constipation. She is having on average 3-4 BMs per day. Stools are typically very loose with small "bits of stool" she endorses abdominal discomfort that improves after a BM. She does think that bentyl BID helps.    Reports that she had hip surgery about 4.5 months ago, she is still having a lot issues with her hip pain. She is still unable to lie down without issue due to this.   Recommend taking bentyl before meals TID, benefiber 1T TID with meals, good water intake, declined celiac testing, pt to make me aware when she wished to schedule colonoscopy   Present:  She states that she is feeling some better. She did try fiber in the past which made her diarrhea worse. She is taking bentyl twice daily before meals. Feels that it helps some.  She tried taking it TID but did not notice much results.takes 1/2 imodium tablet when going out. She is typically having a BM in the morning, may have to return to the restroom shortly after 1-2 times. Occasionally she will have a BM at night. She notes a lot of rumbling and noise in her belly. She has some  lower belly pain at times, this usually resolves after she defecates. She has some gas at times but this is not often. Stools are mostly soft, denies watery diarrhea. Stools small a lot when she has an "attack" though does have normal sized, formed stools during other times, this has been ongoing for the past few years. Denies rectal bleeding or melena, no nausea, vomiting. Appetite is good. Weight is stable today at 118. She notes that she stays away from certain foods like cole slaw and salads as these do not agree with her.   She is still having a lot of issues with her hip, s/p hip replacement. She sees her orthopedic doctor about this in March. Also has some chronic back pain.   Last Colonoscopy:25 years ago  Last Endoscopy: never   Recommendations:    Past Medical History:  Diagnosis Date   Acute postoperative anemia due to expected blood loss 10/22/2021   Arthritis    Complication of anesthesia    CLAUSTROPHOBIC- TROUBLE WITH ANYTHING OVER FACE   GERD (gastroesophageal reflux disease)    Glaucoma    Hypothyroidism    UTI (urinary tract infection)     Past Surgical History:  Procedure Laterality Date   ABDOMINAL HYSTERECTOMY     CATARACT EXTRACTION W/ INTRAOCULAR LENS  IMPLANT, BILATERAL  HERNIA REPAIR     HIP ARTHROPLASTY Left 10/20/2021   Procedure: ARTHROPLASTY BIPOLAR HIP (HEMIARTHROPLASTY);  Surgeon: Earlie Server, MD;  Location: [facility];  Service: Orthopedics;  Laterality: Left;   HIP FRACTURE SURGERY      RIGHT    (2ND SURGERY TO REMOVE PINS)   REFRACTIVE SURGERY     RETINAL DETACHMENT SURGERY     RIGHT   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   Procedure: TOTAL KNEE ARTHROPLASTY;  Surgeon: Earlie Server, MD;  Location: [place];  Service: Orthopedics;  Laterality: Right;   TOTAL SHOULDER ARTHROPLASTY Right 11/25/2019   Procedure: TOTAL SHOULDER ARTHROPLASTY;  Surgeon: Justice Britain, MD;  Location: [facility];  Service: Orthopedics;  Laterality: Right;  177mn     Current Outpatient Medications  Medication Sig Dispense Refill   acetaminophen (TYLENOL) 500 MG tablet Take 1,000 mg by mouth every 6 (six) hours as needed.     aspirin EC 81 MG tablet Take 81 mg by mouth daily. Swallow whole.     Calcium Carb-Cholecalciferol (CALCIUM 600+D3 PO) Take 1 tablet by mouth daily at 6 (six) AM.     clopidogrel (PLAVIX) 75 MG tablet Take 1 tablet (75 mg total) by mouth daily. 30 tablet 11   dicyclomine (BENTYL) 10 MG capsule Take 10 mg by mouth 4 (four) times daily.     dorzolamide (TRUSOPT) 2 % ophthalmic solution Place 1 drop into both eyes 3 (three) times daily.      latanoprost (XALATAN) 0.005 % ophthalmic solution Place 1 drop into both eyes at bedtime.     levothyroxine (SYNTHROID, LEVOTHROID) 50 MCG tablet Take 50 mcg by mouth every evening.     meloxicam (MOBIC) 15 MG tablet Take 15 mg by mouth daily.     Multiple Vitamin (MULTIVITAMIN WITH MINERALS) TABS tablet Take 1 tablet by mouth daily.     nitrofurantoin (MACRODANTIN) 50 MG capsule Take 1 capsule (50 mg total) by mouth at bedtime. 30 capsule 11   pantoprazole (PROTONIX) 40 MG tablet Take 40 mg by mouth 2 (two) times daily.     Polyethyl Glycol-Propyl Glycol (SYSTANE OP) Apply to eye. 4 daily     Probiotic Product (PROBIOTIC DAILY PO) Take by mouth.     simvastatin (ZOCOR) 20 MG tablet Take 20 mg by mouth every evening.     No current facility-administered medications for this visit.    Allergies as of 09/09/2022 - Review Complete 09/09/2022  Allergen Reaction Noted   Brimonidine Other (See Comments) 03/07/2020   Timolol Other (See Comments) 03/07/2020   Beef-derived products  10/21/2021   Misc. sulfonamide containing compounds  06/27/2021   Amoxicillin-pot clavulanate Diarrhea 11/12/2010   Sulfa antibiotics Nausea Only and Rash 02/27/2016    History reviewed. No pertinent family history.  Social History   Socioeconomic History   Marital status: Married    Spouse name: Not on file    Number of children: Not on file   Years of education: Not on file   Highest education level: Not on file  Occupational History   Not on file  Tobacco Use   Smoking status: Never    Passive exposure: Never   Smokeless tobacco: Never  Vaping Use   Vaping Use: Never used  Substance and Sexual Activity   Alcohol use: No   Drug use: No   Sexual activity: Not on file  Other Topics Concern   Not on file  Social History Narrative   Not on file   Social Determinants of  Health   Financial Resource Strain: Not on file  Food Insecurity: Not on file  Transportation Needs: Not on file  Physical Activity: Not on file  Stress: Not on file  Social Connections: Not on file   Review of systems General: negative for malaise, night sweats, fever, chills, weight loss Neck: Negative for lumps, goiter, pain and significant neck swelling Resp: Negative for cough, wheezing, dyspnea at rest CV: Negative for chest pain, leg swelling, palpitations, orthopnea GI: denies melena, hematochezia, nausea, vomiting, constipation, dysphagia, odyonophagia, early satiety or unintentional weight loss. +loose stools +lower abdominal pain MSK: Negative for joint pain or swelling, back pain, and muscle pain. Derm: Negative for itching or rash Psych: Denies depression, anxiety, memory loss, confusion. No homicidal or suicidal ideation.  Heme: Negative for prolonged bleeding, bruising easily, and swollen nodes. Endocrine: Negative for cold or heat intolerance, polyuria, polydipsia and goiter. Neuro: negative for tremor, gait imbalance, syncope and seizures. The remainder of the review of systems is noncontributory.  Physical Exam: BP (!) 156/73 (BP Location: Right Arm, Patient Position: Sitting, Cuff Size: Large)   Pulse 66   Temp 97.8 F (36.6 C) (Temporal)   Ht 5' 0.5" (1.537 m)   Wt 118 lb (53.5 kg)   BMI 22.67 kg/m  General:   Alert and oriented. No distress noted. Pleasant and cooperative.  Head:   Normocephalic and atraumatic. Eyes:  Conjuctiva clear without scleral icterus. Mouth:  Oral mucosa pink and moist. Good dentition. No lesions. Heart: Normal rate and rhythm, s1 and s2 heart sounds present.  Lungs: Clear lung sounds in all lobes. Respirations equal and unlabored. Abdomen:  +BS, soft, non-tender and non-distended. No rebound or guarding. No HSM or masses noted. Derm: No palmar erythema or jaundice Msk:  Symmetrical without gross deformities. Normal posture. Extremities:  Without edema. Neurologic:  Alert and  oriented x4 Psych:  Alert and cooperative. Normal mood and affect.  Invalid input(s): "6 MONTHS"   ASSESSMENT: Rebekah King is a 82 y.o. female presenting today for follow up of IBS.   IBS: taking daily probiotic and bentyl BID before meals. Continues to have intermittent loose stools but denies overt diarrhea. No rectal bleeding or melena. Abdominal discomfort typically improved with defecation. Weight is stable. She tried fiber which gave her diarrhea. She tries to avoid known trigger foods. We discussed the importance utilizing the low FODMAP guide to help determine specific trigger foods and increasing foods that tend to be more well tolerated. I also discussed that stress can play a big role. Will continue with daily probiotic and bentyl 53m BID before meals. Patient has declined celiac testing previously.   Again, discussed updating colonoscopy given last evaluation was 25+ years ago, at this time, patient is concerned about increased risks of procedure as she reports she has very thin skin. she wishes to hold off. I discussed the importance of CRC screening with the patient, and we did discuss risks vs benefits of having colonoscopy at her age. She will make me aware if she changes her mind about proceeding   PLAN:  Continue with daily probiotic  2. Continue bentyl BID before meals (PCP manages refills) 3. Continue with good water intake  4.  Low FODMAP diet,  good stress management  5. Pt to make me aware if she wishes to proceed with updating colonoscopy  6. Continue imodium PRN  All questions were answered, patient verbalized understanding and is in agreement with plan as outlined above.   Follow Up: 1  year   Alanys Godino L. Alver Sorrow, MSN, APRN, AGNP-C Adult-Gerontology Nurse Practitioner [facility]  I have reviewed the note and agree with the APP's assessment as described in this progress note  Maylon Peppers, MD Gastroenterology and Hepatology [facility]

## 2022-09-09 NOTE — Patient Instructions (Addendum)
Please let me know if you decide to proceed with updating your colonoscopy Continue to use bentyl twice daily before meals and follow low FODMAP food guide, as we discussed, stress can play a big role in your IBS as well so be mindful of this Continue your daily probiotic Please let me know if you have any new or worsening symptoms  It was a pleasure to see you today. I want to create trusting relationships with patients and provide genuine, compassionate, and quality care. I truly value your feedback! please be on the lookout for a survey regarding your visit with me today. I appreciate your input about our visit and your time in completing this!    Alleen Kehm L. Alver Sorrow, MSN, APRN, AGNP-C Adult-Gerontology Nurse Practitioner [facility]   Follow up 1 year

## 2022-09-11 ENCOUNTER — Other Ambulatory Visit: Payer: Self-pay | Admitting: Neurology

## 2022-09-12 DIAGNOSIS — E7849 Other hyperlipidemia: Secondary | ICD-10-CM | POA: Diagnosis not present

## 2022-09-12 DIAGNOSIS — E039 Hypothyroidism, unspecified: Secondary | ICD-10-CM | POA: Diagnosis not present

## 2022-09-12 DIAGNOSIS — E875 Hyperkalemia: Secondary | ICD-10-CM | POA: Diagnosis not present

## 2022-09-12 DIAGNOSIS — R739 Hyperglycemia, unspecified: Secondary | ICD-10-CM | POA: Diagnosis not present

## 2022-09-12 DIAGNOSIS — K2100 Gastro-esophageal reflux disease with esophagitis, without bleeding: Secondary | ICD-10-CM | POA: Diagnosis not present

## 2022-09-17 DIAGNOSIS — E7849 Other hyperlipidemia: Secondary | ICD-10-CM | POA: Diagnosis not present

## 2022-09-17 DIAGNOSIS — K2100 Gastro-esophageal reflux disease with esophagitis, without bleeding: Secondary | ICD-10-CM | POA: Diagnosis not present

## 2022-09-17 DIAGNOSIS — E871 Hypo-osmolality and hyponatremia: Secondary | ICD-10-CM | POA: Diagnosis not present

## 2022-09-17 DIAGNOSIS — G3184 Mild cognitive impairment, so stated: Secondary | ICD-10-CM | POA: Diagnosis not present

## 2022-09-17 DIAGNOSIS — M419 Scoliosis, unspecified: Secondary | ICD-10-CM | POA: Diagnosis not present

## 2022-09-17 DIAGNOSIS — D649 Anemia, unspecified: Secondary | ICD-10-CM | POA: Diagnosis not present

## 2022-09-17 DIAGNOSIS — R609 Edema, unspecified: Secondary | ICD-10-CM | POA: Diagnosis not present

## 2022-09-17 DIAGNOSIS — N183 Chronic kidney disease, stage 3 unspecified: Secondary | ICD-10-CM | POA: Diagnosis not present

## 2022-09-17 DIAGNOSIS — E039 Hypothyroidism, unspecified: Secondary | ICD-10-CM | POA: Diagnosis not present

## 2022-09-17 DIAGNOSIS — I10 Essential (primary) hypertension: Secondary | ICD-10-CM | POA: Diagnosis not present

## 2022-09-17 DIAGNOSIS — R7301 Impaired fasting glucose: Secondary | ICD-10-CM | POA: Diagnosis not present

## 2022-09-30 ENCOUNTER — Ambulatory Visit: Payer: Medicare Other | Admitting: Neurology

## 2022-09-30 ENCOUNTER — Encounter: Payer: Self-pay | Admitting: Neurology

## 2022-09-30 VITALS — BP 168/63 | HR 67 | Ht 60.0 in | Wt 118.0 lb

## 2022-09-30 DIAGNOSIS — G459 Transient cerebral ischemic attack, unspecified: Secondary | ICD-10-CM

## 2022-09-30 MED ORDER — CLOPIDOGREL BISULFATE 75 MG PO TABS: 75.0000 mg | ORAL_TABLET | Freq: Every day | ORAL | 11 refills | Status: DC

## 2022-09-30 NOTE — Progress Notes (Signed)
[facility]  PATIENT: Rebekah King DOB: Dec 26, 1940  REQUESTING CLINICIAN: Sasser, Silvestre Moment, MD HISTORY FROM: Patient  REASON FOR VISIT: TIA   HISTORICAL  CHIEF COMPLAINT:  Chief Complaint  Patient presents with   Follow-up    Rm 12, with husband 1 year fu , states she is stable and doing well    INTERVAL HISTORY 09/30/2022:  Patient presents today for follow-up, she is accompanied by her husband.  Last visit was a 1 year ago for TIA.  Since then she has been doing very well, currently does not have any complaint or concern.  Of note she is both on aspirin and Plavix and the plan was for patient to take the DAPT for 21 days and to continue with Plavix thereafter, she will continue to take aspirin.  Again no new symptoms.    HISTORY OF PRESENT ILLNESS:  This is a 82 year old woman with past medical history of hypertension, hyperlipidemia, hypothyroidism and glaucoma who is presenting after hospitalization for TIA.  Patient reports on February 6 she was on the phone and could not talk, she reported her words were gibberish.  She tried to call her husband but could not remember his phone number.  Husband was able to come home and took her to the hospital.  Patient reports the entire episode lasted less than an hour.  She reported 4 days prior she did have right hand numbness.  In the ED she was noted to be very hypertensive, more than [id number] systolic, she did have a MRI brain which was negative for any acute stroke and a CT angiogram head and neck did not show any large vessel occlusion or stenosis.  She was discharged home with DAPT, aspirin and Plavix for total of 21 days and plan to continue with Plavix thereafter.  She was also started on hydralazine and amlodipine.  Patient reports compliance with the medication.  She has not follow-up with the hypertensive clinic yet.  Denies any additional symptoms since leaving the hospital.    OTHER MEDICAL CONDITIONS:  Hypertension, Glaucoma, Hypothyroidism, Hyperlipidemia    REVIEW OF SYSTEMS: Full 14 system review of systems performed and negative with exception of: as noted in the HPI.  ALLERGIES: Allergies  Allergen Reactions   Brimonidine Other (See Comments)    Severe dry mouth   Timolol Other (See Comments)    Lightheaded   Beef-Derived Products     After being bitten by tick   Misc. Sulfonamide Containing Compounds    Amoxicillin-Pot Clavulanate Diarrhea    GI pain     Sulfa Antibiotics Nausea Only and Rash    Hands go numb    HOME MEDICATIONS: Outpatient Medications Prior to Visit  Medication Sig Dispense Refill   acetaminophen (TYLENOL) 500 MG tablet Take 1,000 mg by mouth every 6 (six) hours as needed.     Calcium Carb-Cholecalciferol (CALCIUM 600+D3 PO) Take 1 tablet by mouth daily at 6 (six) AM.     dicyclomine (BENTYL) 10 MG capsule Take 10 mg by mouth 2 (two) times daily.     dorzolamide (TRUSOPT) 2 % ophthalmic solution Place 1 drop into both eyes 3 (three) times daily.      hydrALAZINE (APRESOLINE) 10 MG tablet Take 10 mg by mouth 2 (two) times daily.     latanoprost (XALATAN) 0.005 % ophthalmic solution Place 1 drop into both eyes at bedtime.     levothyroxine (SYNTHROID, LEVOTHROID) 50 MCG tablet Take 50 mcg by mouth every evening.  losartan (COZAAR) 100 MG tablet Take 100 mg by mouth daily.     meloxicam (MOBIC) 15 MG tablet Take 15 mg by mouth daily.     Multiple Vitamin (MULTIVITAMIN WITH MINERALS) TABS tablet Take 1 tablet by mouth daily.     nitrofurantoin (MACRODANTIN) 50 MG capsule Take 1 capsule (50 mg total) by mouth at bedtime. 30 capsule 11   pantoprazole (PROTONIX) 40 MG tablet Take 40 mg by mouth 2 (two) times daily.     Polyethyl Glycol-Propyl Glycol (SYSTANE OP) Apply to eye. 4 daily     Probiotic Product (PROBIOTIC DAILY PO) Take by mouth.     simvastatin (ZOCOR) 20 MG tablet Take 20 mg by mouth every evening.     aspirin EC 81 MG tablet Take 81 mg by  mouth daily. Swallow whole.     clopidogrel (PLAVIX) 75 MG tablet TAKE ONE TABLET BY MOUTH ONCE DAILY 30 tablet 11   No facility-administered medications prior to visit.    PAST MEDICAL HISTORY: Past Medical History:  Diagnosis Date   Acute postoperative anemia due to expected blood loss 10/22/2021   Arthritis    Complication of anesthesia    CLAUSTROPHOBIC- TROUBLE WITH ANYTHING OVER FACE   GERD (gastroesophageal reflux disease)    Glaucoma    Hypothyroidism    UTI (urinary tract infection)     PAST SURGICAL HISTORY: Past Surgical History:  Procedure Laterality Date   ABDOMINAL HYSTERECTOMY     CATARACT EXTRACTION W/ INTRAOCULAR LENS  IMPLANT, BILATERAL     HERNIA REPAIR     HIP ARTHROPLASTY Left 10/20/2021   Procedure: ARTHROPLASTY BIPOLAR HIP (HEMIARTHROPLASTY);  Surgeon: Earlie Server, MD;  Location: [facility];  Service: Orthopedics;  Laterality: Left;   HIP FRACTURE SURGERY      RIGHT    (2ND SURGERY TO REMOVE PINS)   REFRACTIVE SURGERY     RETINAL DETACHMENT SURGERY     RIGHT   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   Procedure: TOTAL KNEE ARTHROPLASTY;  Surgeon: Earlie Server, MD;  Location: [place];  Service: Orthopedics;  Laterality: Right;   TOTAL SHOULDER ARTHROPLASTY Right 11/25/2019   Procedure: TOTAL SHOULDER ARTHROPLASTY;  Surgeon: Justice Britain, MD;  Location: [facility];  Service: Orthopedics;  Laterality: Right;  111mn    FAMILY HISTORY: History reviewed. No pertinent family history.  SOCIAL HISTORY: Social History   Socioeconomic History   Marital status: Married    Spouse name: Not on file   Number of children: Not on file   Years of education: Not on file   Highest education level: Not on file  Occupational History   Not on file  Tobacco Use   Smoking status: Never    Passive exposure: Never   Smokeless tobacco: Never  Vaping Use   Vaping Use: Never used  Substance and Sexual Activity   Alcohol use: No   Drug use: No   Sexual activity: Not  on file  Other Topics Concern   Not on file  Social History Narrative   Not on file   Social Determinants of Health   Financial Resource Strain: Not on file  Food Insecurity: Not on file  Transportation Needs: Not on file  Physical Activity: Not on file  Stress: Not on file  Social Connections: Not on file  Intimate Partner Violence: Not on file    PHYSICAL EXAM  GENERAL EXAM/CONSTITUTIONAL: Vitals:  Vitals:   09/30/22 1014  BP: (!) 168/63  Pulse: 67  Weight: 118 lb (53.5  kg)  Height: 5' (1.524 m)   Body mass index is 23.05 kg/m. Wt Readings from Last 3 Encounters:  09/30/22 118 lb (53.5 kg)  09/09/22 118 lb (53.5 kg)  07/12/22 114 lb (51.7 kg)   Patient is in no distress; well developed, nourished and groomed; neck is supple  EYES: Visual fields full to confrontation, Extraocular movements intacts,   MUSCULOSKELETAL: Gait, strength, tone, movements noted in Neurologic exam below  NEUROLOGIC: MENTAL STATUS:      No data to display         awake, alert, oriented to person, place and time recent and remote memory intact normal attention and concentration language fluent, comprehension intact, naming intact fund of knowledge appropriate  CRANIAL NERVE:  2nd, 3rd, 4th, 6th - visual fields full to confrontation, extraocular muscles intact, no nystagmus 5th - facial sensation symmetric 7th - facial strength symmetric 8th - hearing intact 9th - palate elevates symmetrically, uvula midline 11th - shoulder shrug symmetric 12th - tongue protrusion midline  MOTOR:  normal bulk and tone, full strength in the BUE, BLE. 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   SENSORY:  normal and symmetric to light touch  COORDINATION:  finger-nose-finger, fine finger movements normal  GAIT/STATION:  normal    DIAGNOSTIC DATA (LABS, IMAGING, TESTING) - I reviewed patient records, labs, notes, testing and imaging myself where  available.  Lab Results  Component Value Date   WBC 7.4 10/23/2021   HGB 9.6 (L) 10/23/2021   HCT 27.5 (L) 10/23/2021   MCV 94.2 10/23/2021   PLT 173 10/23/2021      Component Value Date/Time   NA 134 (L) 10/22/2021 0315   K 3.5 10/22/2021 0315   CL 107 10/22/2021 0315   CO2 21 (L) 10/22/2021 0315   GLUCOSE 104 (H) 10/22/2021 0315   BUN 11 10/22/2021 0315   CREATININE 0.57 10/22/2021 0315   CALCIUM 8.1 (L) 10/22/2021 0315   PROT 7.1 10/19/2021 1745   ALBUMIN 4.6 10/19/2021 1745   AST 32 10/19/2021 1745   ALT 31 10/19/2021 1745   ALKPHOS 60 10/19/2021 1745   BILITOT 0.6 10/19/2021 1745   GFRNONAA >60 10/22/2021 0315   GFRAA >60 11/18/2019 1202   Lab Results  Component Value Date   CHOL 145 09/04/2021   HDL 53 09/04/2021   LDLCALC 70 09/04/2021   TRIG 110 09/04/2021   CHOLHDL 2.7 09/04/2021   Lab Results  Component Value Date   HGBA1C 5.4 09/03/2021   No results found for: "VITAMINB12" No results found for: "TSH"  MRI Brain 09/03/2021 No acute infarction, hemorrhage, or mass. Moderate to marked chronic microvascular ischemic changes.   CTA Head and Neck 09/03/2021 No large vessel occlusion, hemodynamically significant stenosis, or evidence of dissection   ASSESSMENT AND PLAN  82 y.o. year old female with vascular risk factors including hypertension and hyperlipidemia, who is presenting for follow-up for her TIA.  Overall she is doing well, no new complaints, no new symptoms.  She is on both aspirin and Plavix.  I advised her to discontinue the aspirin and to continue Plavix.  She will continue to follow with her PCP for blood pressure management.  Continue your other medications return in 1 year or sooner if worse   1. TIA (transient ischemic attack)      Patient Instructions  Discontinue aspirin Continue with Plavix 75 mg daily, new prescription given to patient Continue your other medications Continue follow-up PCP Return in 1 year or  sooner if  worse.  No orders of the defined types were placed in this encounter.   Meds ordered this encounter  Medications   clopidogrel (PLAVIX) 75 MG tablet    Sig: Take 1 tablet (75 mg total) by mouth daily.    Dispense:  30 tablet    Refill:  11    Return in about 1 year (around 09/30/2023).     Alric Ran, MD 09/30/2022, 6:31 PM  [facility] [address], [place], [place] [address] [phone]

## 2022-09-30 NOTE — Patient Instructions (Signed)
Discontinue aspirin Continue with Plavix 75 mg daily, new prescription given to patient Continue your other medications Continue follow-up PCP Return in 1 year or sooner if worse.

## 2022-10-24 DIAGNOSIS — H401134 Primary open-angle glaucoma, bilateral, indeterminate stage: Secondary | ICD-10-CM | POA: Diagnosis not present

## 2022-10-29 DIAGNOSIS — M25552 Pain in left hip: Secondary | ICD-10-CM | POA: Diagnosis not present

## 2022-11-01 DIAGNOSIS — M25552 Pain in left hip: Secondary | ICD-10-CM | POA: Diagnosis not present

## 2022-11-05 DIAGNOSIS — M25552 Pain in left hip: Secondary | ICD-10-CM | POA: Diagnosis not present

## 2022-11-05 DIAGNOSIS — M7072 Other bursitis of hip, left hip: Secondary | ICD-10-CM | POA: Diagnosis not present

## 2022-11-14 DIAGNOSIS — M25552 Pain in left hip: Secondary | ICD-10-CM | POA: Diagnosis not present

## 2023-01-06 ENCOUNTER — Encounter: Payer: Self-pay | Admitting: Gastroenterology

## 2023-01-06 ENCOUNTER — Ambulatory Visit: Payer: Medicare Other | Admitting: Gastroenterology

## 2023-01-06 VITALS — BP 139/78 | HR 76 | Temp 97.4°F | Ht 60.0 in | Wt 123.0 lb

## 2023-01-06 DIAGNOSIS — R140 Abdominal distension (gaseous): Secondary | ICD-10-CM

## 2023-01-06 DIAGNOSIS — K582 Mixed irritable bowel syndrome: Secondary | ICD-10-CM

## 2023-01-06 NOTE — Patient Instructions (Signed)
Reduce bentyl to 1 tablet daily. You may also take 1/2 tablet imodium daily. If you decide you want to stop bentyl altogether than you may take a half Imodium twice daily before meals.  Continue your daily probiotic.  I will give you send of IBgard to try.  You should take these once daily.  This may help you with the gassiness/bloating that occurs with your diarrhea.  Please let me know if you change your mind regarding a colonoscopy.  As we discussed we could consider a trial of Xifaxan if the scheduled Imodium and Bentyl are not effective.  Will plan to have you follow-up in about 8 weeks.  It was a pleasure to see you today. I want to create trusting relationships with patients. If you receive a survey regarding your visit,  I greatly appreciate you taking time to fill this out on paper or through your MyChart. I value your feedback.  Brooke Bonito, MSN, FNP-BC, AGACNP-BC [facility]

## 2023-01-06 NOTE — Progress Notes (Signed)
GI Office Note    Referring Provider: Estanislado Pandy, MD Primary Care Physician:  Estanislado Pandy, MD Primary Gastroenterologist: Dolores Frame, MD  Date:  01/06/2023  ID:  Rebekah King, DOB 04-12-1941, MRN [id number]  Chief Complaint   Chief Complaint  Patient presents with   Abdominal Pain    Lower stomach pains. Diarrhea and lots of gas. Having several accidents a week.    History of Present Illness  Rebekah King is a 82 y.o. female with a history of GERD, HTN, IBS, HLD presenting today with complaint of lower abdominal pain, diarrhea, and gassiness.  Last colonoscopy: About 25 years prior No prior EGD.  Seen in August 2023 with complaint of looser stools and a lot of gas.  Also having nocturnal stools.  Had stopped Bentyl prior to her last visit and started back taking twice daily.  Takes half of Imodium she is going out.  Had low FODMAP diet for reference but had not performed given she felt this was just for constipation.  Having 3-4 BMs daily.  Stools typically very loose and small amounts.  Abdominal discomfort improves after BM.  Bentyl was helpful.  She is recommended start taking Bentyl 3 times daily before meals and Benefiber 1 tablespoon 3 times daily with meals.  She declined celiac testing.  Advised to make aware if she decided she wanted to schedule colonoscopy.  Last office visit 09/09/2022.  Feeling somewhat better.  Did try fiber which she states made her diarrhea worse.  Taking Bentyl twice daily before meals and this helps some.  Tried taking it 3 times daily but did not notice much increase in results.  Continues to take a half of Imodium when going out.  Typically has a bowel movement in the morning and may have to return to the restroom shortly after 1-2 times.  Occasionally with BM at night.  Notes a lot of rumbling and noises in her belly.  Has some lower abdominal pain at times which usually resolves after defecation.  Also with gas at times but not  often.  Stools are mostly soft, denies watery diarrhea.  Denied any rectal bleeding, melena, nausea, vomiting.  Appetite is good.  Weight stable.  Tries to stay away from certain foods like coleslaw and salads as they do not agree with her.  Also having hip pain s/p hip replacement.  She is advised to continue with daily probiotic.  Continue Bentyl twice daily before meals.  Ensure good water intake.  Follow low FODMAP diet and good stress management.  Discussed colonoscopy however patient declined.  Continue Imodium as needed.  Today:  She states she had stayed on dicyclomine BID but reports her stools have been getting worse. She states it has been explosive and she was given gas pills to take for gas but states pharmacy was unable to help. She states she took 2 otc gas medications daily for a bout a month and states that made it worse. No N/V. Appetite is okay - at one point she did not.   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 She reports her vision is getting foggy. She goes next month. She was concerned about the bentyl contributing to this. Has been on the bentyl for a few years.   She takes 1/2 on a bad day when she is going somewhere. Maybe takes 1/2 one per week.   Most night she wakes up 1-2 times at night  and states she may have an explosion and have tiny pieces of poop. About 6am she may have a little bit more stools that is explosive and small loose pieces. She will have a lot of urgency around 7 am. She states at this time it is painful and she has had many accidents and has messed up clothes. The stools are very runny and have small little bits. She thinks if she did not have the gas that she would be better.   She states she weight 118lbs yesterday and today she weighed 123lbs. She states about 10 years ago she developed a lower abdominal area pouch.   Still takes a daily probiotic.   Took 1/2 imodium daily everyday for 3 days when she was gone and did not  have any accidents.    Current Outpatient Medications  Medication Sig Dispense Refill   acetaminophen (TYLENOL) 500 MG tablet Take 1,000 mg by mouth every 6 (six) hours as needed.     Calcium Carb-Cholecalciferol (CALCIUM 600+D3 PO) Take 1 tablet by mouth daily at 6 (six) AM.     clopidogrel (PLAVIX) 75 MG tablet Take 1 tablet (75 mg total) by mouth daily. 30 tablet 11   dicyclomine (BENTYL) 10 MG capsule Take 10 mg by mouth 2 (two) times daily.     dorzolamide (TRUSOPT) 2 % ophthalmic solution Place 1 drop into both eyes 3 (three) times daily.      hydrALAZINE (APRESOLINE) 10 MG tablet Take 10 mg by mouth 2 (two) times daily.     latanoprost (XALATAN) 0.005 % ophthalmic solution Place 1 drop into both eyes at bedtime.     levothyroxine (SYNTHROID, LEVOTHROID) 50 MCG tablet Take 50 mcg by mouth every evening.     losartan (COZAAR) 100 MG tablet Take 100 mg by mouth daily.     meloxicam (MOBIC) 15 MG tablet Take 15 mg by mouth daily.     Multiple Vitamin (MULTIVITAMIN WITH MINERALS) TABS tablet Take 1 tablet by mouth daily.     nitrofurantoin (MACRODANTIN) 50 MG capsule Take 1 capsule (50 mg total) by mouth at bedtime. 30 capsule 11   pantoprazole (PROTONIX) 40 MG tablet Take 40 mg by mouth 2 (two) times daily.     Polyethyl Glycol-Propyl Glycol (SYSTANE OP) Apply to eye. 4 daily     Probiotic Product (PROBIOTIC DAILY PO) Take by mouth.     simvastatin (ZOCOR) 20 MG tablet Take 20 mg by mouth every evening.     No current facility-administered medications for this visit.    Past Medical History:  Diagnosis Date   Acute postoperative anemia due to expected blood loss 10/22/2021   Arthritis    Complication of anesthesia    CLAUSTROPHOBIC- TROUBLE WITH ANYTHING OVER FACE   GERD (gastroesophageal reflux disease)    Glaucoma    Hypothyroidism    UTI (urinary tract infection)     Past Surgical History:  Procedure Laterality Date   ABDOMINAL HYSTERECTOMY     CATARACT EXTRACTION W/  INTRAOCULAR LENS  IMPLANT, BILATERAL     HERNIA REPAIR     HIP ARTHROPLASTY Left 10/20/2021   Procedure: ARTHROPLASTY BIPOLAR HIP (HEMIARTHROPLASTY);  Surgeon: Frederico Hamman, MD;  Location: [facility];  Service: Orthopedics;  Laterality: Left;   HIP FRACTURE SURGERY      RIGHT    (2ND SURGERY TO REMOVE PINS)   REFRACTIVE SURGERY     RETINAL DETACHMENT SURGERY     RIGHT   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 No pertinent family history.  Allergies as of 01/06/2023 - Review Complete 01/06/2023  Allergen Reaction Noted   Brimonidine Other (See Comments) 03/07/2020   Timolol Other (See Comments) 03/07/2020   Beef-derived products  10/21/2021   Misc. sulfonamide containing compounds  06/27/2021   Amoxicillin-pot clavulanate Diarrhea 11/12/2010   Sulfa antibiotics Nausea Only and Rash 02/27/2016    Social History   Socioeconomic History   Marital status: Married    Spouse name: Not on file   Number of children: Not on file   Years of education: Not on file   Highest education level: Not on file  Occupational History   Not on file  Tobacco Use   Smoking status: Never    Passive exposure: Never   Smokeless tobacco: Never  Vaping Use   Vaping Use: Never used  Substance and Sexual Activity   Alcohol use: No   Drug use: No   Sexual activity: Not on file  Other Topics Concern   Not on file  Social History Narrative   Not on file   Social Determinants of Health   Financial Resource Strain: Not on file  Food Insecurity: Not on file  Transportation Needs: Not on file  Physical Activity: Not on file  Stress: Not on file  Social Connections:  Not on file     Review of Systems   Gen: Denies fever, chills, anorexia. Denies fatigue, weakness, weight loss.  CV: Denies chest pain, palpitations, syncope, peripheral edema, and claudication. Resp: Denies dyspnea at rest, cough, wheezing, coughing up blood, and pleurisy. GI: See HPI Derm: Denies rash, itching, dry skin Psych: Denies depression, anxiety, memory loss, confusion. No homicidal or suicidal ideation.  Heme: Denies bruising, bleeding, and enlarged lymph nodes.   Physical Exam   BP 139/78 (BP Location: Right Arm, Patient Position: Sitting, Cuff Size: Normal)   Pulse 76   Temp (!) 97.4 F (36.3 C) (Temporal)   Ht 5' (1.524 m)   Wt 123 lb (55.8 kg)   SpO2 99%   BMI 24.02 kg/m   General:   Alert and oriented. No distress noted. Pleasant and cooperative.  Head:  Normocephalic and atraumatic. Eyes:  Conjuctiva clear without scleral icterus. Mouth:  Oral mucosa pink and moist. Good dentition. No lesions. Lungs:  Clear to auscultation bilaterally. No wheezes, rales, or rhonchi. No distress.  Heart:  S1, S2 present without murmurs appreciated.  Abdomen:  +BS, soft, non-tender and non-distended. No rebound or guarding. No HSM or masses noted. Rectal: deferred Msk:  Symmetrical without gross deformities. Normal posture. Extremities:  Without edema. Neurologic:  Alert and  oriented x4 Psych:  Alert and cooperative. Normal mood and affect.   Assessment  Rebekah King is a 82 y.o. female with a history of GERD, HTN, IBS, HLD presenting today with complaint of lower abdominal pain, diarrhea, and gassiness.  IBS, diarrhea predominant/gassiness: Has continued to struggle with diarrhea, 3-4 times daily but feels as though it is worsening given significant urgency with gas that is causing explosive stools that are loose and small with tiny pieces. Has been taking bentyl BID and 1/2 imodium about once per week when going out. Continues to have intermittent  nocturnal stools.  Discussed colonoscopy to help rule out microscopic colitis and she continues to defer due to fear of prep and her current diarrhea. This has been offered at multiple visits. Her most significant complaint is the gassiness. She continues a daily probiotic. Has concerns that bentyl is contributing to her vision issues (sees ophthalmologist in a few weeks - history of glaucoma). Weight remains stable per our scales. OTC anti gas medications have not provided relief. Imodium at times does seem to work well. Discussed decreasing bentyl dose to once daily and scheduling daily imodium (1/2 tablet). Discussed possibility of trial of Xifaxin as well. For now will also trial IBGard to help with bloating.   PLAN   Continue bentyl, reduce to once daily Schedule Imodium 1/2 tablet once daily.  Daily probiotic - continue Trial IBGard - samples provided Can consider trial of Xifaxin if current regimen ineffective.  Colonoscopy offered - patient deferred.  Follow up 8 weeks.   Brooke Bonito, MSN, FNP-BC, AGACNP-BC [facility]  I have reviewed the note and agree with the APP's assessment as described in this progress note  Can discuss possibility of implementing low FODMAP diet and follow-up appointment as well.  Katrinka Blazing, MD Gastroenterology and Hepatology [facility]

## 2023-01-08 DIAGNOSIS — Z23 Encounter for immunization: Secondary | ICD-10-CM | POA: Diagnosis not present

## 2023-01-27 DIAGNOSIS — H401134 Primary open-angle glaucoma, bilateral, indeterminate stage: Secondary | ICD-10-CM | POA: Diagnosis not present

## 2023-03-02 NOTE — Progress Notes (Unsigned)
GI Office Note    Referring Provider: Estanislado Pandy, MD Primary Care Physician:  Estanislado Pandy, MD Primary Gastroenterologist: Dolores Frame, MD  Date:  03/03/2023  ID:  Rebekah King, DOB Feb 06, 1941, MRN [id number]   Chief Complaint   Chief Complaint  Patient presents with   Follow-up    Follow up on IBS. Pt states she is better    History of Present Illness  Rebekah King is a 82 y.o. female with a history of IBS, GERD, HTN, HLD presenting today for follow-up of IBS.  Last colonoscopy: About 25 years prior No prior EGD.   Seen in August 2023 with complaint of looser stools and a lot of gas.  Also having nocturnal stools.  Had stopped Bentyl prior to her last visit and started back taking twice daily.  Takes half of Imodium she is going out.  Had low FODMAP diet for reference but had not performed given she felt this was just for constipation.  Having 3-4 BMs daily.  Stools typically very loose and small amounts.  Abdominal discomfort improves after BM.  Bentyl was helpful.  She is recommended start taking Bentyl 3 times daily before meals and Benefiber 1 tablespoon 3 times daily with meals.  She declined celiac testing.  Advised to make aware if she decided she wanted to schedule colonoscopy.   Office visit 09/09/2022.  Feeling somewhat better.  Did try fiber which she states made her diarrhea worse.  Taking Bentyl twice daily before meals and this helps some.  Tried taking it 3 times daily but did not notice much increase in results.  Continues to take a half of Imodium when going out.  Typically has a bowel movement in the morning and may have to return to the restroom shortly after 1-2 times.  Occasionally with BM at night.  Notes a lot of rumbling and noises in her belly.  Has some lower abdominal pain at times which usually resolves after defecation.  Also with gas at times but not often.  Stools are mostly soft, denies watery diarrhea.  Denied any rectal bleeding,  melena, nausea, vomiting.  Appetite is good.  Weight stable.  Tries to stay away from certain foods like coleslaw and salads as they do not agree with her.  Also having hip pain s/p hip replacement.  She is advised to continue with daily probiotic.  Continue Bentyl twice daily before meals.  Ensure good water intake.  Follow low FODMAP diet and good stress management.  Discussed colonoscopy however patient declined.  Continue Imodium as needed.  Last office visit 01/06/2023.  Stools been worse on dicyclomine twice daily.  Reports her stools have been explosive.  Having to OT with over-the-counter gas medication.  States that she thought it made it worse.  Denies any nausea or vomiting.  Recently having vision troubles.  Taking Imodium half a tablet on bad days or if she is going somewhere.  This is usually about once or twice per week.  Wakes up 1-2 times a night with stools.  Around 6 AM she has more explosive stools followed by small loose pieces as well as urgency.  This is painful at times and has many accidents with messing up her clothing.  Taking daily probiotic.  Advised to continue Bentyl and reduce to once daily.  Schedule Imodium half tablet once daily continue probiotic.  Advised to trial IBgard.  Consider Xifaxan if current regimen ineffective.  Today: She came off the dicyclomine due  to blurry vision. Blurry vision went away after the bentyl.   The IBgard has been controlling her gas very well. (Taking the generic [employer] brand). She has been taking 1 tablet 1-2 times daily. Is also taking 1/2 imodium every other day. Sometimes needs it daily. Everyday she goes in the morning when she gets up and goes again after breakfast. At times she goes 3 times in the morning. She is pleased with her current state. Salad and raw veggies make her go as well as cabbage and cooked cabbage.   Once in a while she has to get up 1-2 times a night but its a lot less than she was.   Has been taking ginger root  daily as well.   Current Outpatient Medications  Medication Sig Dispense Refill   acetaminophen (TYLENOL) 500 MG tablet Take 1,000 mg by mouth every 6 (six) hours as needed.     Calcium Carb-Cholecalciferol (CALCIUM 600+D3 PO) Take 1 tablet by mouth daily at 6 (six) AM.     clopidogrel (PLAVIX) 75 MG tablet Take 1 tablet (75 mg total) by mouth daily. 30 tablet 11   dorzolamide (TRUSOPT) 2 % ophthalmic solution Place 1 drop into both eyes 3 (three) times daily.      Ginger, Zingiber officinalis, (GINGER ROOT) 550 MG CAPS Take by mouth.     levothyroxine (SYNTHROID, LEVOTHROID) 50 MCG tablet Take 50 mcg by mouth every evening.     losartan (COZAAR) 100 MG tablet Take 100 mg by mouth daily.     meloxicam (MOBIC) 15 MG tablet Take 15 mg by mouth daily.     Multiple Vitamin (MULTIVITAMIN WITH MINERALS) TABS tablet Take 1 tablet by mouth daily.     nitrofurantoin (MACRODANTIN) 50 MG capsule Take 1 capsule (50 mg total) by mouth at bedtime. 30 capsule 11   pantoprazole (PROTONIX) 40 MG tablet Take 40 mg by mouth 2 (two) times daily.     Peppermint Oil (IBGARD PO) Take by mouth.     Polyethyl Glycol-Propyl Glycol (SYSTANE OP) Apply to eye. 4 daily     Probiotic Product (PROBIOTIC DAILY PO) Take by mouth.     simvastatin (ZOCOR) 20 MG tablet Take 20 mg by mouth every evening.     dicyclomine (BENTYL) 10 MG capsule Take 10 mg by mouth 2 (two) times daily. (Patient not taking: Reported on 03/03/2023)     hydrALAZINE (APRESOLINE) 10 MG tablet Take 10 mg by mouth 2 (two) times daily. (Patient not taking: Reported on 03/03/2023)     No current facility-administered medications for this visit.    Past Medical History:  Diagnosis Date   Acute postoperative anemia due to expected blood loss 10/22/2021   Arthritis    Complication of anesthesia    CLAUSTROPHOBIC- TROUBLE WITH ANYTHING OVER FACE   GERD (gastroesophageal reflux disease)    Glaucoma    Hypothyroidism    UTI (urinary tract infection)      Past Surgical History:  Procedure Laterality Date   ABDOMINAL HYSTERECTOMY     CATARACT EXTRACTION W/ INTRAOCULAR LENS  IMPLANT, BILATERAL     HERNIA REPAIR     HIP ARTHROPLASTY Left 10/20/2021   Procedure: ARTHROPLASTY BIPOLAR HIP (HEMIARTHROPLASTY);  Surgeon: Frederico Hamman, MD;  Location: [facility];  Service: Orthopedics;  Laterality: Left;   HIP FRACTURE SURGERY      RIGHT    (2ND SURGERY TO REMOVE PINS)   REFRACTIVE SURGERY     RETINAL DETACHMENT SURGERY     RIGHT  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   Procedure: TOTAL KNEE ARTHROPLASTY;  Surgeon: Frederico Hamman, MD;  Location: [facility];  Service: Orthopedics;  Laterality: Right;   TOTAL SHOULDER ARTHROPLASTY Right 11/25/2019   Procedure: TOTAL SHOULDER ARTHROPLASTY;  Surgeon: Francena Hanly, MD;  Location: [facility];  Service: Orthopedics;  Laterality: Right;     No family history on file.  Allergies as of 03/03/2023 - Review Complete 03/03/2023  Allergen Reaction Noted   Brimonidine Other (See Comments) 03/07/2020   Timolol Other (See Comments) 03/07/2020   Beef-derived products  10/21/2021   Misc. sulfonamide containing compounds  06/27/2021   Amoxicillin-pot clavulanate Diarrhea 11/12/2010   Sulfa antibiotics Nausea Only and Rash 02/27/2016    Social History   Socioeconomic History   Marital status: Married    Spouse name: Not on file   Number of children: Not on file   Years of education: Not on file   Highest education level: Not on file  Occupational History   Not on file  Tobacco Use   Smoking status: Never    Passive exposure: Never   Smokeless tobacco: Never  Vaping Use   Vaping status: Never Used  Substance and Sexual Activity   Alcohol use: No   Drug use: No   Sexual activity: Not on file  Other Topics Concern   Not on file  Social History Narrative   Not on file   Social Determinants of Health   Financial Resource Strain: Not on file  Food Insecurity: Not on file  Transportation  Needs: Not on file  Physical Activity: Not on file  Stress: Not on file  Social Connections: Not on file     Review of Systems   Gen: Denies fever, chills, anorexia. Denies fatigue, weakness, weight loss.  CV: Denies chest pain, palpitations, syncope, peripheral edema, and claudication. Resp: Denies dyspnea at rest, cough, wheezing, coughing up blood, and pleurisy. GI: See HPI Derm: Denies rash, itching, dry skin Psych: Denies depression, anxiety, memory loss, confusion. No homicidal or suicidal ideation.  Heme: Denies bruising, bleeding, and enlarged lymph nodes.   Physical Exam   BP (!) 182/72   Pulse 60   Temp 98.2 F (36.8 C)   Ht 5' (1.524 m)   Wt 121 lb 6.4 oz (55.1 kg)   BMI 23.71 kg/m   General:   Alert and oriented. No distress noted. Pleasant and cooperative.  Head:  Normocephalic and atraumatic. Eyes:  Conjuctiva clear without scleral icterus. Mouth:  Oral mucosa pink and moist. Good dentition. No lesions. Abdomen:  +BS, soft, non-tender and non-distended. No rebound or guarding. No HSM or masses noted. Rectal: deferred Msk:  Symmetrical without gross deformities. Normal posture. Extremities:  Without edema. Neurologic:  Alert and  oriented x4 Psych:  Alert and cooperative. Normal mood and affect.   Assessment  Rebekah King is a 82 y.o. female with a history of IBS, GERD, HTN, HLD presenting today for follow-up of IBS.  IBS, diarrhea predominant/gassiness: Previously suffering from extreme gassiness and urgency with loose stools.  Tried Bentyl twice daily but was suffering from blurry vision which has improved since stopping.  Was also having more frequent intermittent nocturnal stools.  We have previously discussed colonoscopy however she has declined.  Has been doing very well since taking Imodium daily to every other day as well as IBgard daily.  Given that she is having less gas she is having a decreased amount of stools as well. Continues on daily  probiotic. Given  a friend is tacking visibiome she would like to try this as well so will provide some samples today. She has expressed that following low FODMAP in the past has not helped much. Can still consider xifaxin in the future if symptoms worsen.   PLAN   Continue IBGard  Continue 1/2 imodium daily to every other day.  Continue daily probiotic. Trial Visibiome probiotic. Samples provided. Follow up 6 months.     Brooke Bonito, MSN, FNP-BC, AGACNP-BC [facility]

## 2023-03-03 ENCOUNTER — Ambulatory Visit: Payer: Medicare Other | Admitting: Gastroenterology

## 2023-03-03 ENCOUNTER — Encounter: Payer: Self-pay | Admitting: Gastroenterology

## 2023-03-03 VITALS — BP 182/72 | HR 60 | Temp 98.2°F | Ht 60.0 in | Wt 121.4 lb

## 2023-03-03 DIAGNOSIS — K582 Mixed irritable bowel syndrome: Secondary | ICD-10-CM

## 2023-03-03 DIAGNOSIS — R140 Abdominal distension (gaseous): Secondary | ICD-10-CM

## 2023-03-03 NOTE — Patient Instructions (Addendum)
It was a pleasure to see you again today!  Continue the IBgard as well as half Imodium daily to every other day.  I will provide you some samples of Visbiome today.  This is a probiotic specifically to help with GI issues like diarrhea.  Follow up in 6 months, sooner if needed.   It was a pleasure to see you today. I want to create trusting relationships with patients. If you receive a survey regarding your visit,  I greatly appreciate you taking time to fill this out on paper or through your MyChart. I value your feedback.  Brooke Bonito, MSN, FNP-BC, AGACNP-BC [facility]

## 2023-03-05 ENCOUNTER — Ambulatory Visit: Payer: Medicare Other | Admitting: Internal Medicine

## 2023-03-07 DIAGNOSIS — H903 Sensorineural hearing loss, bilateral: Secondary | ICD-10-CM | POA: Diagnosis not present

## 2023-03-14 DIAGNOSIS — E039 Hypothyroidism, unspecified: Secondary | ICD-10-CM | POA: Diagnosis not present

## 2023-03-14 DIAGNOSIS — R7301 Impaired fasting glucose: Secondary | ICD-10-CM | POA: Diagnosis not present

## 2023-03-14 DIAGNOSIS — N183 Chronic kidney disease, stage 3 unspecified: Secondary | ICD-10-CM | POA: Diagnosis not present

## 2023-03-14 DIAGNOSIS — E7849 Other hyperlipidemia: Secondary | ICD-10-CM | POA: Diagnosis not present

## 2023-03-19 DIAGNOSIS — E039 Hypothyroidism, unspecified: Secondary | ICD-10-CM | POA: Diagnosis not present

## 2023-03-19 DIAGNOSIS — R7301 Impaired fasting glucose: Secondary | ICD-10-CM | POA: Diagnosis not present

## 2023-03-19 DIAGNOSIS — I10 Essential (primary) hypertension: Secondary | ICD-10-CM | POA: Diagnosis not present

## 2023-03-19 DIAGNOSIS — E871 Hypo-osmolality and hyponatremia: Secondary | ICD-10-CM | POA: Diagnosis not present

## 2023-03-19 DIAGNOSIS — G3184 Mild cognitive impairment, so stated: Secondary | ICD-10-CM | POA: Diagnosis not present

## 2023-03-19 DIAGNOSIS — K2100 Gastro-esophageal reflux disease with esophagitis, without bleeding: Secondary | ICD-10-CM | POA: Diagnosis not present

## 2023-03-19 DIAGNOSIS — D649 Anemia, unspecified: Secondary | ICD-10-CM | POA: Diagnosis not present

## 2023-03-19 DIAGNOSIS — E7849 Other hyperlipidemia: Secondary | ICD-10-CM | POA: Diagnosis not present

## 2023-03-19 DIAGNOSIS — M419 Scoliosis, unspecified: Secondary | ICD-10-CM | POA: Diagnosis not present

## 2023-03-19 DIAGNOSIS — N183 Chronic kidney disease, stage 3 unspecified: Secondary | ICD-10-CM | POA: Diagnosis not present

## 2023-03-19 DIAGNOSIS — R609 Edema, unspecified: Secondary | ICD-10-CM | POA: Diagnosis not present

## 2023-04-07 DIAGNOSIS — Z471 Aftercare following joint replacement surgery: Secondary | ICD-10-CM | POA: Diagnosis not present

## 2023-04-07 DIAGNOSIS — Z96611 Presence of right artificial shoulder joint: Secondary | ICD-10-CM | POA: Diagnosis not present

## 2023-04-07 DIAGNOSIS — M25512 Pain in left shoulder: Secondary | ICD-10-CM | POA: Diagnosis not present

## 2023-05-29 DIAGNOSIS — H401233 Low-tension glaucoma, bilateral, severe stage: Secondary | ICD-10-CM | POA: Diagnosis not present

## 2023-05-29 DIAGNOSIS — H5213 Myopia, bilateral: Secondary | ICD-10-CM | POA: Diagnosis not present

## 2023-06-18 IMAGING — DX DG HIP (WITH OR WITHOUT PELVIS) 2-3V*L*
3 series · 3 of 3 positions shown · non-contrast
Comparison: None

CLINICAL DATA: Pain, inability to walk in a 80-year-old female.
Fall, landing on concrete floor.

EXAM:
DG HIP (WITH OR WITHOUT PELVIS) 2-3V LEFT

[pelvis ap]
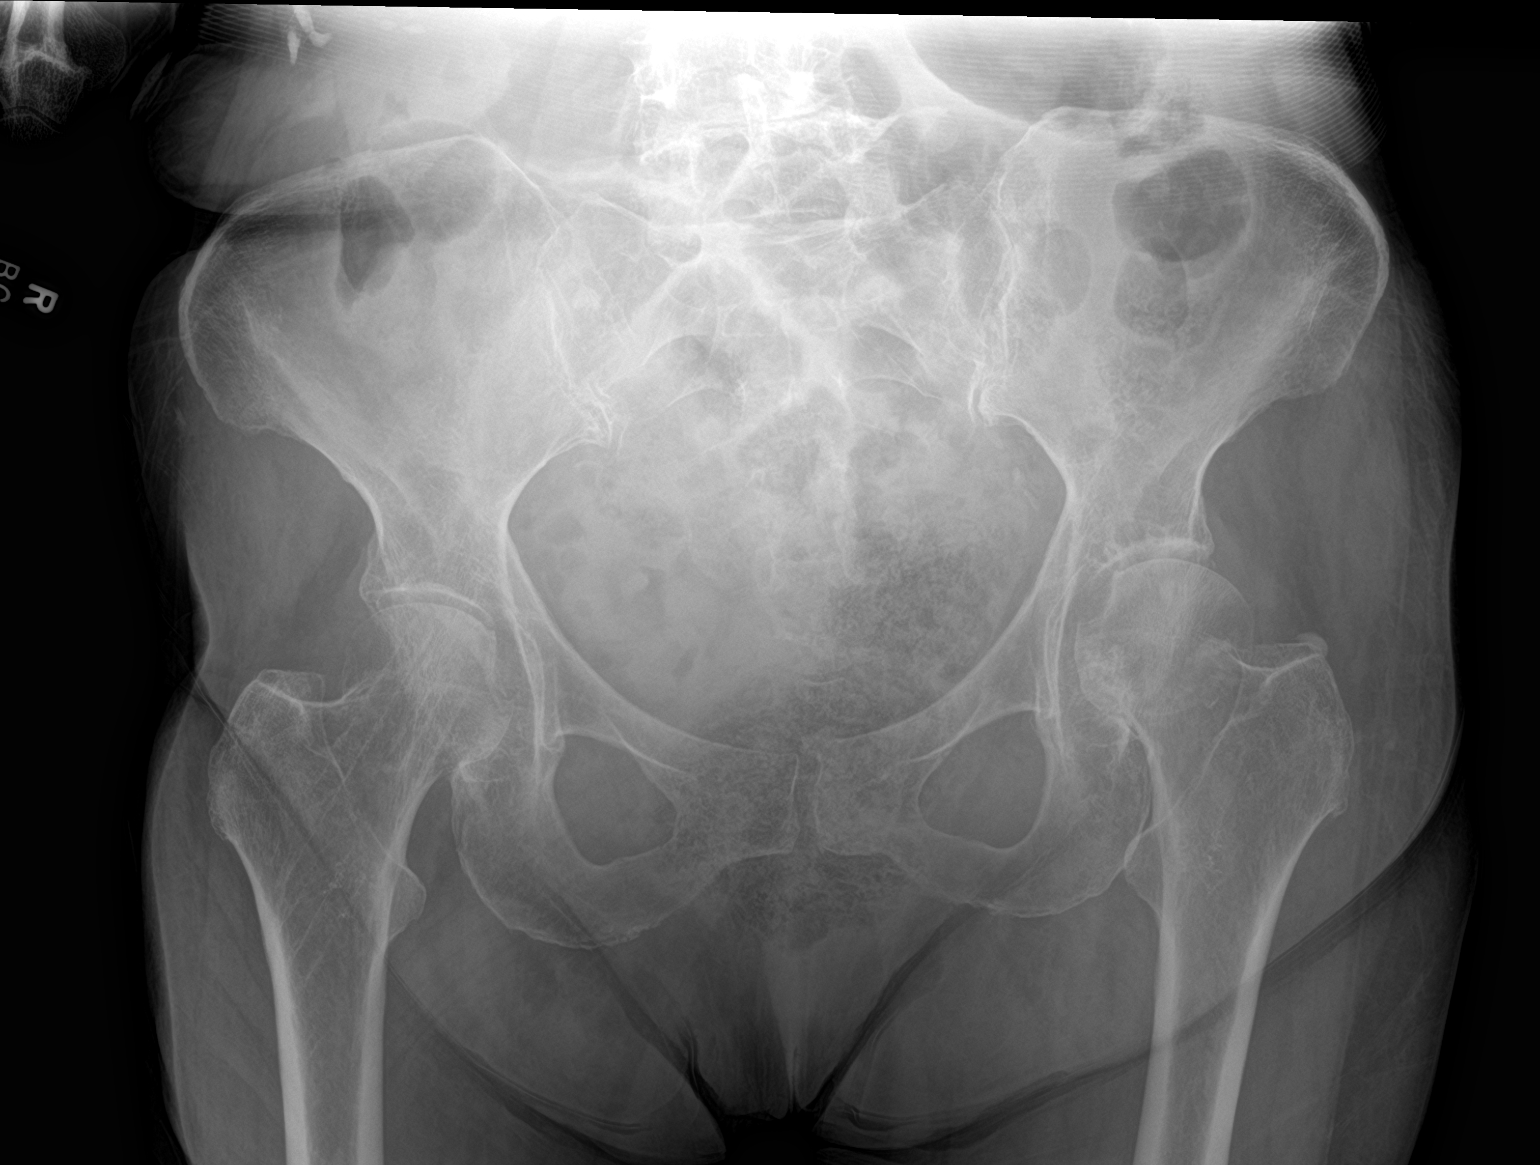

[hip ap]
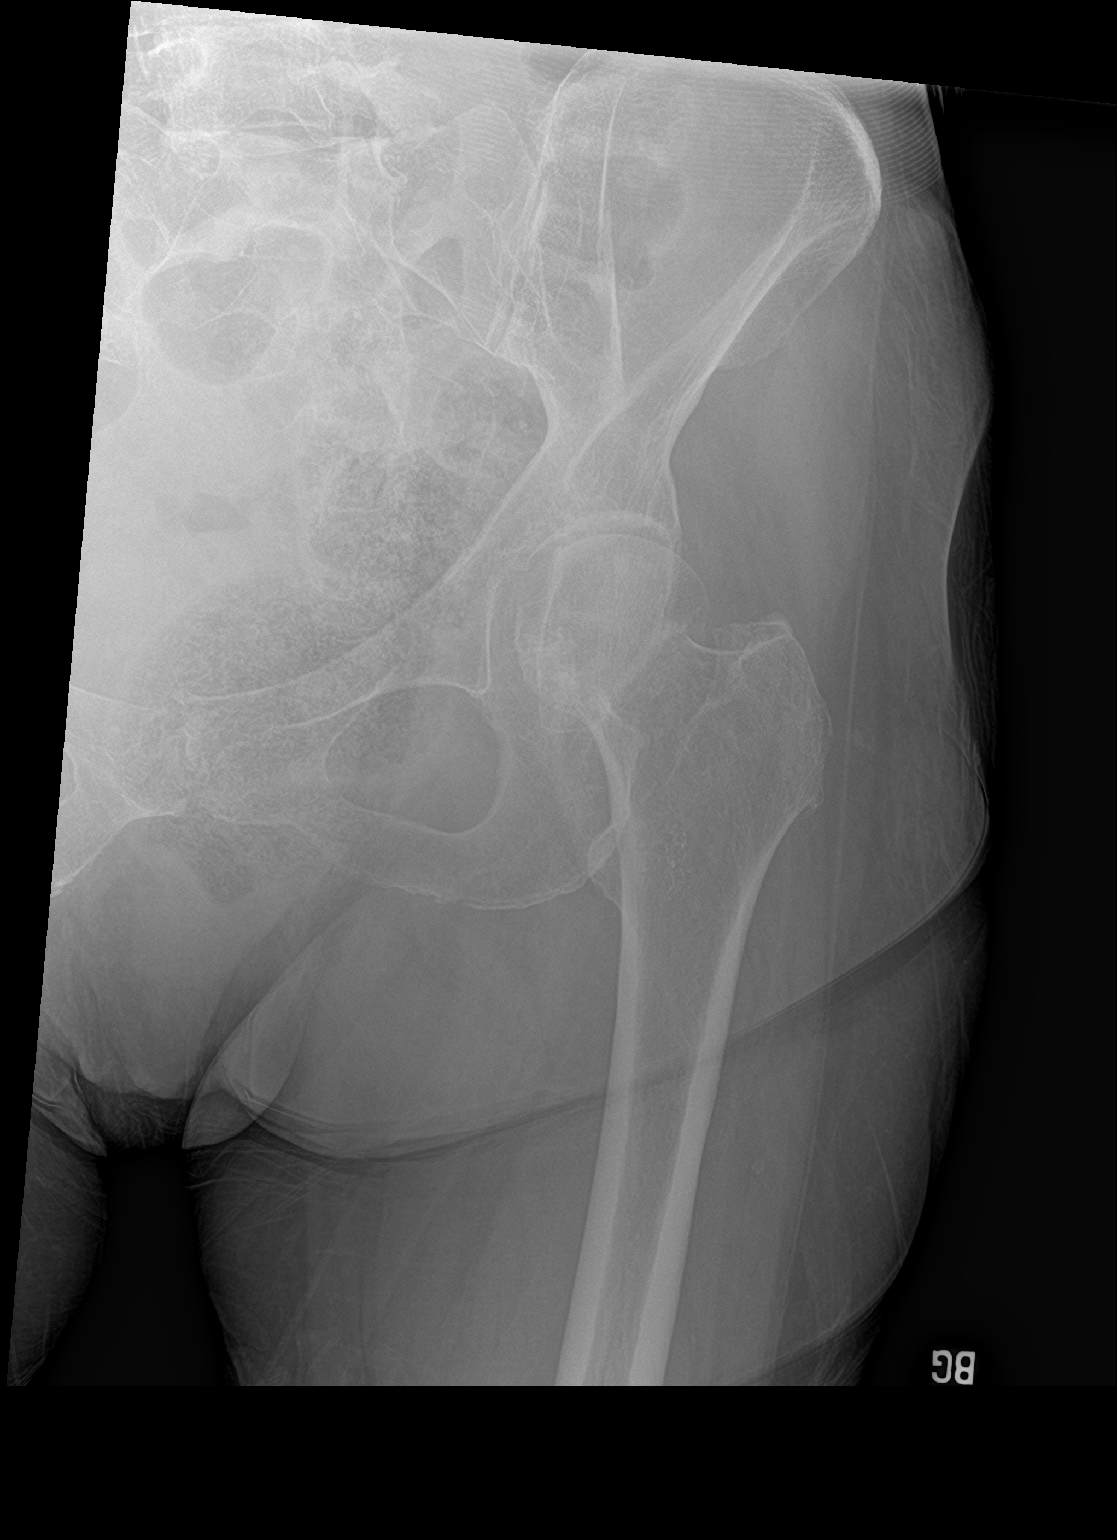

[hip lat]
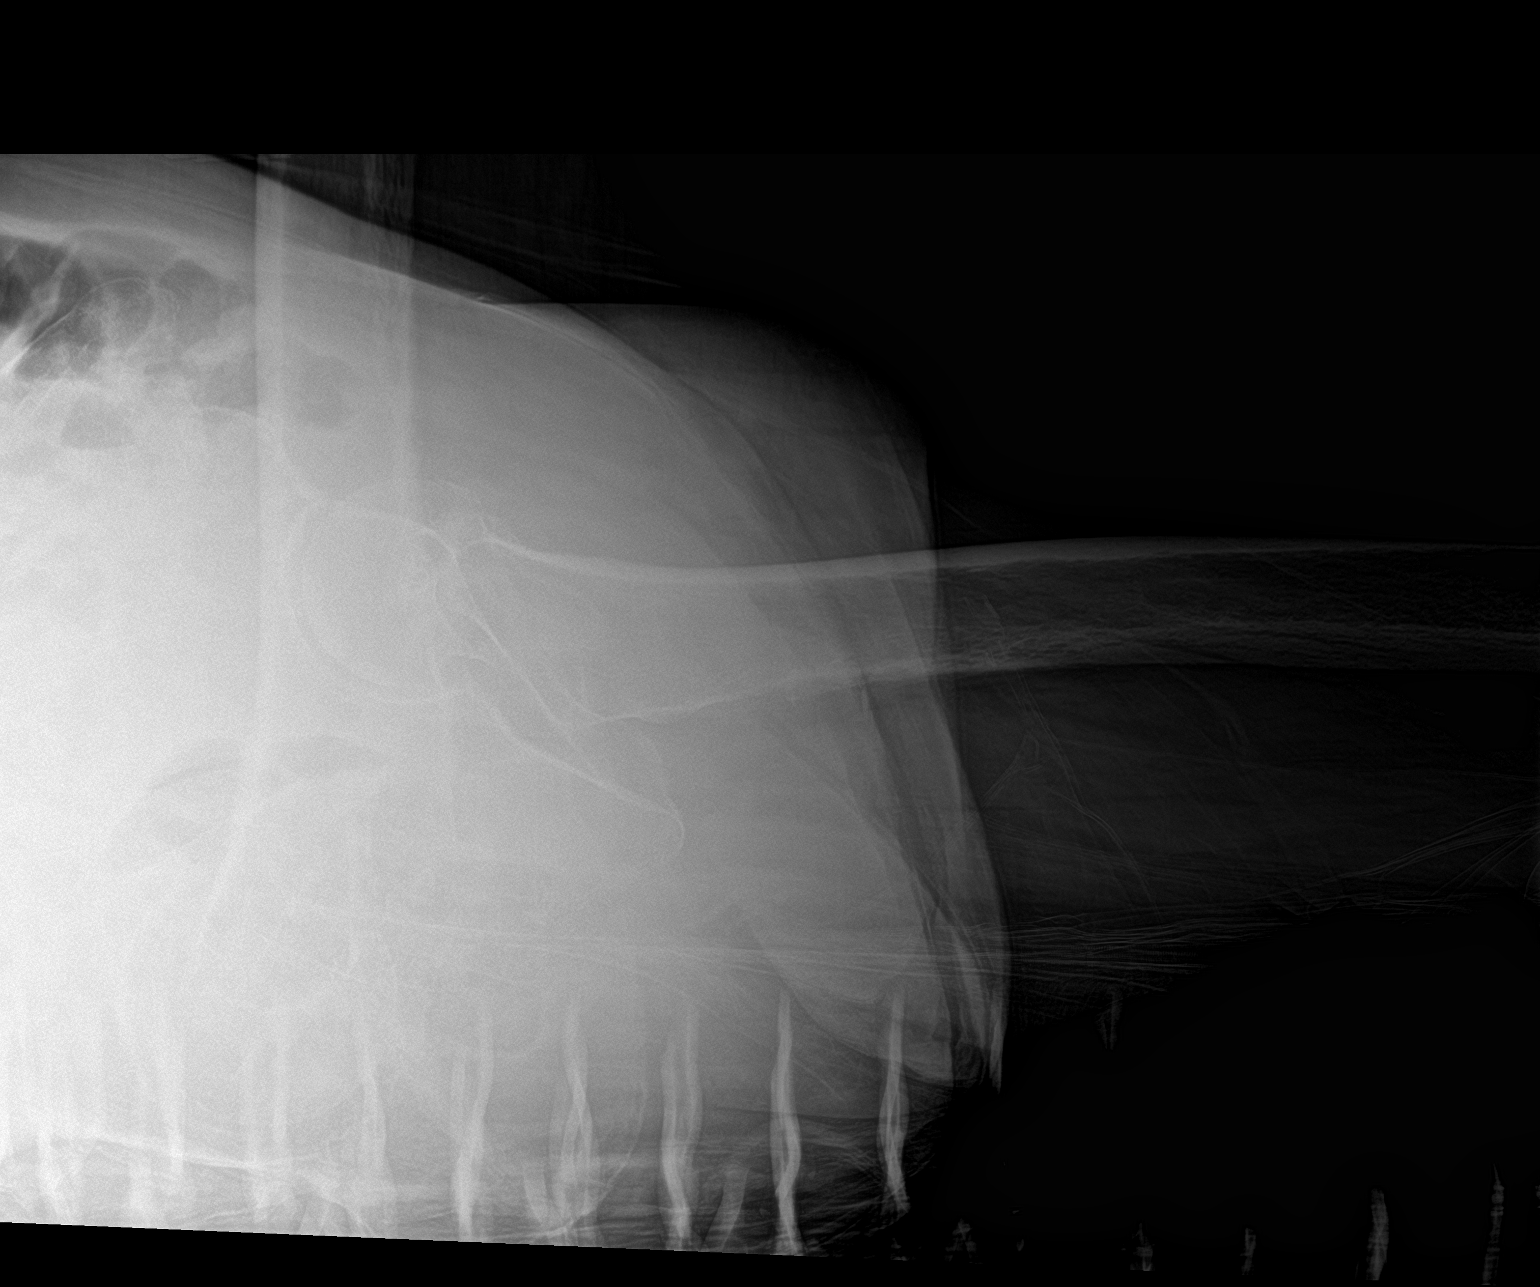

[3 of 3 positions shown; findings below may reference images not displayed]

FINDINGS: Marked osteopenia. Pin tracts in the RIGHT proximal femur with some
deformity of the RIGHT proximal femur presumably from prior ORIF of
previous fracture.

Mildly displaced LEFT subcapital femoral neck fracture. Cross-table
lateral with mild anterior displacement, impaction and apex anterior
angulation.

No acute fracture of the bony pelvis.
IMPRESSION: Mildly displaced, angulated, impacted LEFT subcapital femoral neck
fracture.

Signs of prior ORIF of the RIGHT proximal femur with mild deformity
also on this side, findings suggest previous fracture in this
location, correlate with any pain in this area.

## 2023-06-19 IMAGING — DX DG HIP (WITH OR WITHOUT PELVIS) 2-3V*L*
3 series · 3 of 3 positions shown · non-contrast
Comparison: 10/19/2021.

CLINICAL DATA: Left hip hemiarthroplasty.

EXAM:
DG HIP (WITH OR WITHOUT PELVIS) 2-3V LEFT

[pelvis ap]
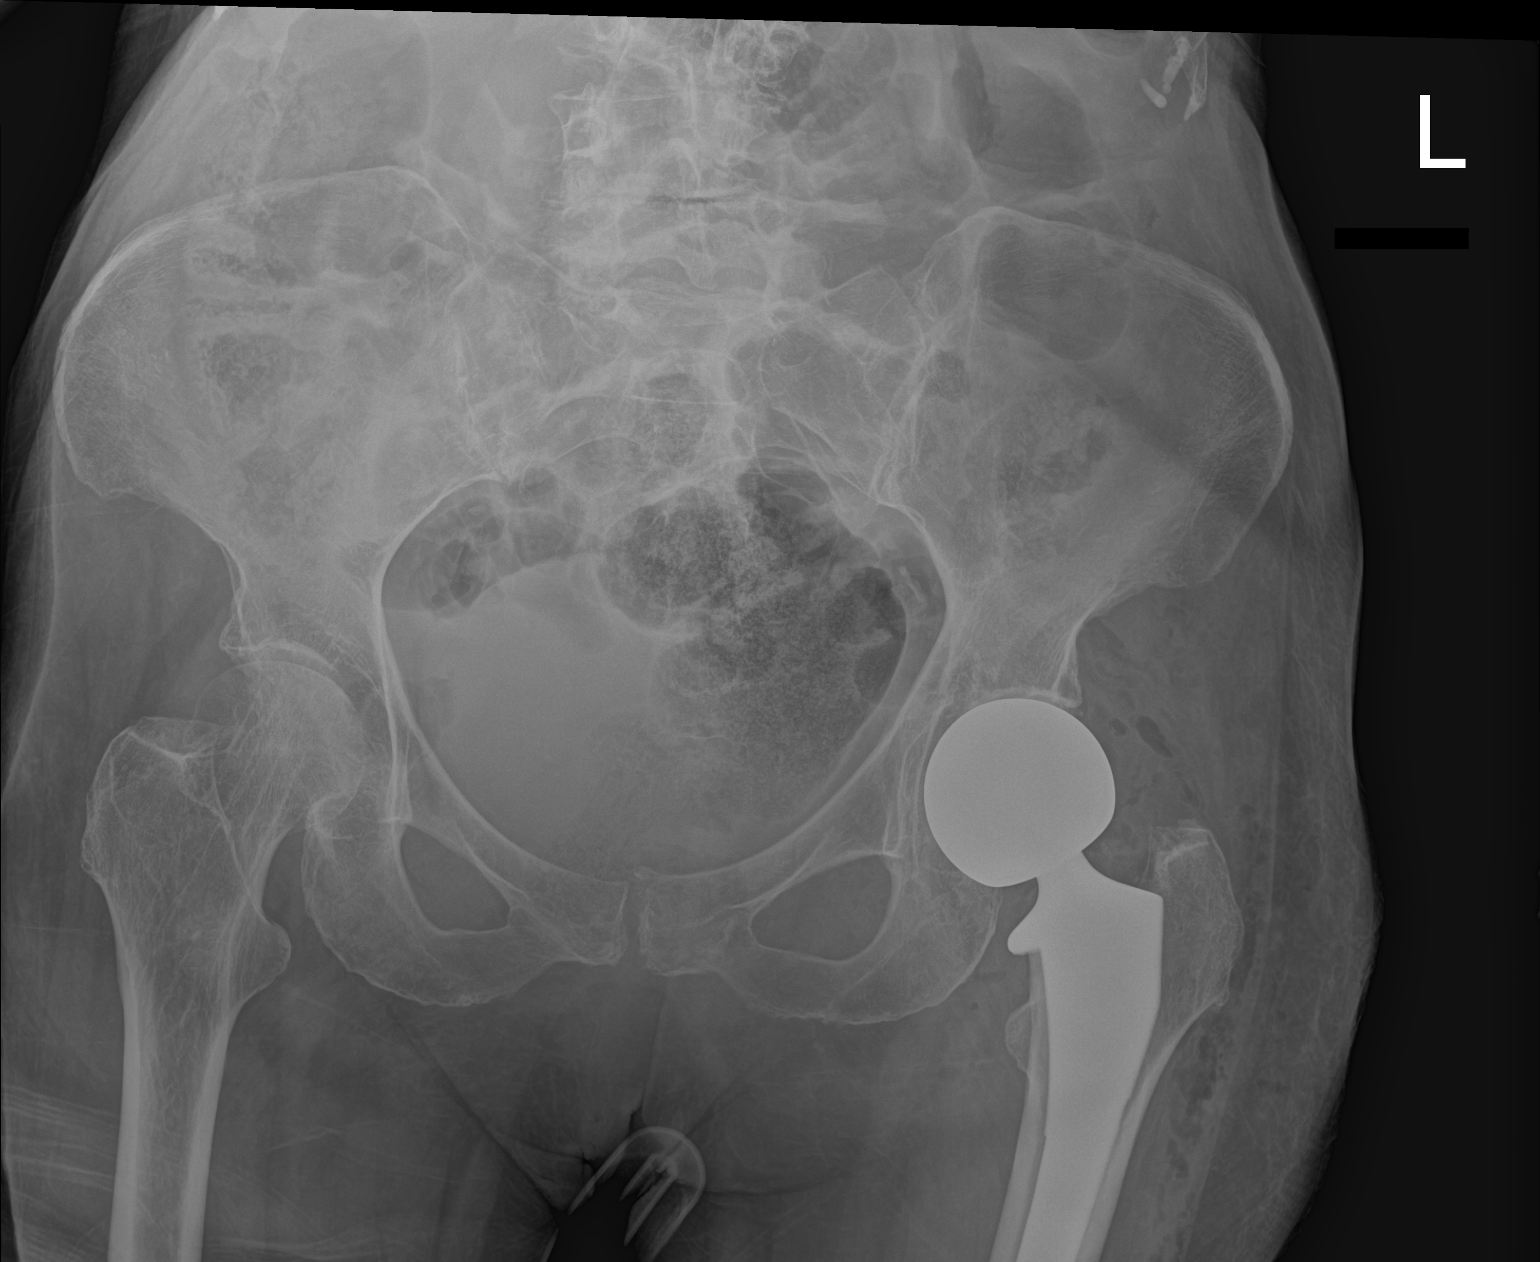

[hip ap]
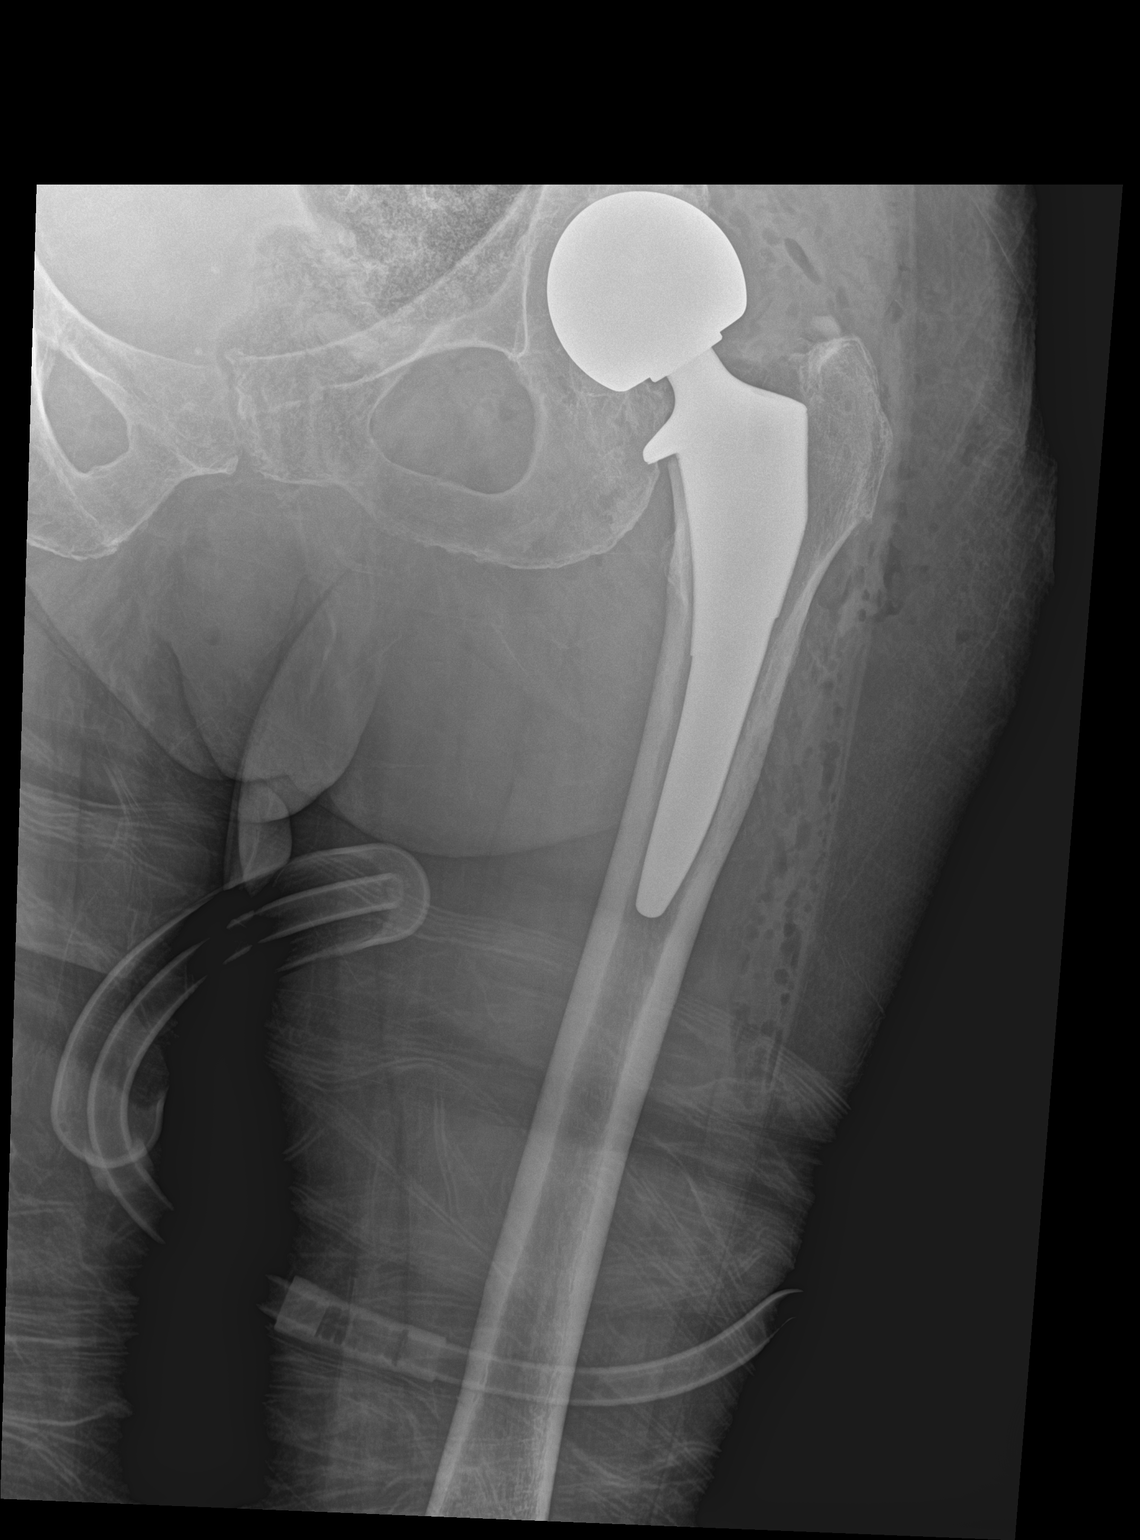

[hip lat]
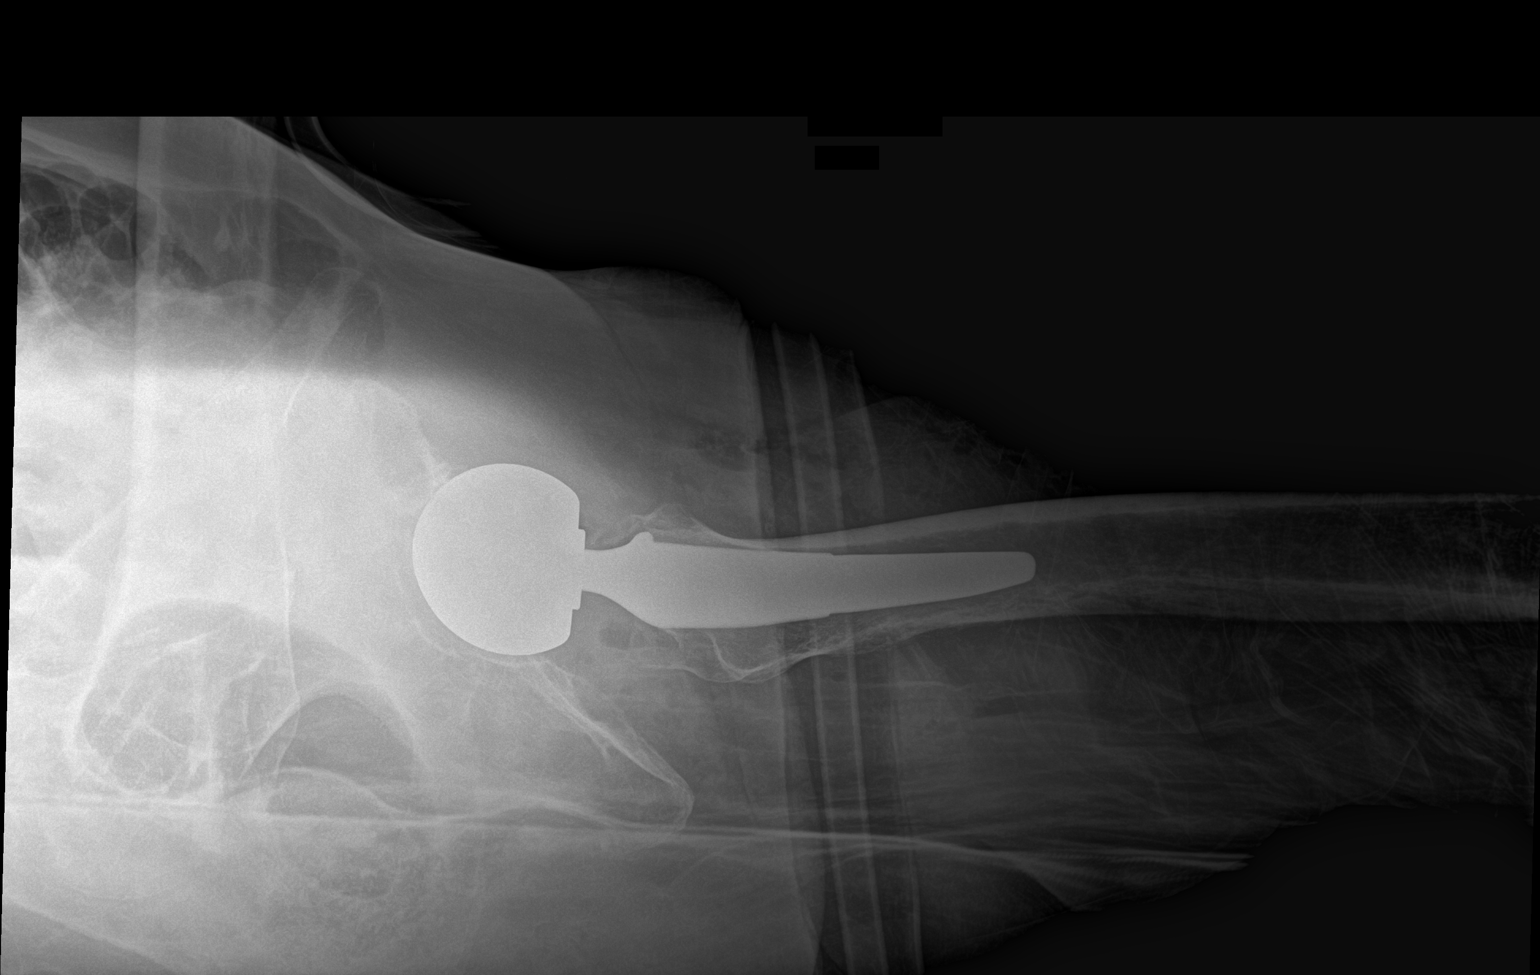

[3 of 3 positions shown; findings below may reference images not displayed]

FINDINGS: Left hip hemiarthroplasty appears well seated and aligned. There are
adjacent postoperative soft tissue changes. No acute fracture or
evidence of an operative complication.
IMPRESSION: Well-positioned left hip hemiarthroplasty.

## 2023-06-19 IMAGING — DX DG PORTABLE PELVIS
1 series · 1 of 1 positions shown · non-contrast
Comparison: 10/19/2021.  09/19/2020.

CLINICAL DATA: Postop left hip hemiarthroplasty.

EXAM:
PORTABLE PELVIS 1-2 VIEWS

[pelvis ap]
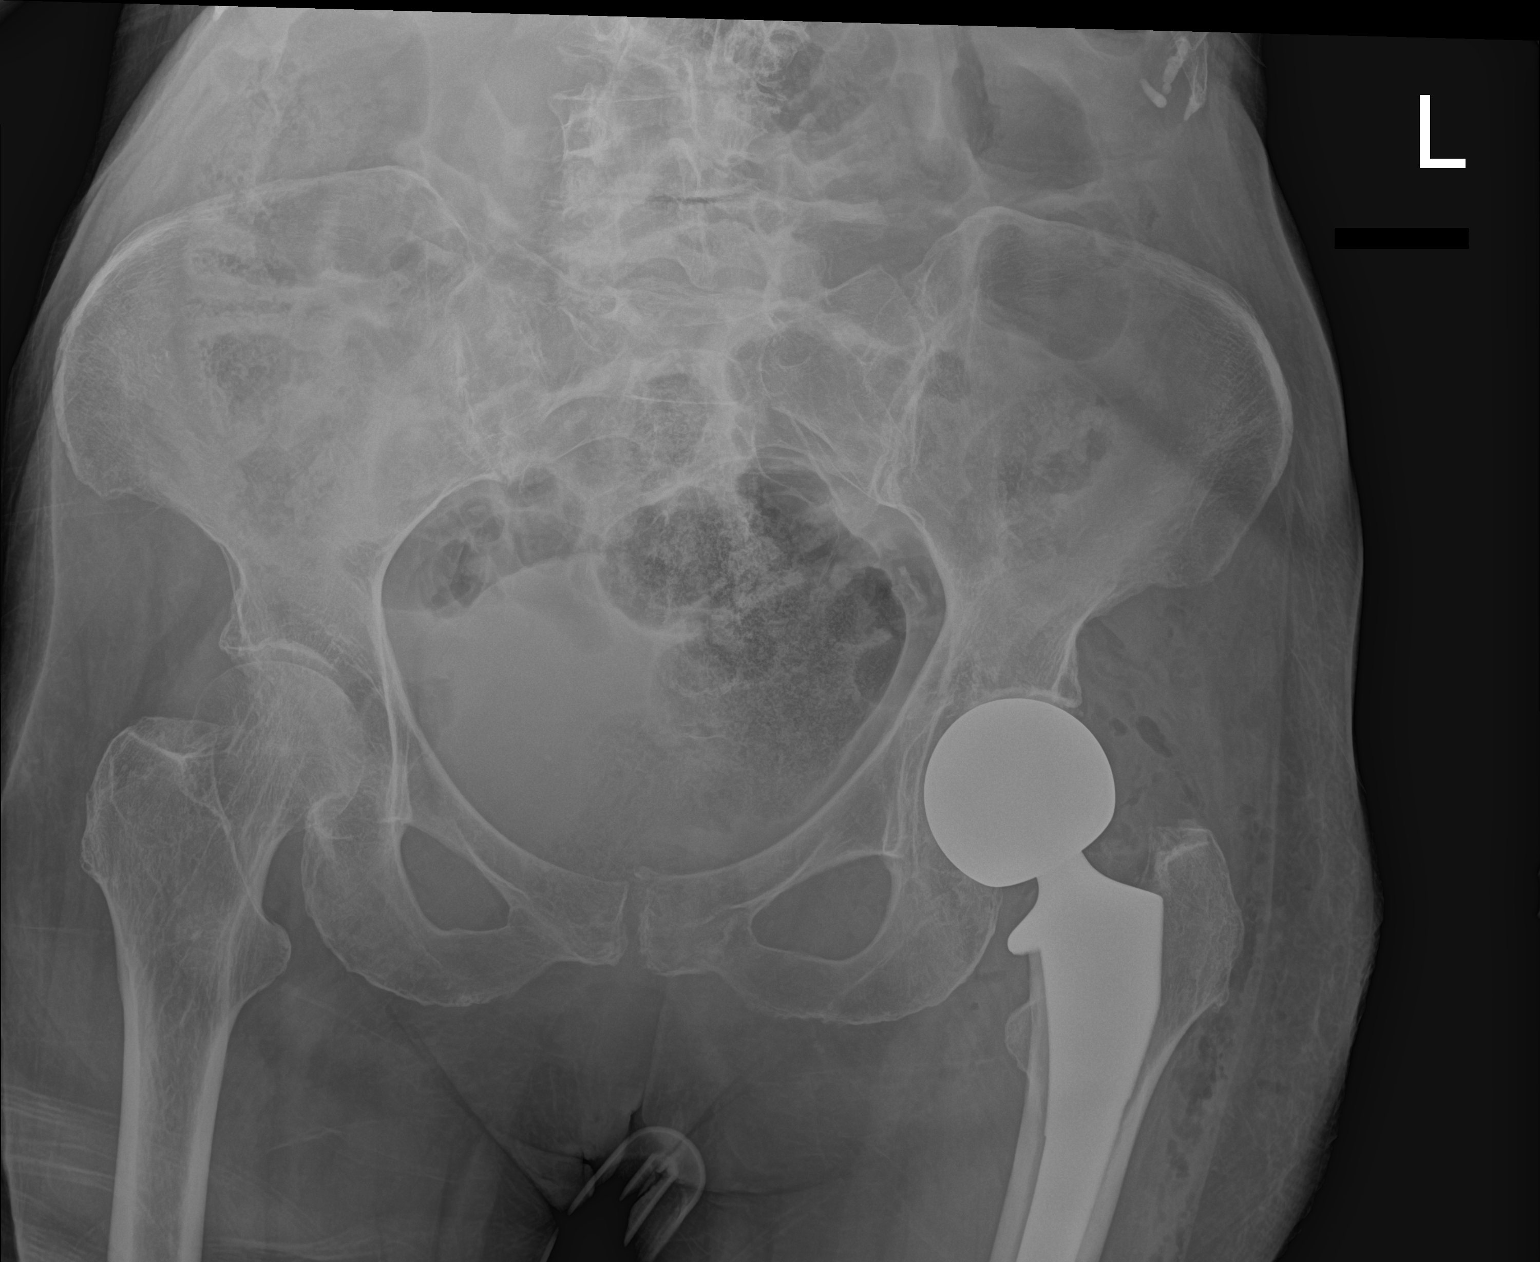

[1 of 1 positions shown; findings below may reference images not displayed]

FINDINGS: New left proximal femur hemiarthroplasty appears well seated and
aligned.

No acute fracture or evidence of an operative complication.

Parallel linear areas of sclerosis in the proximal right femur
consistent with tracks from prior screws, stable.

Right hip joint, SI joints and symphysis pubis are normally aligned.

Postoperative soft tissue edema and air adjacent to the left
hip/proximal femur.
IMPRESSION: 1. Well-positioned left hip hemiarthroplasty.
2. No acute fracture or evidence of an operative complication.

## 2023-07-11 ENCOUNTER — Encounter: Payer: Self-pay | Admitting: Urology

## 2023-07-11 ENCOUNTER — Ambulatory Visit: Payer: Medicare Other | Admitting: Urology

## 2023-07-11 VITALS — BP 191/72 | HR 69

## 2023-07-11 DIAGNOSIS — I10 Essential (primary) hypertension: Secondary | ICD-10-CM | POA: Diagnosis not present

## 2023-07-11 DIAGNOSIS — N3021 Other chronic cystitis with hematuria: Secondary | ICD-10-CM | POA: Diagnosis not present

## 2023-07-11 DIAGNOSIS — Z6822 Body mass index (BMI) 22.0-22.9, adult: Secondary | ICD-10-CM | POA: Diagnosis not present

## 2023-07-11 LAB — URINALYSIS, ROUTINE W REFLEX MICROSCOPIC
Bilirubin, UA: NEGATIVE
Glucose, UA: NEGATIVE
Ketones, UA: NEGATIVE
Leukocytes,UA: NEGATIVE
Nitrite, UA: NEGATIVE
Protein,UA: NEGATIVE
RBC, UA: NEGATIVE
Specific Gravity, UA: 1.01 (ref 1.005–1.030)
Urobilinogen, Ur: 0.2 mg/dL (ref 0.2–1.0)
pH, UA: 6 (ref 5.0–7.5)

## 2023-07-11 MED ORDER — NITROFURANTOIN MACROCRYSTAL 50 MG PO CAPS: 50.0000 mg | ORAL_CAPSULE | Freq: Every day | ORAL | 3 refills | Status: DC

## 2023-07-11 NOTE — Patient Instructions (Signed)
Urinary Tract Infection, Adult  A urinary tract infection (UTI) is an infection of any part of the urinary tract. The urinary tract includes the kidneys, ureters, bladder, and urethra. These organs make, store, and get rid of urine in the body. An upper UTI affects the ureters and kidneys. A lower UTI affects the bladder and urethra. What are the causes? Most urinary tract infections are caused by bacteria in your genital area around your urethra, where urine leaves your body. These bacteria grow and cause inflammation of your urinary tract. What increases the risk? You are more likely to develop this condition if: You have a urinary catheter that stays in place. You are not able to control when you urinate or have a bowel movement (incontinence). You are female and you: Use a spermicide or diaphragm for birth control. Have low estrogen levels. Are pregnant. You have certain genes that increase your risk. You are sexually active. You take antibiotic medicines. You have a condition that causes your flow of urine to slow down, such as: An enlarged prostate, if you are female. Blockage in your urethra. A kidney stone. A nerve condition that affects your bladder control (neurogenic bladder). Not getting enough to drink, or not urinating often. You have certain medical conditions, such as: Diabetes. A weak disease-fighting system (immunesystem). Sickle cell disease. Gout. Spinal cord injury. What are the signs or symptoms? Symptoms of this condition include: Needing to urinate right away (urgency). Frequent urination. This may include small amounts of urine each time you urinate. Pain or burning with urination. Blood in the urine. Urine that smells bad or unusual. Trouble urinating. Cloudy urine. Vaginal discharge, if you are female. Pain in the abdomen or the lower back. You may also have: Vomiting or a decreased appetite. Confusion. Irritability or tiredness. A fever or  chills. Diarrhea. The first symptom in older adults may be confusion. In some cases, they may not have any symptoms until the infection has worsened. How is this diagnosed? This condition is diagnosed based on your medical history and a physical exam. You may also have other tests, including: Urine tests. Blood tests. Tests for STIs (sexually transmitted infections). If you have had more than one UTI, a cystoscopy or imaging studies may be done to determine the cause of the infections. How is this treated? Treatment for this condition includes: Antibiotic medicine. Over-the-counter medicines to treat discomfort. Drinking enough water to stay hydrated. If you have frequent infections or have other conditions such as a kidney stone, you may need to see a health care provider who specializes in the urinary tract (urologist). In rare cases, urinary tract infections can cause sepsis. Sepsis is a life-threatening condition that occurs when the body responds to an infection. Sepsis is treated in the hospital with IV antibiotics, fluids, and other medicines. Follow these instructions at home:  Medicines Take over-the-counter and prescription medicines only as told by your health care provider. If you were prescribed an antibiotic medicine, take it as told by your health care provider. Do not stop using the antibiotic even if you start to feel better. General instructions Make sure you: Empty your bladder often and completely. Do not hold urine for long periods of time. Empty your bladder after sex. Wipe from front to back after urinating or having a bowel movement if you are female. Use each tissue only one time when you wipe. Drink enough fluid to keep your urine pale yellow. Keep all follow-up visits. This is important. Contact a health   care provider if: Your symptoms do not get better after 1-2 days. Your symptoms go away and then return. Get help right away if: You have severe pain in  your back or your lower abdomen. You have a fever or chills. You have nausea or vomiting. Summary A urinary tract infection (UTI) is an infection of any part of the urinary tract, which includes the kidneys, ureters, bladder, and urethra. Most urinary tract infections are caused by bacteria in your genital area. Treatment for this condition often includes antibiotic medicines. If you were prescribed an antibiotic medicine, take it as told by your health care provider. Do not stop using the antibiotic even if you start to feel better. Keep all follow-up visits. This is important. This information is not intended to replace advice given to you by your health care provider. Make sure you discuss any questions you have with your health care provider. Document Revised: 02/20/2020 Document Reviewed: 02/25/2020 Elsevier Patient Education  2024 Elsevier Inc.  

## 2023-07-11 NOTE — Progress Notes (Signed)
07/11/2023 11:19 AM   Rebekah King 11-May-1941 [id number]  Referring provider: Estanislado Pandy, MD [address] [place],  [place] [address]  Frequent UTI   HPI: Rebekah King is a 82yo here for followup for frequent UTI. She remains on macrodantin 50mg  at bedtime. NO UTIs since last visit. NO significant LUTS. UA today is normal.    PMH: Past Medical History:  Diagnosis Date   Acute postoperative anemia due to expected blood loss 10/22/2021   Arthritis    Complication of anesthesia    CLAUSTROPHOBIC- TROUBLE WITH ANYTHING OVER FACE   GERD (gastroesophageal reflux disease)    Glaucoma    Hypothyroidism    UTI (urinary tract infection)     Surgical History: Past Surgical History:  Procedure Laterality Date   ABDOMINAL HYSTERECTOMY     CATARACT EXTRACTION W/ INTRAOCULAR LENS  IMPLANT, BILATERAL     HERNIA REPAIR     HIP ARTHROPLASTY Left 10/20/2021   Procedure: ARTHROPLASTY BIPOLAR HIP (HEMIARTHROPLASTY);  Surgeon: Frederico Hamman, MD;  Location: [facility];  Service: Orthopedics;  Laterality: Left;   HIP FRACTURE SURGERY      RIGHT    (2ND SURGERY TO REMOVE PINS)   REFRACTIVE SURGERY     RETINAL DETACHMENT SURGERY     RIGHT   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   Procedure: TOTAL KNEE ARTHROPLASTY;  Surgeon: Frederico Hamman, MD;  Location: [facility];  Service: Orthopedics;  Laterality: Right;   TOTAL SHOULDER ARTHROPLASTY Right 11/25/2019   Procedure: TOTAL SHOULDER ARTHROPLASTY;  Surgeon: Francena Hanly, MD;  Location: [facility];  Service: Orthopedics;  Laterality: Right;     Home Medications:  Allergies as of 07/11/2023       Reactions   Brimonidine Other (See Comments)   Severe dry mouth   Timolol Other (See Comments)   Lightheaded   Beef-derived Drug Products    After being bitten by tick   Misc. Sulfonamide Containing Compounds    Amoxicillin-pot Clavulanate Diarrhea   GI pain   Sulfa Antibiotics Nausea Only, Rash   Hands go numb        Medication  List        Accurate as of July 11, 2023 11:19 AM. If you have any questions, ask your nurse or doctor.          STOP taking these medications    dicyclomine 10 MG capsule Commonly known as: BENTYL   Ginger Root 550 MG Caps       TAKE these medications    acetaminophen 500 MG tablet Commonly known as: TYLENOL Take 1,000 mg by mouth every 6 (six) hours as needed.   CALCIUM 600+D3 PO Take 1 tablet by mouth daily at 6 (six) AM.   clopidogrel 75 MG tablet Commonly known as: PLAVIX Take 1 tablet (75 mg total) by mouth daily.   dorzolamide 2 % ophthalmic solution Commonly known as: TRUSOPT Place 1 drop into both eyes 3 (three) times daily.   hydrALAZINE 10 MG tablet Commonly known as: APRESOLINE Take 10 mg by mouth 2 (two) times daily.   IBGARD PO Take by mouth.   levothyroxine 50 MCG tablet Commonly known as: SYNTHROID Take 50 mcg by mouth every evening.   losartan 100 MG tablet Commonly known as: COZAAR Take 100 mg by mouth daily.   meloxicam 15 MG tablet Commonly known as: MOBIC Take 15 mg by mouth daily.   multivitamin with minerals Tabs tablet Take 1 tablet by mouth  daily.   nitrofurantoin 50 MG capsule Commonly known as: MACRODANTIN Take 1 capsule (50 mg total) by mouth at bedtime.   pantoprazole 40 MG tablet Commonly known as: PROTONIX Take 40 mg by mouth 2 (two) times daily.   PROBIOTIC DAILY PO Take by mouth.   simvastatin 20 MG tablet Commonly known as: ZOCOR Take 20 mg by mouth every evening.   SYSTANE OP Apply to eye. 4 daily        Allergies:  Allergies  Allergen Reactions   Brimonidine Other (See Comments)    Severe dry mouth   Timolol Other (See Comments)    Lightheaded   Beef-Derived Drug Products     After being bitten by tick   Misc. Sulfonamide Containing Compounds    Amoxicillin-Pot Clavulanate Diarrhea    GI pain     Sulfa Antibiotics Nausea Only and Rash    Hands go numb    Family History: No  family history on file.  Social History:  reports that she has never smoked. She has never been exposed to tobacco smoke. She has never used smokeless tobacco. She reports that she does not drink alcohol and does not use drugs.  ROS: All other review of systems were reviewed and are negative except what is noted above in HPI  Physical Exam: There were no vitals taken for this visit.  Constitutional:  Alert and oriented, No acute distress. HEENT: [place] AT, moist mucus membranes.  Trachea midline, no masses. Cardiovascular: No clubbing, cyanosis, or edema. Respiratory: Normal respiratory effort, no increased work of breathing. GI: Abdomen is soft, nontender, nondistended, no abdominal masses GU: No CVA tenderness.  Lymph: No cervical or inguinal lymphadenopathy. Skin: No rashes, bruises or suspicious lesions. Neurologic: Grossly intact, no focal deficits, moving all 4 extremities. Psychiatric: Normal mood and affect.  Laboratory Data: Lab Results  Component Value Date   WBC 7.4 10/23/2021   HGB 9.6 (L) 10/23/2021   HCT 27.5 (L) 10/23/2021   MCV 94.2 10/23/2021   PLT 173 10/23/2021    Lab Results  Component Value Date   CREATININE 0.57 10/22/2021    No results found for: "PSA"  No results found for: "TESTOSTERONE"  Lab Results  Component Value Date   HGBA1C 5.4 09/03/2021    Urinalysis    Component Value Date/Time   COLORURINE AMBER (A) 03/25/2021 2139   APPEARANCEUR Clear 07/12/2022 1146   LABSPEC 1.009 03/25/2021 2139   PHURINE 5.0 03/25/2021 2139   GLUCOSEU Negative 07/12/2022 1146   HGBUR SMALL (A) 03/25/2021 2139   BILIRUBINUR Negative 07/12/2022 1146   KETONESUR NEGATIVE 03/25/2021 2139   PROTEINUR 1+ (A) 07/12/2022 1146   PROTEINUR NEGATIVE 03/25/2021 2139   UROBILINOGEN negative (A) 09/01/2019 1100   NITRITE Negative 07/12/2022 1146   NITRITE POSITIVE (A) 03/25/2021 2139   LEUKOCYTESUR Negative 07/12/2022 1146   LEUKOCYTESUR NEGATIVE 03/25/2021 2139     Lab Results  Component Value Date   LABMICR See below: 07/12/2022   WBCUA None seen 07/12/2022   LABEPIT 0-10 07/12/2022   MUCUS Present 08/23/2020   BACTERIA None seen 07/12/2022    Pertinent Imaging:  No results found for this or any previous visit.  No results found for this or any previous visit.  No results found for this or any previous visit.  No results found for this or any previous visit.  No results found for this or any previous visit.  No results found for this or any previous visit.  No results found for  this or any previous visit.  No results found for this or any previous visit.   Assessment & Plan:    1. Chronic cystitis with hematuria (Primary) continue macrodantin 50mg  qhs - Urinalysis, Routine w reflex microscopic   No follow-ups on file.  Wilkie Aye, MD  [facility] [place]

## 2023-08-13 DIAGNOSIS — Z6821 Body mass index (BMI) 21.0-21.9, adult: Secondary | ICD-10-CM | POA: Diagnosis not present

## 2023-08-13 DIAGNOSIS — J0101 Acute recurrent maxillary sinusitis: Secondary | ICD-10-CM | POA: Diagnosis not present

## 2023-09-02 ENCOUNTER — Other Ambulatory Visit: Payer: Self-pay | Admitting: Neurology

## 2023-09-03 NOTE — Progress Notes (Addendum)
 GI Office Note    Referring Provider: Atilano Deward ORN, MD Primary Care Physician:  Atilano Deward ORN, MD Primary Gastroenterologist: Toribio Rubins, MD  Date:  09/04/2023  ID:  Rebekah King, DOB 1941/01/19, MRN [id number]   Chief Complaint   Chief Complaint  Patient presents with   Follow-up    Follow up. No problems    History of Present Illness  Rebekah King is a 83 y.o. female with a history of GERD, IBS, HTN, and HLD presenting today for follow-up of IBS.  Last colonoscopy: About 25 years prior No prior EGD.   Seen in August 2023 with complaint of looser stools and a lot of gas.  Also having nocturnal stools.  Had stopped Bentyl  prior to her last visit and started back taking twice daily.  Takes half of Imodium she is going out.  Had low FODMAP diet for reference but had not performed given she felt this was just for constipation.  Having 3-4 BMs daily.  Stools typically very loose and small amounts.  Abdominal discomfort improves after BM.  Bentyl  was helpful.  She is recommended start taking Bentyl  3 times daily before meals and Benefiber 1 tablespoon 3 times daily with meals.  She declined celiac testing.  Advised to make aware if she decided she wanted to schedule colonoscopy.   Office visit 09/09/2022.  Feeling somewhat better.  Did try fiber which she states made her diarrhea worse.  Taking Bentyl  twice daily before meals and this helps some.  Tried taking it 3 times daily but did not notice much increase in results.  Continues to take a half of Imodium when going out.  Typically has a bowel movement in the morning and may have to return to the restroom shortly after 1-2 times.  Occasionally with BM at night.  Notes a lot of rumbling and noises in her belly.  Has some lower abdominal pain at times which usually resolves after defecation.  Also with gas at times but not often.  Stools are mostly soft, denies watery diarrhea.  Denied any rectal bleeding, melena, nausea,  vomiting.  Appetite is good.  Weight stable.  Tries to stay away from certain foods like coleslaw and salads as they do not agree with her.  Also having hip pain s/p hip replacement.  She is advised to continue with daily probiotic.  Continue Bentyl  twice daily before meals.  Ensure good water  intake.  Follow low FODMAP diet and good stress management.  Discussed colonoscopy however patient declined.  Continue Imodium as needed.   OV 01/06/2023.  Stools been worse on dicyclomine  twice daily.  Reports her stools have been explosive. Denies any nausea or vomiting.  Recently having vision troubles.  Taking Imodium half a tablet on bad days or if she is going somewhere.  This is usually about once or twice per week.  Wakes up 1-2 times a night with stools.  Around 6 AM she has more explosive stools followed by small loose pieces as well as urgency.  This is painful at times and has many accidents with messing up her clothing.  Taking daily probiotic.  Advised to continue Bentyl  and reduce to once daily.  Schedule Imodium half tablet once daily continue probiotic.  Advised to trial IBgard.  Consider Xifaxan if current regimen ineffective.  Last office visit 03/03/23.  Began having blurry vision secondary to dicyclomine , improved after stopping.  IBgard controlling her gas very well.  Taking half Imodium every other day, sometimes  needing it daily.  Usually has a bowel movement first thing in the morning and then goes again after breakfast.  Pleased with her current state.  Admitted to salivating elevated use as well as cabbage and cooked cabbage making her diarrhea worse.  Waking up less than nighttime for bowel movements.  Taking ginger root daily.  Advised to continue IBgard and half Imodium daily to every other day.  Continue probiotic.  Advised on trial of Visbiome.  Advised we could still consider Xifaxan in the future if symptoms were to worsen.  Today:  Has been working on her diet and she has found that  stress is a big trigger for her. During all this stress she was going more and then after that she was doing better. She is taking the Ibgard three times a day (1 in the morning and 2 at night). Takes 1/2 imodium in the mornings. She did try the visbriome and liked that but it was too expensive. Has anywhere from 1-3 BM daily. At times the gas pain can be severe. Usually just 1 larger BM. She has had the most significant improvement in the gas and now she is able to do more and get out more.  No significant abdominal pain.  Has stopped the ginger.    Wt Readings from Last 3 Encounters:  09/04/23 121 lb 9.6 oz (55.2 kg)  03/03/23 121 lb 6.4 oz (55.1 kg)  01/06/23 123 lb (55.8 kg)    Current Outpatient Medications  Medication Sig Dispense Refill   acetaminophen  (TYLENOL ) 500 MG tablet Take 1,000 mg by mouth every 6 (six) hours as needed.     Calcium  Carb-Cholecalciferol (CALCIUM  600+D3 PO) Take 1 tablet by mouth daily at 6 (six) AM.     clopidogrel  (PLAVIX ) 75 MG tablet Take 1 tablet (75 mg total) by mouth daily. APPOINTMENT NEEDED FOR FURTHER REFILLS 30 tablet 1   dorzolamide  (TRUSOPT ) 2 % ophthalmic solution Place 1 drop into both eyes 3 (three) times daily.      hydrALAZINE  (APRESOLINE ) 10 MG tablet Take 10 mg by mouth 2 (two) times daily.     levothyroxine  (SYNTHROID , LEVOTHROID) 50 MCG tablet Take 50 mcg by mouth every evening.     losartan  (COZAAR ) 100 MG tablet Take 100 mg by mouth daily.     meloxicam  (MOBIC ) 15 MG tablet Take 15 mg by mouth daily.     Multiple Vitamin (MULTIVITAMIN WITH MINERALS) TABS tablet Take 1 tablet by mouth daily.     nitrofurantoin  (MACRODANTIN ) 50 MG capsule Take 1 capsule (50 mg total) by mouth at bedtime. 90 capsule 3   pantoprazole  (PROTONIX ) 40 MG tablet Take 40 mg by mouth 2 (two) times daily.     Peppermint Oil (IBGARD PO) Take by mouth.     Polyethyl Glycol-Propyl Glycol (SYSTANE OP) Apply to eye. 4 daily     Probiotic Product (PROBIOTIC DAILY PO)  Take by mouth.     simvastatin  (ZOCOR ) 20 MG tablet Take 20 mg by mouth every evening.     No current facility-administered medications for this visit.    Past Medical History:  Diagnosis Date   Acute postoperative anemia due to expected blood loss 10/22/2021   Arthritis    Complication of anesthesia    CLAUSTROPHOBIC- TROUBLE WITH ANYTHING OVER FACE   GERD (gastroesophageal reflux disease)    Glaucoma    Hypothyroidism    UTI (urinary tract infection)     Past Surgical History:  Procedure Laterality Date  ABDOMINAL HYSTERECTOMY     CATARACT EXTRACTION W/ INTRAOCULAR LENS  IMPLANT, BILATERAL     HERNIA REPAIR     HIP ARTHROPLASTY Left 10/20/2021   Procedure: ARTHROPLASTY BIPOLAR HIP (HEMIARTHROPLASTY);  Surgeon: Shari Sieving, MD;  Location: [facility];  Service: Orthopedics;  Laterality: Left;   HIP FRACTURE SURGERY      RIGHT    (2ND SURGERY TO REMOVE PINS)   REFRACTIVE SURGERY     RETINAL DETACHMENT SURGERY     RIGHT   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   Procedure: TOTAL KNEE ARTHROPLASTY;  Surgeon: Shari Sieving, MD;  Location: [facility];  Service: Orthopedics;  Laterality: Right;   TOTAL SHOULDER ARTHROPLASTY Right 11/25/2019   Procedure: TOTAL SHOULDER ARTHROPLASTY;  Surgeon: Melita Drivers, MD;  Location: [facility];  Service: Orthopedics;  Laterality: Right;     No family history on file.  Allergies as of 09/04/2023 - Review Complete 09/04/2023  Allergen Reaction Noted   Brimonidine Other (See Comments) 03/07/2020   Timolol  Other (See Comments) 03/07/2020   Beef-derived drug products  10/21/2021   Misc. sulfonamide containing compounds  06/27/2021   Amoxicillin-pot clavulanate Diarrhea 11/12/2010   Sulfa antibiotics Nausea Only and Rash 02/27/2016    Social History   Socioeconomic History   Marital status: Married    Spouse name: Not on file   Number of children: Not on file   Years of education: Not on file   Highest education level: Not on file   Occupational History   Not on file  Tobacco Use   Smoking status: Never    Passive exposure: Never   Smokeless tobacco: Never  Vaping Use   Vaping status: Never Used  Substance and Sexual Activity   Alcohol  use: No   Drug use: No   Sexual activity: Not on file  Other Topics Concern   Not on file  Social History Narrative   Not on file   Social Drivers of Health   Financial Resource Strain: Not on file  Food Insecurity: Not on file  Transportation Needs: Not on file  Physical Activity: Not on file  Stress: Not on file  Social Connections: Not on file     Review of Systems   Gen: Denies fever, chills, anorexia. Denies fatigue, weakness, weight loss.  CV: Denies chest pain, palpitations, syncope, peripheral edema, and claudication. Resp: Denies dyspnea at rest, cough, wheezing, coughing up blood, and pleurisy. GI: See HPI Derm: Denies rash, itching, dry skin Psych: Denies depression, anxiety, memory loss, confusion. No homicidal or suicidal ideation.  Heme: Denies bruising, bleeding, and enlarged lymph nodes.  Physical Exam   BP (!) 142/70 (BP Location: Left Arm, Patient Position: Sitting)   Pulse 69   Temp 97.9 F (36.6 C) (Temporal)   Ht 5' (1.524 m)   Wt 121 lb 9.6 oz (55.2 kg)   BMI 23.75 kg/m   General:   Alert and oriented. No distress noted. Pleasant and cooperative.  Head:  Normocephalic and atraumatic. Eyes:  Conjuctiva clear without scleral icterus. Mouth:  Oral mucosa pink and moist. Good dentition. No lesions. Abdomen:  +BS, soft, non-tender and non-distended. No rebound or guarding. No HSM or masses noted. Rectal: deferred Msk:  Symmetrical without gross deformities. Normal posture. Neurologic:  Alert and  oriented x4 Psych:  Alert and cooperative. Normal mood and affect.  Assessment  Rebekah King is a 83 y.o. female with a history of GERD, IBS, HTN, and HLD presenting today for follow-up of IBS.  IBS - diarrhea, gassiness: Symptoms  well-controlled with half Imodium daily, probiotics, and IBgard 3 tablets daily.  On average has 1-3 bowel movements daily, usually 1 large BM and if she has additional bowel movements they are usually very small.  Has noticed that stress frequency and urgency for symptoms.  Currently very happy with her regimen.  Discussed measures for increased frequency related to stress including increasing Imodium and/or increasing Ibgard briefly.   PLAN   Continue probiotics Continue IBgard 3 tablets daily, can increase up to 6 tablets daily. Continue to avoid trigger foods Continue half Imodium daily, increase if needed. Stress management Follow up in 1 year.   Charmaine Melia, MSN, FNP-BC, AGACNP-BC [facility]  I have reviewed the note and agree with the APP's assessment as described in this progress note  Toribio Fortune, MD Gastroenterology and Hepatology [facility]

## 2023-09-04 ENCOUNTER — Encounter: Payer: Self-pay | Admitting: Gastroenterology

## 2023-09-04 ENCOUNTER — Ambulatory Visit: Payer: Medicare Other | Admitting: Gastroenterology

## 2023-09-04 VITALS — BP 142/70 | HR 69 | Temp 97.9°F | Ht 60.0 in | Wt 121.6 lb

## 2023-09-04 DIAGNOSIS — R140 Abdominal distension (gaseous): Secondary | ICD-10-CM

## 2023-09-04 DIAGNOSIS — K580 Irritable bowel syndrome with diarrhea: Secondary | ICD-10-CM | POA: Diagnosis not present

## 2023-09-04 DIAGNOSIS — R143 Flatulence: Secondary | ICD-10-CM | POA: Diagnosis not present

## 2023-09-04 DIAGNOSIS — K582 Mixed irritable bowel syndrome: Secondary | ICD-10-CM

## 2023-09-04 NOTE — Patient Instructions (Addendum)
 Continue your probiotics, Ibgard, and Imodium.  During times of stress if you need extra Imodium that would be fine to control your symptoms.  Also if you need more frequent IBgard you can take up to 6 tablets/day.  Will plan to follow-up in 1 year, sooner if needed.  It was a pleasure to see you today. I want to create trusting relationships with patients. If you receive a survey regarding your visit,  I greatly appreciate you taking time to fill this out on paper or through your MyChart. I value your feedback.  Charmaine Melia, MSN, FNP-BC, AGACNP-BC [facility]

## 2023-09-09 ENCOUNTER — Ambulatory Visit (INDEPENDENT_AMBULATORY_CARE_PROVIDER_SITE_OTHER): Payer: Medicare Other | Admitting: Gastroenterology

## 2023-09-19 DIAGNOSIS — K2100 Gastro-esophageal reflux disease with esophagitis, without bleeding: Secondary | ICD-10-CM | POA: Diagnosis not present

## 2023-09-19 DIAGNOSIS — D649 Anemia, unspecified: Secondary | ICD-10-CM | POA: Diagnosis not present

## 2023-09-19 DIAGNOSIS — E875 Hyperkalemia: Secondary | ICD-10-CM | POA: Diagnosis not present

## 2023-09-19 DIAGNOSIS — E039 Hypothyroidism, unspecified: Secondary | ICD-10-CM | POA: Diagnosis not present

## 2023-09-19 DIAGNOSIS — Z13220 Encounter for screening for lipoid disorders: Secondary | ICD-10-CM | POA: Diagnosis not present

## 2023-09-24 DIAGNOSIS — E871 Hypo-osmolality and hyponatremia: Secondary | ICD-10-CM | POA: Diagnosis not present

## 2023-09-24 DIAGNOSIS — E039 Hypothyroidism, unspecified: Secondary | ICD-10-CM | POA: Diagnosis not present

## 2023-09-24 DIAGNOSIS — K2100 Gastro-esophageal reflux disease with esophagitis, without bleeding: Secondary | ICD-10-CM | POA: Diagnosis not present

## 2023-09-24 DIAGNOSIS — Z0001 Encounter for general adult medical examination with abnormal findings: Secondary | ICD-10-CM | POA: Diagnosis not present

## 2023-09-24 DIAGNOSIS — Z6821 Body mass index (BMI) 21.0-21.9, adult: Secondary | ICD-10-CM | POA: Diagnosis not present

## 2023-09-24 DIAGNOSIS — H401134 Primary open-angle glaucoma, bilateral, indeterminate stage: Secondary | ICD-10-CM | POA: Diagnosis not present

## 2023-09-24 DIAGNOSIS — D649 Anemia, unspecified: Secondary | ICD-10-CM | POA: Diagnosis not present

## 2023-09-29 DIAGNOSIS — H353132 Nonexudative age-related macular degeneration, bilateral, intermediate dry stage: Secondary | ICD-10-CM | POA: Diagnosis not present

## 2023-10-02 ENCOUNTER — Ambulatory Visit: Payer: Medicare Other | Admitting: Neurology

## 2023-10-07 DIAGNOSIS — Z6821 Body mass index (BMI) 21.0-21.9, adult: Secondary | ICD-10-CM | POA: Diagnosis not present

## 2023-10-07 DIAGNOSIS — M792 Neuralgia and neuritis, unspecified: Secondary | ICD-10-CM | POA: Diagnosis not present

## 2023-10-31 ENCOUNTER — Other Ambulatory Visit: Payer: Self-pay | Admitting: Neurology

## 2023-12-04 DIAGNOSIS — Z6821 Body mass index (BMI) 21.0-21.9, adult: Secondary | ICD-10-CM | POA: Diagnosis not present

## 2023-12-04 DIAGNOSIS — S161XXA Strain of muscle, fascia and tendon at neck level, initial encounter: Secondary | ICD-10-CM | POA: Diagnosis not present

## 2023-12-04 DIAGNOSIS — J069 Acute upper respiratory infection, unspecified: Secondary | ICD-10-CM | POA: Diagnosis not present

## 2023-12-30 ENCOUNTER — Other Ambulatory Visit: Payer: Self-pay | Admitting: Neurology

## 2024-01-14 DIAGNOSIS — Z1231 Encounter for screening mammogram for malignant neoplasm of breast: Secondary | ICD-10-CM | POA: Diagnosis not present

## 2024-01-27 ENCOUNTER — Other Ambulatory Visit: Payer: Self-pay | Admitting: Neurology

## 2024-01-27 ENCOUNTER — Telehealth: Payer: Self-pay

## 2024-01-27 NOTE — Telephone Encounter (Signed)
 Call to patient after receiving refill request for Plavix . Offered appt, patient didn't have her calendar. Will call back to schedule.

## 2024-02-18 DIAGNOSIS — H401134 Primary open-angle glaucoma, bilateral, indeterminate stage: Secondary | ICD-10-CM | POA: Diagnosis not present

## 2024-02-20 ENCOUNTER — Encounter: Payer: Self-pay | Admitting: Gastroenterology

## 2024-02-26 ENCOUNTER — Other Ambulatory Visit: Payer: Self-pay | Admitting: Neurology

## 2024-03-05 DIAGNOSIS — E875 Hyperkalemia: Secondary | ICD-10-CM | POA: Diagnosis not present

## 2024-03-05 DIAGNOSIS — D649 Anemia, unspecified: Secondary | ICD-10-CM | POA: Diagnosis not present

## 2024-03-05 DIAGNOSIS — E039 Hypothyroidism, unspecified: Secondary | ICD-10-CM | POA: Diagnosis not present

## 2024-03-05 DIAGNOSIS — Z0001 Encounter for general adult medical examination with abnormal findings: Secondary | ICD-10-CM | POA: Diagnosis not present

## 2024-03-10 DIAGNOSIS — D649 Anemia, unspecified: Secondary | ICD-10-CM | POA: Diagnosis not present

## 2024-03-10 DIAGNOSIS — H409 Unspecified glaucoma: Secondary | ICD-10-CM | POA: Diagnosis not present

## 2024-03-10 DIAGNOSIS — Z6821 Body mass index (BMI) 21.0-21.9, adult: Secondary | ICD-10-CM | POA: Diagnosis not present

## 2024-03-10 DIAGNOSIS — E039 Hypothyroidism, unspecified: Secondary | ICD-10-CM | POA: Diagnosis not present

## 2024-03-10 DIAGNOSIS — K2100 Gastro-esophageal reflux disease with esophagitis, without bleeding: Secondary | ICD-10-CM | POA: Diagnosis not present

## 2024-03-16 DIAGNOSIS — I726 Aneurysm of vertebral artery: Secondary | ICD-10-CM | POA: Diagnosis not present

## 2024-03-16 DIAGNOSIS — Z0389 Encounter for observation for other suspected diseases and conditions ruled out: Secondary | ICD-10-CM | POA: Diagnosis not present

## 2024-04-07 ENCOUNTER — Inpatient Hospital Stay
Admission: RE | Admit: 2024-04-07 | Discharge: 2024-04-07 | Disposition: A | Discharge status: Home / Self Care | Payer: Self-pay | Source: Ambulatory Visit | Attending: Vascular Surgery | Admitting: Vascular Surgery

## 2024-04-07 ENCOUNTER — Other Ambulatory Visit (HOSPITAL_COMMUNITY): Payer: Self-pay | Admitting: Vascular Surgery

## 2024-04-07 DIAGNOSIS — I726 Aneurysm of vertebral artery: Secondary | ICD-10-CM

## 2024-04-07 DIAGNOSIS — D649 Anemia, unspecified: Secondary | ICD-10-CM | POA: Diagnosis not present

## 2024-04-07 DIAGNOSIS — H409 Unspecified glaucoma: Secondary | ICD-10-CM | POA: Diagnosis not present

## 2024-04-07 DIAGNOSIS — E039 Hypothyroidism, unspecified: Secondary | ICD-10-CM | POA: Diagnosis not present

## 2024-04-07 DIAGNOSIS — Z6821 Body mass index (BMI) 21.0-21.9, adult: Secondary | ICD-10-CM | POA: Diagnosis not present

## 2024-04-07 DIAGNOSIS — K2100 Gastro-esophageal reflux disease with esophagitis, without bleeding: Secondary | ICD-10-CM | POA: Diagnosis not present

## 2024-04-08 DIAGNOSIS — H353131 Nonexudative age-related macular degeneration, bilateral, early dry stage: Secondary | ICD-10-CM | POA: Diagnosis not present

## 2024-04-08 DIAGNOSIS — H401134 Primary open-angle glaucoma, bilateral, indeterminate stage: Secondary | ICD-10-CM | POA: Diagnosis not present

## 2024-04-08 DIAGNOSIS — Z961 Presence of intraocular lens: Secondary | ICD-10-CM | POA: Diagnosis not present

## 2024-04-08 DIAGNOSIS — H3321 Serous retinal detachment, right eye: Secondary | ICD-10-CM | POA: Diagnosis not present

## 2024-04-14 DIAGNOSIS — I10 Essential (primary) hypertension: Secondary | ICD-10-CM | POA: Diagnosis not present

## 2024-04-19 ENCOUNTER — Other Ambulatory Visit (HOSPITAL_COMMUNITY): Payer: Self-pay | Admitting: Vascular Surgery

## 2024-04-19 ENCOUNTER — Other Ambulatory Visit: Payer: Self-pay | Admitting: Orthopedic Surgery

## 2024-04-19 ENCOUNTER — Inpatient Hospital Stay
Admission: RE | Admit: 2024-04-19 | Discharge: 2024-04-19 | Disposition: A | Discharge status: Home / Self Care | Payer: Self-pay | Source: Ambulatory Visit | Attending: Vascular Surgery | Admitting: Vascular Surgery

## 2024-04-19 DIAGNOSIS — M19012 Primary osteoarthritis, left shoulder: Secondary | ICD-10-CM | POA: Diagnosis not present

## 2024-04-19 DIAGNOSIS — Z96611 Presence of right artificial shoulder joint: Secondary | ICD-10-CM | POA: Diagnosis not present

## 2024-04-19 DIAGNOSIS — I726 Aneurysm of vertebral artery: Secondary | ICD-10-CM

## 2024-04-21 ENCOUNTER — Ambulatory Visit
Admission: RE | Admit: 2024-04-21 | Discharge: 2024-04-21 | Disposition: A | Discharge status: Home / Self Care | Source: Ambulatory Visit | Attending: Orthopedic Surgery | Admitting: Orthopedic Surgery

## 2024-04-21 DIAGNOSIS — M19012 Primary osteoarthritis, left shoulder: Secondary | ICD-10-CM

## 2024-04-21 DIAGNOSIS — M25412 Effusion, left shoulder: Secondary | ICD-10-CM | POA: Diagnosis not present

## 2024-04-23 DIAGNOSIS — I10 Essential (primary) hypertension: Secondary | ICD-10-CM | POA: Diagnosis not present

## 2024-04-24 DIAGNOSIS — K2100 Gastro-esophageal reflux disease with esophagitis, without bleeding: Secondary | ICD-10-CM | POA: Diagnosis not present

## 2024-04-24 DIAGNOSIS — E039 Hypothyroidism, unspecified: Secondary | ICD-10-CM | POA: Diagnosis not present

## 2024-04-24 DIAGNOSIS — H409 Unspecified glaucoma: Secondary | ICD-10-CM | POA: Diagnosis not present

## 2024-04-24 DIAGNOSIS — M810 Age-related osteoporosis without current pathological fracture: Secondary | ICD-10-CM | POA: Diagnosis not present

## 2024-04-24 DIAGNOSIS — Z6821 Body mass index (BMI) 21.0-21.9, adult: Secondary | ICD-10-CM | POA: Diagnosis not present

## 2024-05-06 DIAGNOSIS — H6121 Impacted cerumen, right ear: Secondary | ICD-10-CM | POA: Diagnosis not present

## 2024-05-21 DIAGNOSIS — I10 Essential (primary) hypertension: Secondary | ICD-10-CM | POA: Diagnosis not present

## 2024-05-21 DIAGNOSIS — E039 Hypothyroidism, unspecified: Secondary | ICD-10-CM | POA: Diagnosis not present

## 2024-05-21 DIAGNOSIS — Z6821 Body mass index (BMI) 21.0-21.9, adult: Secondary | ICD-10-CM | POA: Diagnosis not present

## 2024-05-21 DIAGNOSIS — K2100 Gastro-esophageal reflux disease with esophagitis, without bleeding: Secondary | ICD-10-CM | POA: Diagnosis not present

## 2024-05-21 DIAGNOSIS — H409 Unspecified glaucoma: Secondary | ICD-10-CM | POA: Diagnosis not present

## 2024-05-21 DIAGNOSIS — D649 Anemia, unspecified: Secondary | ICD-10-CM | POA: Diagnosis not present

## 2024-05-21 DIAGNOSIS — K580 Irritable bowel syndrome with diarrhea: Secondary | ICD-10-CM | POA: Diagnosis not present

## 2024-05-21 DIAGNOSIS — I726 Aneurysm of vertebral artery: Secondary | ICD-10-CM | POA: Diagnosis not present

## 2024-05-21 DIAGNOSIS — M810 Age-related osteoporosis without current pathological fracture: Secondary | ICD-10-CM | POA: Diagnosis not present

## 2024-05-21 DIAGNOSIS — I720 Aneurysm of carotid artery: Secondary | ICD-10-CM | POA: Diagnosis not present

## 2024-05-24 NOTE — Progress Notes (Incomplete)
 COVID Vaccine received:  []  No [x]  Yes Date of any COVID positive Test in last 90 days:  PCP - Dr. Deward Soja [phone] (Work)  [phone] (Fax)  Cardiologist -   Chest x-ray - 09-03-2021  1v  EKG -  2023   repeat  Stress Test -  ECHO - 09-04-2021  Epic Cardiac Cath -  CT Coronary Calcium  score:   Pacemaker / ICD device [x]  No []  Yes   Spinal Cord Stimulator:[x]  No []  Yes       History of Sleep Apnea? [x]  No []  Yes   CPAP used?- [x]  No []  Yes    Patient has: [x]  NO Hx DM   []  Pre-DM   []  DM1  []   DM2 Does the patient monitor blood sugar?   [x]  N/A   []  No []  Yes  Last A1c was: 5.4 on  [id number]     Blood Thinner / Instructions: PLAVIX    Aspirin  Instructions:  Dental hx: []  Dentures:  []  N/A      []  Bridge or Partial:                   []  Loose or Damaged teeth:   Comments:   Activity level: Able to walk up 2 flights of stairs without becoming significantly short of breath or having chest pain?  []  No   []    Yes  Patient can perform ADLs without assistance. 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   Patient denies any S&S of respiratory illness or Covid - no shortness of breath, fever, cough or chest pain at PAT appointment.  Patient verbalized understanding and agreement to the Pre-Surgical Instructions that were given to them at this PAT appointment. Patient was also educated of the need to review these PAT instructions again prior to her surgery.I reviewed the appropriate phone numbers to call if they have any and questions or concerns.

## 2024-05-24 NOTE — Patient Instructions (Addendum)
 SURGICAL WAITING ROOM VISITATION Patients having surgery or a procedure may have no more than 2 support people in the waiting area - these visitors may rotate in the visitor waiting room.   If the patient needs to stay at the hospital during part of their recovery, the visitor guidelines for inpatient rooms apply.  PRE-OP VISITATION  Pre-op nurse will coordinate an appropriate time for 1 support person to accompany the patient in pre-op.  This support person may not rotate.  This visitor will be contacted when the time is appropriate for the visitor to come back in the pre-op area.  Please refer to the [facility] website for the visitor guidelines for Inpatients (after your surgery is over and you are in a regular room).  You are not required to quarantine at this time prior to your surgery. However, you must do this: Hand Hygiene often Do NOT share personal items Notify your provider if you are in close contact with someone who has COVID or you develop fever 100.4 or greater, new onset of sneezing, cough, sore throat, shortness of breath or body aches.  If you test positive for Covid or have been in contact with anyone that has tested positive in the last 10 days please notify you surgeon.    Your procedure is scheduled on:  Thursday  June 03, 2024  Report to [facility] Main Entrance: [address] entrance where the [employer] is available.   Report to admitting at: 07:00   AM  Call this number if you have any questions or problems the morning of surgery [phone]  Do not eat food after Midnight the night prior to your surgery/procedure.  After Midnight you may have the following liquids until  06:30 AM DAY OF SURGERY  Clear Liquid Diet Water  Black Coffee (sugar ok, NO MILK/CREAM OR CREAMERS)  Tea (sugar ok, NO MILK/CREAM OR CREAMERS) regular and decaf                             Plain Jell-O  with no fruit (NO RED)                                           Fruit  ices (not with fruit pulp, NO RED)                                     Popsicles (NO RED)                                                                  Juice: NO CITRUS JUICES: only apple, WHITE grape, WHITE cranberry Sports drinks like Gatorade or Powerade (NO RED)                   The day of surgery:  Drink ONE (1) Pre-Surgery Clear Ensure at  06:30 AM the morning of surgery. Drink in one sitting. Do not sip.  This drink was given to you during your hospital pre-op appointment visit. Nothing else to drink after completing the  Pre-Surgery Clear Ensure : No candy, chewing gum or throat lozenges.    FOLLOW ANY ADDITIONAL PRE OP INSTRUCTIONS YOU RECEIVED FROM YOUR SURGEON'S OFFICE!!!   Oral Hygiene is also important to reduce your risk of infection.        Remember - BRUSH YOUR TEETH THE MORNING OF SURGERY WITH YOUR REGULAR TOOTHPASTE  Do NOT smoke after Midnight the night before surgery.  STOP TAKING all Vitamins, Herbs and supplements 1 week before your surgery.   PLAVIX - stop taking ????  Take ONLY these medicines the morning of surgery with A SIP OF WATER : Pantoprazole , Levothyroxine  and Tylenol  if needed. You may use your Eye drops.   DO NOT TAKE OLMESARTAN-HCTZ the morning of your surgery.   You may not have any metal on your body including hair pins, jewelry, and body piercing  Do not wear make-up, lotions, powders, perfumes or deodorant  Do not wear nail polish including gel and S&S, artificial / acrylic nails, or any other type of covering on natural nails including finger and toenails. If you have artificial nails, gel coating, etc., that needs to be removed by a nail salon, Please have this removed prior to surgery. Not doing so may mean that your surgery could be cancelled or delayed if the Surgeon or anesthesia staff feels like they are unable to monitor you safely.   Do not shave 48 hours prior to surgery to avoid nicks in your skin which may contribute to  postoperative infections.   Contacts, Hearing Aids, dentures or bridgework may not be worn into surgery. DENTURES WILL BE REMOVED PRIOR TO SURGERY PLEASE DO NOT APPLY Poly grip OR ADHESIVES!!!  You may bring a small overnight bag with you on the day of surgery, only pack items that are not valuable. [facility] IS NOT RESPONSIBLE   FOR VALUABLES THAT ARE LOST OR STOLEN.   Patients discharged on the day of surgery will not be allowed to drive home.  Someone NEEDS to stay with you for the first 24 hours after anesthesia.  Do not bring your home medications to the hospital. The Pharmacy will dispense medications listed on your medication list to you during your admission in the Hospital.  Special Instructions: Bring a copy of your healthcare power of attorney and living will documents the day of surgery, if you wish to have them scanned into your [facility] Records- EPIC  Please read over the following fact sheets you were given: IF YOU HAVE QUESTIONS ABOUT YOUR PRE-OP INSTRUCTIONS, PLEASE CALL [phone].      Pre-operative 4 CHG Bath Instructions   You can play a key role in reducing the risk of infection after surgery. Your skin needs to be as free of germs as possible. You can reduce the number of germs on your skin by washing with CHG (chlorhexidine  gluconate) soap before surgery. CHG is an antiseptic soap that kills germs and continues to kill germs even after washing.   DO NOT use if you have an allergy to chlorhexidine /CHG or antibacterial soaps. If your skin becomes reddened or irritated, stop using the CHG and notify one of our RNs at   Please shower with the CHG soap starting 4 days before surgery using the following schedule:     Please keep in mind the following:  DO NOT shave, including legs and underarms, starting the day of your first shower.   You may shave your face at any point before/day of surgery.  Place clean sheets on your bed  the day you start using  CHG soap. Use a clean washcloth (not used since being washed) for each shower. DO NOT sleep with pets once you start using the CHG.  CHG Shower Instructions:  If you choose to wash your hair and private area, wash first with your normal shampoo/soap.  After you use shampoo/soap, rinse your hair and body thoroughly to remove shampoo/soap residue.  Turn the water  OFF and apply about 3 tablespoons (45 ml) of CHG soap to a CLEAN washcloth.  Apply CHG soap ONLY FROM YOUR NECK DOWN TO YOUR TOES (washing for 3-5 minutes)  DO NOT use CHG soap on face, private areas, open wounds, or sores.  Pay special attention to the area where your surgery is being performed.  If you are having back surgery, having someone wash your back for you may be helpful. Wait 2 minutes after CHG soap is applied, then you may rinse off the CHG soap.  Pat dry with a clean towel  Put on clean clothes/pajamas   If you choose to wear lotion, please use ONLY the CHG-compatible lotions on the back of this paper.     Additional instructions for the day of surgery: DO NOT APPLY any CHG Soap,  lotions, deodorants, cologne, or perfumes on the day of surgery  Put on clean/comfortable clothes.  Brush your teeth.  Ask your nurse before applying any prescription medications to the skin.   CHG Compatible Lotions   Aveeno Moisturizing lotion  Cetaphil Moisturizing Cream  Cetaphil Moisturizing Lotion  Clairol Herbal Essence Moisturizing Lotion, Dry Skin  Clairol Herbal Essence Moisturizing Lotion, Extra Dry Skin  Clairol Herbal Essence Moisturizing Lotion, Normal Skin  Curel Age Defying Therapeutic Moisturizing Lotion with Alpha Hydroxy  Curel Extreme Care Body Lotion  Curel Soothing Hands Moisturizing Hand Lotion  Curel Therapeutic Moisturizing Cream, Fragrance-Free  Curel Therapeutic Moisturizing Lotion, Fragrance-Free  Curel Therapeutic Moisturizing Lotion, Original Formula  Eucerin Daily Replenishing Lotion  Eucerin Dry Skin  Therapy Plus Alpha Hydroxy Crme  Eucerin Dry Skin Therapy Plus Alpha Hydroxy Lotion  Eucerin Original Crme  Eucerin Original Lotion  Eucerin Plus Crme Eucerin Plus Lotion  Eucerin TriLipid Replenishing Lotion  Keri Anti-Bacterial Hand Lotion  Keri Deep Conditioning Original Lotion Dry Skin Formula Softly Scented  Keri Deep Conditioning Original Lotion, Fragrance Free Sensitive Skin Formula  Keri Lotion Fast Absorbing Fragrance Free Sensitive Skin Formula  Keri Lotion Fast Absorbing Softly Scented Dry Skin Formula  Keri Original Lotion  Keri Skin Renewal Lotion Keri Silky Smooth Lotion  Keri Silky Smooth Sensitive Skin Lotion  Nivea Body Creamy Conditioning Oil  Nivea Body Extra Enriched Lotion  Nivea Body Original Lotion  Nivea Body Sheer Moisturizing Lotion Nivea Crme  Nivea Skin Firming Lotion  NutraDerm 30 Skin Lotion  NutraDerm Skin Lotion  NutraDerm Therapeutic Skin Cream  NutraDerm Therapeutic Skin Lotion  ProShield Protective Hand Cream  Provon moisturizing lotion     Preparing for Total Shoulder Arthroplasty ================================================================= Please follow these instructions carefully, in addition to any other special Bathing information that was explained to you at the Presurgical Appointment:  BENZOYL PEROXIDE 5% GEL: Used to kill bacteria on the skin which could cause an infection at the surgery site.   Please do not use if you have an allergy to benzoyl peroxide. If your skin becomes reddened/irritated stop using the benzoyl peroxide and inform your Doctor.   Starting two days before surgery, apply as follows:  1. Apply benzoyl peroxide gel in the morning and at night.  Apply after taking a shower. If you are not taking a shower, clean entire shoulder front, back, and side, along with the armpit with a clean wet washcloth.  2. Place a quarter-sized dollop of the gel on your SHOULDER and rub in thoroughly, making sure to cover the  front, back, and side of your shoulder, along with the armpit.   2 Days prior to Surgery   TUESDAY  06-01-2024 First Application _______ Morning Second Application _______ Night  Day Before Surgery        [facility]  06-02-2024 First Application______ Morning  On the night before surgery, wash your entire body (except hair, face and private areas) with CHG Soap. THEN, rub in the LAST application of the Benzoyl Peroxide Gel on your shoulder.   3.   DO NOT USE THE BENZOYL PEROXIDE GEL OR CHG SOAP ON THE DAY OF YOUR SURGERY       FAILURE TO FOLLOW THESE INSTRUCTIONS MAY RESULT IN THE CANCELLATION OF YOUR SURGERY  PATIENT SIGNATURE_________________________________  NURSE SIGNATURE__________________________________  ________________________________________________________________________

## 2024-05-25 ENCOUNTER — Other Ambulatory Visit: Payer: Self-pay

## 2024-05-25 ENCOUNTER — Encounter (HOSPITAL_COMMUNITY)
Admission: RE | Admit: 2024-05-25 | Discharge: 2024-05-25 | Disposition: A | Discharge status: Home / Self Care | Source: Ambulatory Visit | Attending: Orthopedic Surgery | Admitting: Orthopedic Surgery

## 2024-05-25 ENCOUNTER — Encounter (HOSPITAL_COMMUNITY): Payer: Self-pay

## 2024-05-25 VITALS — BP 176/74 | HR 70 | Temp 98.3°F | Resp 14 | Ht 60.0 in | Wt 120.0 lb

## 2024-05-25 DIAGNOSIS — M19012 Primary osteoarthritis, left shoulder: Secondary | ICD-10-CM | POA: Diagnosis not present

## 2024-05-25 DIAGNOSIS — Z01810 Encounter for preprocedural cardiovascular examination: Secondary | ICD-10-CM | POA: Diagnosis present

## 2024-05-25 DIAGNOSIS — Z01818 Encounter for other preprocedural examination: Secondary | ICD-10-CM | POA: Diagnosis not present

## 2024-05-25 DIAGNOSIS — I10 Essential (primary) hypertension: Secondary | ICD-10-CM | POA: Insufficient documentation

## 2024-05-25 DIAGNOSIS — R9431 Abnormal electrocardiogram [ECG] [EKG]: Secondary | ICD-10-CM | POA: Insufficient documentation

## 2024-05-25 DIAGNOSIS — Z01812 Encounter for preprocedural laboratory examination: Secondary | ICD-10-CM | POA: Diagnosis present

## 2024-05-25 HISTORY — DX: Claustrophobia: F40.240

## 2024-05-25 HISTORY — DX: Cerebral infarction, unspecified: I63.9

## 2024-05-25 HISTORY — DX: Mild cognitive impairment of uncertain or unknown etiology: G31.84

## 2024-05-25 LAB — CBC
HCT: 31.7 % — ABNORMAL LOW (ref 36.0–46.0)
Hemoglobin: 10.1 g/dL — ABNORMAL LOW (ref 12.0–15.0)
MCH: 31.1 pg (ref 26.0–34.0)
MCHC: 31.9 g/dL (ref 30.0–36.0)
MCV: 97.5 fL (ref 80.0–100.0)
Platelets: 264 K/uL (ref 150–400)
RBC: 3.25 MIL/uL — ABNORMAL LOW (ref 3.87–5.11)
RDW: 13.1 % (ref 11.5–15.5)
WBC: 6.9 K/uL (ref 4.0–10.5)
nRBC: 0 % (ref 0.0–0.2)

## 2024-05-25 LAB — BASIC METABOLIC PANEL WITH GFR
Anion gap: 11 (ref 5–15)
BUN: 20 mg/dL (ref 8–23)
CO2: 23 mmol/L (ref 22–32)
Calcium: 9.7 mg/dL (ref 8.9–10.3)
Chloride: 95 mmol/L — ABNORMAL LOW (ref 98–111)
Creatinine, Ser: 0.88 mg/dL (ref 0.44–1.00)
GFR, Estimated: >60 mL/min (ref >60)
Glucose, Bld: 93 mg/dL (ref 70–99)
Potassium: 4.6 mmol/L (ref 3.5–5.1)
Sodium: 129 mmol/L — ABNORMAL LOW (ref 135–145)

## 2024-05-25 LAB — SURGICAL PCR SCREEN
MRSA, PCR: NEGATIVE
Staphylococcus aureus: NEGATIVE

## 2024-06-02 NOTE — Anesthesia Preprocedure Evaluation (Signed)
 Anesthesia Evaluation  Patient identified by MRN, date of birth, ID band Patient awake    Reviewed: Allergy & Precautions, H&P , NPO status , Patient's Chart, lab work & pertinent test results  History of Anesthesia Complications (+) history of anesthetic complications  Airway Mallampati: I  TM Distance: >3 FB Neck ROM: Full    Dental no notable dental hx. (+) Teeth Intact, Dental Advisory Given   Pulmonary neg pulmonary ROS   Pulmonary exam normal breath sounds clear to auscultation       Cardiovascular hypertension, pulmonary hypertensionnegative cardio ROS Normal cardiovascular exam Rhythm:Regular Rate:Normal     Neuro/Psych  Headaches PSYCHIATRIC DISORDERS Anxiety     TIA Neuromuscular disease CVA negative neurological ROS  negative psych ROS   GI/Hepatic negative GI ROS, Neg liver ROS,GERD  ,,  Endo/Other  negative endocrine ROSHypothyroidism    Renal/GU negative Renal ROS  negative genitourinary   Musculoskeletal negative musculoskeletal ROS (+) Arthritis ,    Abdominal Normal abdominal exam  (+)   Peds negative pediatric ROS (+)  Hematology negative hematology ROS (+) Blood dyscrasia, anemia   Anesthesia Other Findings   Reproductive/Obstetrics negative OB ROS                              Anesthesia Physical Anesthesia Plan  ASA: 3  Anesthesia Plan: General and Regional   Post-op Pain Management: Ofirmev  IV (intra-op)* and Regional block*   Induction: Intravenous  PONV Risk Score and Plan: 4 or greater and Ondansetron , Dexamethasone , Treatment may vary due to age or medical condition and Midazolam   Airway Management Planned: Oral ETT  Additional Equipment: None  Intra-op Plan:   Post-operative Plan: Extubation in OR  Informed Consent: I have reviewed the patients History and Physical, chart, labs and discussed the procedure including the risks, benefits and  alternatives for the proposed anesthesia with the patient or authorized representative who has indicated his/her understanding and acceptance.     Dental advisory given  Plan Discussed with: CRNA and Anesthesiologist  Anesthesia Plan Comments:          Anesthesia Quick Evaluation

## 2024-06-03 ENCOUNTER — Other Ambulatory Visit: Payer: Self-pay

## 2024-06-03 ENCOUNTER — Encounter (HOSPITAL_COMMUNITY): Payer: Self-pay | Admitting: Orthopedic Surgery

## 2024-06-03 ENCOUNTER — Ambulatory Visit (HOSPITAL_BASED_OUTPATIENT_CLINIC_OR_DEPARTMENT_OTHER): Admitting: Anesthesiology

## 2024-06-03 ENCOUNTER — Ambulatory Visit (HOSPITAL_COMMUNITY): Payer: Self-pay | Admitting: Physician Assistant

## 2024-06-03 ENCOUNTER — Encounter (HOSPITAL_COMMUNITY)
Admission: RE | Disposition: A | Discharge status: Home / Self Care | Payer: Self-pay | Source: Home / Self Care | Attending: Orthopedic Surgery

## 2024-06-03 ENCOUNTER — Ambulatory Visit (HOSPITAL_COMMUNITY)
Admission: RE | Admit: 2024-06-03 | Discharge: 2024-06-03 | Disposition: A | Discharge status: Home / Self Care | Attending: Orthopedic Surgery | Admitting: Orthopedic Surgery

## 2024-06-03 DIAGNOSIS — R519 Headache, unspecified: Secondary | ICD-10-CM | POA: Insufficient documentation

## 2024-06-03 DIAGNOSIS — M19012 Primary osteoarthritis, left shoulder: Secondary | ICD-10-CM | POA: Insufficient documentation

## 2024-06-03 DIAGNOSIS — I10 Essential (primary) hypertension: Secondary | ICD-10-CM

## 2024-06-03 DIAGNOSIS — D649 Anemia, unspecified: Secondary | ICD-10-CM | POA: Insufficient documentation

## 2024-06-03 DIAGNOSIS — E7800 Pure hypercholesterolemia, unspecified: Secondary | ICD-10-CM | POA: Diagnosis not present

## 2024-06-03 DIAGNOSIS — F419 Anxiety disorder, unspecified: Secondary | ICD-10-CM | POA: Diagnosis not present

## 2024-06-03 DIAGNOSIS — N3021 Other chronic cystitis with hematuria: Secondary | ICD-10-CM

## 2024-06-03 DIAGNOSIS — K219 Gastro-esophageal reflux disease without esophagitis: Secondary | ICD-10-CM | POA: Diagnosis not present

## 2024-06-03 HISTORY — PX: REVERSE SHOULDER ARTHROPLASTY: SHX5054

## 2024-06-03 SURGERY — ARTHROPLASTY, SHOULDER, TOTAL, REVERSE
Anesthesia: Regional | Site: Shoulder | Laterality: Left

## 2024-06-03 MED ORDER — FENTANYL CITRATE (PF) 100 MCG/2ML IJ SOLN
INTRAMUSCULAR | Status: AC
  Filled 2024-06-03: qty 2

## 2024-06-03 MED ORDER — CHLORHEXIDINE GLUCONATE 0.12 % MT SOLN
15.0000 mL | Freq: Once | OROMUCOSAL | Status: AC
  Administered 2024-06-03: 15 mL via OROMUCOSAL

## 2024-06-03 MED ORDER — MELOXICAM 15 MG PO TABS
15.0000 mg | ORAL_TABLET | Freq: Every morning | ORAL | 1 refills | Status: AC
Start: 2024-06-03 — End: ?

## 2024-06-03 MED ORDER — LIDOCAINE HCL (PF) 2 % IJ SOLN
INTRAMUSCULAR | Status: AC
  Filled 2024-06-03: qty 5

## 2024-06-03 MED ORDER — ROCURONIUM BROMIDE 10 MG/ML (PF) SYRINGE
PREFILLED_SYRINGE | INTRAVENOUS | Status: AC
  Filled 2024-06-03: qty 10

## 2024-06-03 MED ORDER — TRANEXAMIC ACID 1000 MG/10ML IV SOLN: 1000.0000 mg | INTRAVENOUS | Status: DC

## 2024-06-03 MED ORDER — OXYCODONE HCL 5 MG/5ML PO SOLN: 5.0000 mg | Freq: Once | ORAL | Status: DC | PRN

## 2024-06-03 MED ORDER — PROPOFOL 10 MG/ML IV BOLUS
INTRAVENOUS | Status: AC
  Filled 2024-06-03: qty 20

## 2024-06-03 MED ORDER — ONDANSETRON HCL 4 MG PO TABS: 4.0000 mg | ORAL_TABLET | Freq: Three times a day (TID) | ORAL | 0 refills | Status: AC | PRN

## 2024-06-03 MED ORDER — VANCOMYCIN HCL 1000 MG IV SOLR
INTRAVENOUS | Status: AC
  Filled 2024-06-03: qty 20

## 2024-06-03 MED ORDER — CEFAZOLIN SODIUM-DEXTROSE 2-4 GM/100ML-% IV SOLN
2.0000 g | INTRAVENOUS | Status: AC
  Administered 2024-06-03: 2 g via INTRAVENOUS
  Filled 2024-06-03: qty 100

## 2024-06-03 MED ORDER — SUGAMMADEX SODIUM 200 MG/2ML IV SOLN
INTRAVENOUS | Status: DC | PRN
  Administered 2024-06-03: 200 mg via INTRAVENOUS

## 2024-06-03 MED ORDER — LIDOCAINE HCL (CARDIAC) PF 100 MG/5ML IV SOSY
PREFILLED_SYRINGE | INTRAVENOUS | Status: DC | PRN
  Administered 2024-06-03: 60 mg via INTRAVENOUS

## 2024-06-03 MED ORDER — FENTANYL CITRATE (PF) 50 MCG/ML IJ SOSY: 25.0000 ug | PREFILLED_SYRINGE | INTRAMUSCULAR | Status: DC | PRN

## 2024-06-03 MED ORDER — MIDAZOLAM HCL (PF) 2 MG/2ML IJ SOLN
1.0000 mg | Freq: Once | INTRAMUSCULAR | Status: AC
  Administered 2024-06-03: 1 mg via INTRAVENOUS
  Filled 2024-06-03: qty 2

## 2024-06-03 MED ORDER — STERILE WATER FOR IRRIGATION IR SOLN
Status: DC | PRN
  Administered 2024-06-03: 2000 mL

## 2024-06-03 MED ORDER — FENTANYL CITRATE (PF) 100 MCG/2ML IJ SOLN
INTRAMUSCULAR | Status: DC | PRN
  Administered 2024-06-03 (×2): 25 ug via INTRAVENOUS

## 2024-06-03 MED ORDER — ORAL CARE MOUTH RINSE
15.0000 mL | Freq: Once | OROMUCOSAL | Status: AC
Start: 2024-06-03 — End: 2024-06-03

## 2024-06-03 MED ORDER — LACTATED RINGERS IV SOLN: INTRAVENOUS | Status: DC

## 2024-06-03 MED ORDER — SUGAMMADEX SODIUM 200 MG/2ML IV SOLN
INTRAVENOUS | Status: AC
  Filled 2024-06-03: qty 2

## 2024-06-03 MED ORDER — CYCLOBENZAPRINE HCL 10 MG PO TABS: 10.0000 mg | ORAL_TABLET | Freq: Three times a day (TID) | ORAL | 1 refills | Status: AC | PRN

## 2024-06-03 MED ORDER — OXYCODONE-ACETAMINOPHEN 5-325 MG PO TABS: 1.0000 | ORAL_TABLET | ORAL | 0 refills | Status: AC | PRN

## 2024-06-03 MED ORDER — ONDANSETRON HCL 4 MG/2ML IJ SOLN: 4.0000 mg | Freq: Once | INTRAMUSCULAR | Status: DC | PRN

## 2024-06-03 MED ORDER — OXYCODONE HCL 5 MG PO TABS: 5.0000 mg | ORAL_TABLET | Freq: Once | ORAL | Status: DC | PRN

## 2024-06-03 MED ORDER — TRANEXAMIC ACID-NACL 1000-0.7 MG/100ML-% IV SOLN
1000.0000 mg | INTRAVENOUS | Status: AC
  Administered 2024-06-03: 1000 mg via INTRAVENOUS
  Filled 2024-06-03: qty 100

## 2024-06-03 MED ORDER — MEPERIDINE HCL 25 MG/ML IJ SOLN: 6.2500 mg | INTRAMUSCULAR | Status: DC | PRN

## 2024-06-03 MED ORDER — LABETALOL HCL 5 MG/ML IV SOLN
10.0000 mg | Freq: Once | INTRAVENOUS | Status: AC
  Administered 2024-06-03: 10 mg via INTRAVENOUS

## 2024-06-03 MED ORDER — ROCURONIUM BROMIDE 100 MG/10ML IV SOLN
INTRAVENOUS | Status: DC | PRN
  Administered 2024-06-03: 50 mg via INTRAVENOUS

## 2024-06-03 MED ORDER — FENTANYL CITRATE (PF) 50 MCG/ML IJ SOSY
50.0000 ug | PREFILLED_SYRINGE | Freq: Once | INTRAMUSCULAR | Status: AC
  Administered 2024-06-03: 50 ug via INTRAVENOUS
  Filled 2024-06-03: qty 2

## 2024-06-03 MED ORDER — LABETALOL HCL 5 MG/ML IV SOLN
INTRAVENOUS | Status: AC
  Filled 2024-06-03: qty 4

## 2024-06-03 MED ORDER — ONDANSETRON HCL 4 MG/2ML IJ SOLN
INTRAMUSCULAR | Status: AC
  Filled 2024-06-03: qty 2

## 2024-06-03 MED ORDER — VANCOMYCIN HCL 1000 MG IV SOLR
INTRAVENOUS | Status: DC | PRN
  Administered 2024-06-03: 1000 mg

## 2024-06-03 MED ORDER — 0.9 % SODIUM CHLORIDE (POUR BTL) OPTIME
TOPICAL | Status: DC | PRN
  Administered 2024-06-03: 1000 mL

## 2024-06-03 MED ORDER — DEXAMETHASONE SOD PHOSPHATE PF 10 MG/ML IJ SOLN
INTRAMUSCULAR | Status: DC | PRN
  Administered 2024-06-03: 5 mg via INTRAVENOUS

## 2024-06-03 MED ORDER — PROPOFOL 10 MG/ML IV BOLUS
INTRAVENOUS | Status: DC | PRN
  Administered 2024-06-03: 130 mg via INTRAVENOUS

## 2024-06-03 MED ORDER — ONDANSETRON HCL 4 MG/2ML IJ SOLN
INTRAMUSCULAR | Status: DC | PRN
  Administered 2024-06-03: 4 mg via INTRAVENOUS

## 2024-06-03 SURGICAL SUPPLY — 57 items
BAG COUNTER SPONGE SURGICOUNT (BAG) IMPLANT
BAG ZIPLOCK 12X15 (MISCELLANEOUS) ×1 IMPLANT
BASEPLATE AUG 24 20D (Plate) IMPLANT
BLADE SAW SGTL 83.5X18.5 (BLADE) ×1 IMPLANT
BNDG COHESIVE 4X5 TAN STRL LF (GAUZE/BANDAGES/DRESSINGS) ×1 IMPLANT
CALIBRATOR GLENOID VIP 5-D (SYSTAGENIX WOUND MANAGEMENT) IMPLANT
COOLER ICEMAN CLASSIC (MISCELLANEOUS) ×1 IMPLANT
COVER BACK TABLE 60X90IN (DRAPES) ×1 IMPLANT
COVER SURGICAL LIGHT HANDLE (MISCELLANEOUS) ×1 IMPLANT
CUP SUT UNIV REV NEUTRAL 33 (Cup) IMPLANT
DRAPE SHEET LG 3/4 BI-LAMINATE (DRAPES) ×1 IMPLANT
DRAPE SURG 17X11 SM STRL (DRAPES) ×1 IMPLANT
DRAPE SURG ORHT 6 SPLT 77X108 (DRAPES) ×2 IMPLANT
DRAPE TOP 10253 STERILE (DRAPES) ×1 IMPLANT
DRAPE U-SHAPE 47X51 STRL (DRAPES) ×1 IMPLANT
DRESSING AQUACEL AG SP 3.5X6 (GAUZE/BANDAGES/DRESSINGS) ×1 IMPLANT
DURAPREP 26ML APPLICATOR (WOUND CARE) ×1 IMPLANT
ELECT BLADE TIP CTD 4 INCH (ELECTRODE) ×1 IMPLANT
ELECT PENCIL ROCKER SW 15FT (MISCELLANEOUS) ×1 IMPLANT
ELECT REM PT RETURN 15FT ADLT (MISCELLANEOUS) ×1 IMPLANT
FACESHIELD WRAPAROUND OR TEAM (MASK) ×5 IMPLANT
GLENOSPHERE 33+4 LAT/24 UNI RV (Joint) IMPLANT
GLOVE BIO SURGEON STRL SZ7.5 (GLOVE) ×1 IMPLANT
GLOVE BIO SURGEON STRL SZ8 (GLOVE) ×1 IMPLANT
GLOVE SS BIOGEL STRL SZ 7 (GLOVE) ×1 IMPLANT
GLOVE SS BIOGEL STRL SZ 7.5 (GLOVE) ×1 IMPLANT
GOWN STRL SURGICAL XL XLNG (GOWN DISPOSABLE) ×2 IMPLANT
KIT BASIN OR (CUSTOM PROCEDURE TRAY) ×1 IMPLANT
KIT TURNOVER KIT A (KITS) ×1 IMPLANT
LINER GLENOID HUM REV 33 +3 (Liner) IMPLANT
MANIFOLD NEPTUNE II (INSTRUMENTS) ×1 IMPLANT
NDL TAPERED W/ NITINOL LOOP (MISCELLANEOUS) ×1 IMPLANT
NEEDLE TAPERED W/ NITINOL LOOP (MISCELLANEOUS) ×1 IMPLANT
NS IRRIG 1000ML POUR BTL (IV SOLUTION) ×1 IMPLANT
PACK SHOULDER (CUSTOM PROCEDURE TRAY) ×1 IMPLANT
PAD ARMBOARD POSITIONER FOAM (MISCELLANEOUS) ×1 IMPLANT
PAD COLD SHLDR WRAP-ON (PAD) ×1 IMPLANT
PIN NITINOL TARGETER 2.8 (PIN) IMPLANT
PIN SET MODULAR GLENOID SYSTEM (PIN) IMPLANT
POST MODULAR MGS BASEPLATE 25 (Post) IMPLANT
RESTRAINT HEAD UNIVERSAL NS (MISCELLANEOUS) ×1 IMPLANT
SCREW PERI LOCK 5.5X16 (Screw) IMPLANT
SCREW PERIPHERAL 5.5X28 LOCK (Screw) IMPLANT
SCREW PERIPHERAL NL 4.5X28 (Screw) IMPLANT
SLING ARM FOAM STRAP LRG (SOFTGOODS) IMPLANT
SLING ARM FOAM STRAP MED (SOFTGOODS) IMPLANT
STEM HUM UNIV SHLD 8 (Stem) IMPLANT
STRIP CLOSURE SKIN 1/2X4 (GAUZE/BANDAGES/DRESSINGS) ×1 IMPLANT
SUT MNCRL AB 3-0 PS2 18 (SUTURE) ×1 IMPLANT
SUT MON AB 2-0 CT1 36 (SUTURE) ×1 IMPLANT
SUT VIC AB 1 CT1 36 (SUTURE) ×1 IMPLANT
SUTURE TAPE 1.3 40 TPR END (SUTURE) ×2 IMPLANT
TOWEL GREEN STERILE FF (TOWEL DISPOSABLE) ×1 IMPLANT
TOWEL OR 17X26 10 PK STRL BLUE (TOWEL DISPOSABLE) ×1 IMPLANT
TUBE SUCTION HIGH CAP CLEAR NV (SUCTIONS) ×1 IMPLANT
TUBING CONNECTING 10 (TUBING) ×1 IMPLANT
WATER STERILE IRR 1000ML POUR (IV SOLUTION) ×2 IMPLANT

## 2024-06-03 NOTE — Op Note (Signed)
 06/03/2024  11:32 AM  PATIENT:   Rebekah King  83 y.o. female  PRE-OPERATIVE DIAGNOSIS:  Left shoulder osteoarthritis  POST-OPERATIVE DIAGNOSIS: Same with the anticipated operative finding of severe glenoid retroversion  PROCEDURE: Left shoulder reverse arthroplasty lysing a press-fit size 8 Arthrex stem with a neutral metathesis, +3 polyethylene insert, 33/+4 glenosphere on a small/20 degree augment baseplate with a 25 mm central post  SURGEON:  Lavra Imler, Franky BATTLE M.D.  ASSISTANTS: Randine Ricks, PA-C  Randine Ricks, PA-C was utilized as an geophysicist/field seismologist throughout this case, essential for help with positioning the patient, positioning extremity, tissue manipulation, implantation of the prosthesis, suture management, wound closure, and intraoperative decision-making.  ANESTHESIA:   General Endotracheal and interscalene block with Exparel   EBL: 150 cc  SPECIMEN: None  Drains: None   PATIENT DISPOSITION:  PACU - hemodynamically stable.    PLAN OF CARE: Discharge to home after PACU  Brief history:  Patient is an 83 year old female well-known to our practice after previous right shoulder reverse arthroplasty that we performed several years ago that she has done very nicely with.  She presents with chronic and progressive increasing left shoulder pain related to severe osteoarthritis with finding of severe glenoid retroversion and marked bony deformity.  She is brought to the operating today for planned left shoulder reverse arthroplasty.  Preoperatively, I counseled the patient regarding treatment options and risks versus benefits thereof.  Possible surgical complications were all reviewed including potential for bleeding, infection, neurovascular injury, persistent pain, loss of motion, anesthetic complication, failure of the implant, and possible need for additional surgery. They understand and accept and agrees with our planned procedure.   Procedure detail:  After undergoing  routine preop evaluation the patient received prophylactic antibiotics and interscalene block with Exparel  was established in the holding area by the anesthesia department.  Subsequently placed spine on the operating table and underwent the smooth induction of a general endotracheal anesthesia.  Placed into the beachchair position and appropriately padded and protected.  The left shoulder girdle region was sterilely prepped and draped in standard fashion.  Timeout was called.  A deltopectoral approach to the left shoulder was made through an approximately 6 cm incision.  Skin flaps elevated dissection carried deeply and the deltopectoral interval was then developed from proximal to distal with the vein taken laterally.  The conjoined tendon was mobilized and retracted medially and the adhesions beneath the deltoid were divided.  The long head biceps tendon was then tenodesed at the upper border the pectoralis major tendon with the proximal segment unroofed and excised.  The rotator cuff superiorly was split from the apex of the bicipital groove to the base of the coracoid and the subscap was separated from the lesser tuberosity using electrocautery and the margin was tagged with a pair of grasping suture tape sutures.  Capsular attachments were then divided with the anterior and inferior margins of the humeral neck and the humeral head was then delivered through the wound.  An extra medullary guide was then used to outline the proposed humeral head resection which we performed with an oscillating saw at approximately 20 degrees of retroversion.  Marginal osteophytes were then removed with a rondure and a metal cap was placed over the cut proximal humeral surface.  We then exposed the glenoid and performed a circumferential labral resection.  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  There was noted to  be  severe retroversion as anticipated by the preop CT scan and based on the preop CT scan we identified the appropriate starting point for our glenoid guide pin which was then inserted to help correct the deformity and anticipated 20 degree augment baseplate.  The glenoid was then reamed correcting the retroversion and all bony debris was then carefully removed.  Preparation was then completed with our central drill for a 25 mm central post.  All debris was then copiously irrigated free.  The baseplate was assembled and impacted with excellent fixation.  We placed an inferior nonlocking screw for compression of the baseplate and the balance were placed with locking screw modes and all achieved excellent purchase and fixation.  A 33/+4 glenosphere was then impacted onto the baseplate and the central locking screw was placed.  We then returned attention back to the proximal humerus where the canal was opened and we broached up to a size 8 short stem at 20 degrees retroversion and then used a neutral metaphyseal reaming guide to prepare the metaphysis.  Trial implant was placed and this showed good motion stability and soft tissue balance.  Our trial was then removed.  The canal was irrigated cleaned and dried with vancomycin  powder applied in the canal.  There were some cystic areas within the humeral metaphysis and so we harvested bone from the resected humeral head and bone grafted these areas and then our stem was implanted achieving excellent fit and fixation.  Trial reduction was then performed and then again felt that a +3 poly gave us  the best motion stability and soft tissue balance.  The trial poly was removed.  The final poly was impacted and our final reduction showed excellent motion stability and soft tissue balance all much to our satisfaction.  The joint was then copiously irrigated.  Final hemostasis was obtained.  Subscapularis was mobilized and confirmed to have good elasticity and was repaired back  to the eyelets on the collar of the implant using the previously placed suture tape sutures.  The balance of the vancomycin  powder was then spread liberally throughout the deep soft tissue planes.  The deltopectoral interval was reapproximated with a series of figure-of-eight number Vicryl sutures.  2-0 Monocryl used to close the subcu layer and intracuticular 3-0 Monocryl used to close the skin followed by Steri-Strips and an Aquacel dressing.  Left arm placed into a sling and the patient was awakened, extubated, and taken to the recovery in stable condition.  Franky CHRISTELLA Pointer MD   Contact # [phone]

## 2024-06-03 NOTE — Anesthesia Postprocedure Evaluation (Signed)
 Anesthesia Post Note  Patient: Rebekah King  Procedure(s) Performed: ARTHROPLASTY, SHOULDER, TOTAL, REVERSE (Left: Shoulder)     Patient location during evaluation: PACU Anesthesia Type: Regional and General Level of consciousness: awake and alert Pain management: pain level controlled Vital Signs Assessment: post-procedure vital signs reviewed and stable Respiratory status: spontaneous breathing, nonlabored ventilation, respiratory function stable and patient connected to nasal cannula oxygen Cardiovascular status: blood pressure returned to baseline and stable Postop Assessment: no apparent nausea or vomiting Anesthetic complications: no   There were no known notable events for this encounter.  Last Vitals:  Vitals:   06/03/24 1330 06/03/24 1400  BP: (!) 173/75 (!) 179/72  Pulse: 66 68  Resp: 18 18  Temp: 37.1 C   SpO2: 98% 99%    Last Pain:  Vitals:   06/03/24 1330  TempSrc: Oral  PainSc: 0-No pain                 Yaa Donnellan

## 2024-06-03 NOTE — Care Plan (Signed)
 Ortho Bundle Case Management Note  Patient Details  Name: NEVIAH BRAUD MRN: [id number] Date of Birth: [birth year]  Lt Reverse Shoulder Arthroplasty on 06/03/24  DCP: Home with husband  DME: No needs  PT: [facility]                   DME Arranged:  N/A DME Agency:  NA  HH Arranged:    HH Agency:     Additional Comments: Please contact me with any questions of if this plan should need to change.  Burnard Dross, Case Manager [facility] [phone]  Ext. [phone]   06/03/2024, 8:27 AM

## 2024-06-03 NOTE — Anesthesia Procedure Notes (Signed)
 Anesthesia Regional Block: Interscalene brachial plexus block   Pre-Anesthetic Checklist: , timeout performed,  Correct Patient, Correct Site, Correct Laterality,  Correct Procedure, Correct Position, site marked,  Risks and benefits discussed,  Surgical consent,  Pre-op evaluation,  At surgeon's request and post-op pain management  Laterality: Left  Prep: chloraprep       Needles:  Injection technique: Single-shot  Needle Type: Echogenic Stimulator Needle     Needle Length: 5cm  Needle Gauge: 22     Additional Needles:   Procedures:, nerve stimulator,,, ultrasound used (permanent image in chart),,     Nerve Stimulator or Paresthesia:  Response: hand, 0.45 mA  Additional Responses:   Narrative:  Start time: 06/03/2024 9:05 AM End time: 06/03/2024 9:11 AM Injection made incrementally with aspirations every 5 mL.  Performed by: Personally  Anesthesiologist: Mallory Manus, MD  Additional Notes: Functioning IV was confirmed and monitors were applied.  A 50mm 22ga Arrow echogenic stimulator needle was used. Sterile prep and drape,hand hygiene and sterile gloves were used. Ultrasound guidance: relevant anatomy identified, needle position confirmed, local anesthetic spread visualized around nerve(s)., vascular puncture avoided.  Image printed for medical record. Negative aspiration and negative test dose prior to incremental administration of local anesthetic. The patient tolerated the procedure well.

## 2024-06-03 NOTE — H&P (Signed)
 Rebekah King    Chief Complaint: Left shoulder osteoarthritis HPI: The patient is a 83 y.o. female well-known to our practice after previous right shoulder anatomic arthroplasty that we performed back in 2021, presents with chronic and progressively increasing left shoulder pain related to severe arthritis with significant underlying bony deformity.  Due to her increasing functional limitations and failure to respond to prolonged attempts at conservative management, she is brought to the operating room today for planned left shoulder reverse arthroplasty.  Past Medical History:  Diagnosis Date   Acute postoperative anemia due to expected blood loss 10/22/2021   Arthritis    Claustrophobia    Complication of anesthesia    CLAUSTROPHOBIC- TROUBLE WITH ANYTHING OVER FACE   GERD (gastroesophageal reflux disease)    Glaucoma    Hypertension    Hypothyroidism    Mild cognitive impairment    Stroke (HCC)    TIA x 2  d/t hypertension   UTI (urinary tract infection)       Past Surgical History:  Procedure Laterality Date   ABDOMINAL HYSTERECTOMY     CATARACT EXTRACTION W/ INTRAOCULAR LENS  IMPLANT, BILATERAL     HERNIA REPAIR Right    femoral hernia   HIP ARTHROPLASTY Left 10/20/2021   Procedure: ARTHROPLASTY BIPOLAR HIP (HEMIARTHROPLASTY);  Surgeon: Shari Sieving, MD;  Location: [facility];  Service: Orthopedics;  Laterality: Left;   HIP ARTHROPLASTY Right    HIP FRACTURE SURGERY      RIGHT    (2ND SURGERY TO REMOVE PINS)   REFRACTIVE SURGERY     RETINAL DETACHMENT SURGERY     RIGHT   TONSILLECTOMY     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   Procedure: TOTAL KNEE ARTHROPLASTY;  Surgeon: Shari Sieving, MD;  Location: [facility];  Service: Orthopedics;  Laterality: Right;   TOTAL SHOULDER ARTHROPLASTY Right 11/25/2019   Procedure: TOTAL SHOULDER ARTHROPLASTY;  Surgeon: Melita Drivers, MD;  Location: [facility];  Service: Orthopedics;  Laterality: Right;     No family history on  file.  Social History:  reports that she has never smoked. She has never been exposed to tobacco smoke. She has never used smokeless tobacco. She reports that she does not drink alcohol  and does not use drugs.  BMI: Estimated body mass index is 23.44 kg/m as calculated from the following:   Height as of 05/25/24: 5' (1.524 m).   Weight as of 05/25/24: 54.4 kg.  Lab Results  Component Value Date   ALBUMIN 4.6 10/19/2021   Diabetes: Patient does not have a diagnosis of diabetes.     Smoking Status:   reports that she has never smoked. She has never been exposed to tobacco smoke. She has never used smokeless tobacco.     No medications prior to admission.     Physical Exam: Left shoulder demonstrates painful and guarded motion is noted at her recent office visits.  She has globally decreased strength to manual muscle testing secondary to pain and the underlying bony deformity.  She is otherwise neurovascular intact in left upper extremity.  Imaging studies confirm severe arthritis with underlying bony deformity.  CT scan imaging confirms the significant degree of bone deformity and has been utilized for preop surgical planning.  Vitals     Assessment/Plan  Impression: Left shoulder osteoarthritis  Plan of Action: Procedure(s): ARTHROPLASTY, SHOULDER, TOTAL, REVERSE  Hava Massingale M Aianna Fahs 06/03/2024, 5:49 AM Contact # [phone]

## 2024-06-03 NOTE — Discharge Instructions (Signed)

## 2024-06-03 NOTE — Evaluation (Signed)
 Occupational Therapy Evaluation Patient Details Name: Rebekah King MRN: [id number] DOB: 1941-06-21 Today's Date: 06/03/2024   History of Present Illness   Mrs. Silverstein is a 83 yr old female who is s/p a L shoulder reverse arthroplasty on 06-03-24, due to L shoulder osteoarthritis.     Clinical Impressions Pt is s/p shoulder replacement of left non-dominant upper extremity on 06-03-24. Therapist provided education and instruction to the patient and her spouse with regards to ROM/exercises, post-op precautions, upper extremity and sling positioning, donning upper extremity clothing, bathing while maintaining shoulder precautions, use of ice/cold for pain and edema management, how to use Ice Man machine, non-weight bearing status, sling wear schedule, and donning/doffing sling. Patient and spouse verbalized and demonstrated understanding as needed. Patient needed assistance to donn shirt, underwear, pants, and shoes with instruction provided on compensatory strategies to perform ADLs. Patient to follow physician's recommendations for discharge and the need for potential follow-up therapy.       If plan is discharge home, recommend the following:   Assist for transportation;Help with stairs or ramp for entrance;Assistance with cooking/housework;A little help with walking and/or transfers;A little help with bathing/dressing/bathroom     Functional Status Assessment   Patient has had a recent decline in their functional status and demonstrates the ability to make significant improvements in function in a reasonable and predictable amount of time.     Equipment Recommendations   Tub/shower seat     Recommendations for Other Services         Precautions/Restrictions   Precautions Precautions: Shoulder Type of Shoulder Precautions: If sitting in controlled environment, ok to come out of sling to give neck a break. Please sleep in it to protect until follow up in office. OK to use  operative arm for feeding, hygiene and ADLs. Ok to instruct Pendulums and lap slides as exercises. Ok to use operative arm within the following parameters for ADL purposes. New ROM (8/18)   Ok for PROM, AAROM, AROM within pain tolerance and within the following ROM   ER 20   ABD 45   FE 60. Sling on at all times except ADLs/exercise. No exercises for internal rotation. Okay to perform elbow, wrist, and hand ROM. Shoulder Interventions: Shoulder sling/immobilizer Precaution Booklet Issued: Yes (comment) Recall of Precautions/Restrictions: Intact Required Braces or Orthoses: Sling Restrictions Weight Bearing Restrictions Per Provider Order: Yes LUE Weight Bearing Per Provider Order: Non weight bearing     Mobility Bed Mobility               General bed mobility comments: pt was received seated in the chair    Transfers Overall transfer level: Needs assistance Equipment used: None Transfers: Sit to/from Stand Sit to Stand: Contact guard assist                  Balance Overall balance assessment: Mild deficits observed, not formally tested            ADL either performed or assessed with clinical judgement   ADL Overall ADL's : Needs assistance/impaired                 Upper Body Dressing : Moderate assistance;Cueing for compensatory techniques;Cueing for sequencing;Sitting Upper Body Dressing Details (indicate cue type and reason): Pt required assist with donning an around the back shirt in sitting at chair level. She was instructed on putting LUE through sleeve first when donning shirt. Lower Body Dressing: Moderate assistance;Sitting/lateral leans;Sit to/from stand;Cueing for compensatory techniques;Cueing for sequencing Lower  Body Dressing Details (indicate cue type and reason): Patient required assist to donn her underwear, pants and shoes.               General ADL Comments: Per orders by Dr. Melita, shoulder parameters as follows for ADL tasks; ER  20, ABD 45  And FE 60 degrees. Patient & her spouse/caregiver also educated on okay to perform elbow/wrist/hand ROM, while moving within specified parameters, recommendations for bathing, and how to donn/doff shirt by placing operative arm through sleeve first when donning and off last when doffing. Pt/caregiver also educated on compensatory strategies for lower body ADLs, strategies to reduce risk of falls,  donning/doffing sling, sling wear schedule reflective of the sling being on at all times with the exception of ADLs, exercises and when in a controlled environment, and how to loosen the neck strap of the sling when the operative arm is in a supported position when sitting. In sitting or supine, pt instructed to have a pillow behind and under their operative arm to provide support.  Education regarding use of IceMan Cold Therapy completed, including the importance of using a barrier on the shoulder prior to positioning the wrap-on pad. Pt/caregiver verbalized/demonstrated understanding.     Vision Baseline Vision/History: 1 Wears glasses Additional Comments: She reported a history of baseline vision impairment.            Pertinent Vitals/Pain Pain Assessment Pain Assessment: No/denies pain     Extremity/Trunk Assessment Upper Extremity Assessment Upper Extremity Assessment: Right hand dominant   Lower Extremity Assessment Lower Extremity Assessment: Overall WFL for tasks assessed       Communication Communication Communication: No apparent difficulties   Cognition Arousal: Alert Behavior During Therapy: WFL for tasks assessed/performed Cognition: No apparent impairments             OT - Cognition Comments: Oriented x4        Following commands: Intact       Cueing  General Comments   Cueing Techniques: Verbal cues         Shoulder Instructions Shoulder Instructions Donning/doffing shirt without moving shoulder: Moderate assistance Method for sponge  bathing under operated UE:  (Patient and caregiver verbalized understanding) Donning/doffing sling/immobilizer: Moderate assistance Correct positioning of sling/immobilizer: Moderate assistance Pendulum exercises (written home exercise program):  (Patient and caregiver verbalized understanding) ROM for elbow, wrist and digits of operated UE:  (Patient and caregiver verbalized understanding) Sling wearing schedule (on at all times/off for ADL's):  (Patient and caregiver verbalized understanding) Proper positioning of operated UE when showering:  (Patient and caregiver verbalized understanding) Dressing change:  (Patient and caregiver verbalized understanding) Positioning of UE while sleeping:  (Patient and caregiver verbalized understanding)    Home Living Family/patient expects to be discharged to:: Private residence Living Arrangements: Spouse/significant other Available Help at Discharge: Family Type of Home: House Home Access: Stairs to enter Secretary/administrator of Steps: 1   Home Layout: Two level;Laundry or work area in Artist of Steps: main level and basement level to home; she does not have to access the basement   Bathroom Shower/Tub: Tub/shower unit   [employer]: Handicapped height     Home Equipment: Agricultural Consultant (2 wheels);Cane - single point;BSC/3in1          Prior Functioning/Environment Prior Level of Function : Independent/Modified Independent             Mobility Comments:  (She was independent with ambulation.) ADLs Comments:  (She was modified independent  to independent with ADLs, she does not drive due to impaired vision, she cooks and she shares the cleaning with her spouse.)    OT Problem List: Decreased range of motion;Decreased strength;Impaired UE functional use        OT Goals(Current goals can be found in the care plan section)   Acute Rehab OT Goals OT Goal Formulation: All assessment and education  complete, DC therapy         AM-PAC OT 6 Clicks Daily Activity     Outcome Measure Help from another person eating meals?: None Help from another person taking care of personal grooming?: A Little Help from another person toileting, which includes using toliet, bedpan, or urinal?: A Little Help from another person bathing (including washing, rinsing, drying)?: A Lot Help from another person to put on and taking off regular upper body clothing?: A Lot Help from another person to put on and taking off regular lower body clothing?: A Lot 6 Click Score: 16   End of Session Equipment Utilized During Treatment: Other (comment) (N/A) Nurse Communication: Other (comment) (shoulder education completed)  Activity Tolerance: Patient tolerated treatment well Patient left: in chair;with call bell/phone within reach;with family/visitor present  OT Visit Diagnosis: Muscle weakness (generalized) (M62.81)                Time: 8587-8542 OT Time Calculation (min): 45 min Charges:  OT General Charges $OT Visit: 1 Visit OT Evaluation $OT Eval Moderate Complexity: 1 Mod OT Treatments $Self Care/Home Management : 8-22 mins    Delanna JINNY Lesches, OTR/L 06/03/2024, 5:29 PM

## 2024-06-03 NOTE — Transfer of Care (Signed)
 Immediate Anesthesia Transfer of Care Note  Patient: Rebekah King  Procedure(s) Performed: ARTHROPLASTY, SHOULDER, TOTAL, REVERSE (Left: Shoulder)  Patient Location: PACU  Anesthesia Type:General  Level of Consciousness: awake, alert , oriented, and patient cooperative  Airway & Oxygen Therapy: Patient Spontanous Breathing and Patient connected to nasal cannula oxygen  Post-op Assessment: Report given to RN and Post -op Vital signs reviewed and stable  Post vital signs: Reviewed and stable  Last Vitals:  Vitals Value Taken Time  BP 196/94 06/03/24 12:02  Temp 36.3 C 06/03/24 12:01  Pulse 78 06/03/24 12:07  Resp 19 06/03/24 12:07  SpO2 100 % 06/03/24 12:07  Vitals shown include unfiled device data.  Last Pain:  Vitals:   06/03/24 1201  TempSrc:   PainSc: 0-No pain         Complications: There were no known notable events for this encounter.

## 2024-06-03 NOTE — Anesthesia Procedure Notes (Signed)
 Procedure Name: Intubation Date/Time: 06/03/2024 10:03 AM  Performed by: Erick Fitz, CRNAPre-anesthesia Checklist: Patient identified, Emergency Drugs available, Suction available, Patient being monitored and Timeout performed Patient Re-evaluated:Patient Re-evaluated prior to induction Oxygen Delivery Method: Circle system utilized Preoxygenation: Pre-oxygenation with 100% oxygen Induction Type: IV induction Ventilation: Mask ventilation without difficulty Laryngoscope Size: Mac and 3 Grade View: Grade I Tube type: Oral Tube size: 7.0 mm Number of attempts: 1 Airway Equipment and Method: Stylet Placement Confirmation: ETT inserted through vocal cords under direct vision, positive ETCO2, CO2 detector and breath sounds checked- equal and bilateral Secured at: 21 cm Tube secured with: Tape (secured with white silk 1/2 tape) Dental Injury: Teeth and Oropharynx as per pre-operative assessment

## 2024-06-04 ENCOUNTER — Encounter (HOSPITAL_COMMUNITY): Payer: Self-pay | Admitting: Orthopedic Surgery

## 2024-07-16 ENCOUNTER — Ambulatory Visit: Payer: Medicare Other | Admitting: Urology

## 2024-08-09 ENCOUNTER — Encounter: Payer: Self-pay | Admitting: Urology

## 2024-08-09 ENCOUNTER — Ambulatory Visit: Admitting: Urology

## 2024-08-09 VITALS — BP 162/68 | HR 72

## 2024-08-09 DIAGNOSIS — Z87440 Personal history of urinary (tract) infections: Secondary | ICD-10-CM | POA: Diagnosis not present

## 2024-08-09 DIAGNOSIS — N3021 Other chronic cystitis with hematuria: Secondary | ICD-10-CM

## 2024-08-09 LAB — URINALYSIS, ROUTINE W REFLEX MICROSCOPIC
Bilirubin, UA: NEGATIVE
Glucose, UA: NEGATIVE
Nitrite, UA: NEGATIVE
RBC, UA: NEGATIVE
Specific Gravity, UA: 1.02 (ref 1.005–1.030)
Urobilinogen, Ur: 1 mg/dL (ref 0.2–1.0)
pH, UA: 5 (ref 5.0–7.5)

## 2024-08-09 LAB — MICROSCOPIC EXAMINATION

## 2024-08-09 MED ORDER — CEPHALEXIN 500 MG PO CAPS: 500.0000 mg | ORAL_CAPSULE | Freq: Two times a day (BID) | ORAL | 11 refills | Status: AC

## 2024-08-09 MED ORDER — NITROFURANTOIN MACROCRYSTAL 50 MG PO CAPS: 50.0000 mg | ORAL_CAPSULE | Freq: Every day | ORAL | 3 refills | Status: AC

## 2024-08-09 NOTE — Patient Instructions (Signed)

## 2024-08-09 NOTE — Progress Notes (Signed)
 "  08/09/2024 3:13 PM   Rebekah King 12-09-1940 [id number]  Referring provider: Atilano King ORN, MD [address] Rebekah King,  [place] [address]  Frequent UTI   HPI: Ms Rebekah King is a 83yo here for followup for frequent UTI. She has had 1 UTI since last visit. She is on macrodantin  prophylaxis. She tried to decrease macrodantin  to every other day and then she got a UTI within 2 weeks. She has had her UTI treated with keflex . UA today shows few bacteria. She denies any hematuria or dysuria   PMH: Past Medical History:  Diagnosis Date   Acute postoperative anemia due to expected blood loss 10/22/2021   Arthritis    Claustrophobia    Complication of anesthesia    CLAUSTROPHOBIC- TROUBLE WITH ANYTHING OVER FACE   GERD (gastroesophageal reflux disease)    Glaucoma    Hypertension    Hypothyroidism    Mild cognitive impairment    Stroke (HCC)    TIA x 2  d/t hypertension   UTI (urinary tract infection)     Surgical History: Past Surgical History:  Procedure Laterality Date   ABDOMINAL HYSTERECTOMY     CATARACT EXTRACTION W/ INTRAOCULAR LENS  IMPLANT, BILATERAL     HERNIA REPAIR Right    femoral hernia   HIP ARTHROPLASTY Left 10/20/2021   Procedure: ARTHROPLASTY BIPOLAR HIP (HEMIARTHROPLASTY);  Surgeon: Shari Sieving, MD;  Location: [facility];  Service: Orthopedics;  Laterality: Left;   HIP ARTHROPLASTY Right    HIP FRACTURE SURGERY      RIGHT    (2ND SURGERY TO REMOVE PINS)   REFRACTIVE SURGERY     RETINAL DETACHMENT SURGERY     RIGHT   REVERSE SHOULDER ARTHROPLASTY Left 06/03/2024   Procedure: ARTHROPLASTY, SHOULDER, TOTAL, REVERSE;  Surgeon: Melita Drivers, MD;  Location: [facility];  Service: Orthopedics;  Laterality: Left;    TONSILLECTOMY     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   Procedure: TOTAL KNEE ARTHROPLASTY;  Surgeon: Shari Sieving, MD;  [facility];  Service: Orthopedics;  Laterality: Right;   TOTAL SHOULDER ARTHROPLASTY Right 11/25/2019    Procedure: TOTAL SHOULDER ARTHROPLASTY;  Surgeon: Melita Drivers, MD;  Location: [facility];  Service: Orthopedics;  Laterality: Right;     Home Medications:  Allergies as of 08/09/2024       Reactions   Brimonidine Other (See Comments)   Severe dry mouth   Timolol  Other (See Comments)   Lightheaded   Bovine (beef) Protein-containing Drug Products Nausea And Vomiting   After being bitten by tick   Misc. Sulfonamide Containing Compounds    Amoxicillin-pot Clavulanate Diarrhea   GI pain   Sulfa Antibiotics Nausea Only, Rash   Hands go numb        Medication List        Accurate as of August 09, 2024  3:13 PM. If you have any questions, ask your nurse or doctor.          acetaminophen  500 MG tablet Commonly known as: TYLENOL  Take 500-1,000 mg by mouth every 6 (six) hours as needed (pain.).   ascorbic acid  500 MG tablet Commonly known as: VITAMIN C Take 500 mg by mouth in the morning.   CALCIUM  600+D3 PO Take 1 tablet by mouth at bedtime.   clopidogrel  75 MG tablet Commonly known as: PLAVIX  TAKE ONE TABLET BY MOUTH EVERY DAY   cyclobenzaprine  10 MG tablet Commonly known as: FLEXERIL  Take 1 tablet (10 mg total) by  mouth 3 (three) times daily as needed for muscle spasms.   dorzolamide  2 % ophthalmic solution Commonly known as: TRUSOPT  Place 1 drop into both eyes 2 (two) times daily.   HEALTHY EYES PO Take 1 capsule by mouth in the morning and at bedtime. Eye Healthy Complex   IBGARD PO Take 1 capsule by mouth in the morning and at bedtime.   latanoprost  0.005 % ophthalmic solution Commonly known as: XALATAN  Place 1 drop into both eyes at bedtime.   levothyroxine  50 MCG tablet Commonly known as: SYNTHROID  Take 50 mcg by mouth daily before breakfast.   loperamide 2 MG tablet Commonly known as: IMODIUM A-D Take 1 mg by mouth in the morning.   meloxicam  15 MG tablet Commonly known as: MOBIC  Take 1 tablet (15 mg total) by mouth in the morning.    multivitamin with minerals Tabs tablet Take 1 tablet by mouth at bedtime. Women's Multivitamin 50+   nitrofurantoin  50 MG capsule Commonly known as: MACRODANTIN  Take 1 capsule (50 mg total) by mouth at bedtime. What changed:  when to take this additional instructions   olmesartan-hydrochlorothiazide 40-12.5 MG tablet Commonly known as: BENICAR HCT Take 1 tablet by mouth in the morning.   ondansetron  4 MG tablet Commonly known as: Zofran  Take 1 tablet (4 mg total) by mouth every 8 (eight) hours as needed for nausea or vomiting.   oxyCODONE -acetaminophen  5-325 MG tablet Commonly known as: Percocet Take 1 tablet by mouth every 4 (four) hours as needed (max 6 q).   pantoprazole  40 MG tablet Commonly known as: PROTONIX  Take 40 mg by mouth 2 (two) times daily.   PROBIOTIC DAILY PO Take 1 capsule by mouth in the morning.   simvastatin  20 MG tablet Commonly known as: ZOCOR  Take 20 mg by mouth every evening.   SYSTANE OP Place 1 drop into both eyes 4 (four) times daily as needed (dry/irritated eyes.).   Vitamin D3 125 MCG (5000 UT) Tabs Take 5,000 Units by mouth at bedtime.        Allergies: Allergies[1]  Family History: No family history on file.  Social History:  reports that she has never smoked. She has never been exposed to tobacco smoke. She has never used smokeless tobacco. She reports that she does not drink alcohol  and does not use drugs.  ROS: All other review of systems were reviewed and are negative except what is noted above in HPI  Physical Exam: BP (!) 162/68   Pulse 72   Constitutional:  Alert and oriented, No acute distress. HEENT: [name], moist mucus membranes.  Trachea midline, no masses. Cardiovascular: No clubbing, cyanosis, or edema. Respiratory: Normal respiratory effort, no increased work of breathing. GI: Abdomen is soft, nontender, nondistended, no abdominal masses GU: No CVA tenderness.  Lymph: No cervical or inguinal  lymphadenopathy. Skin: No rashes, bruises or suspicious lesions. Neurologic: Grossly intact, no focal deficits, moving all 4 extremities. Psychiatric: Normal mood and affect.  Laboratory Data: Lab Results  Component Value Date   WBC 6.9 05/25/2024   HGB 10.1 (L) 05/25/2024   HCT 31.7 (L) 05/25/2024   MCV 97.5 05/25/2024   PLT 264 05/25/2024    Lab Results  Component Value Date   CREATININE 0.88 05/25/2024    No results found for: PSA  No results found for: TESTOSTERONE  Lab Results  Component Value Date   HGBA1C 5.4 09/03/2021    Urinalysis    Component Value Date/Time   COLORURINE AMBER (A) 03/25/2021 2139  APPEARANCEUR Clear 07/11/2023 1133   LABSPEC 1.009 03/25/2021 2139   PHURINE 5.0 03/25/2021 2139   GLUCOSEU Negative 07/11/2023 1133   HGBUR SMALL (A) 03/25/2021 2139   BILIRUBINUR Negative 07/11/2023 1133   KETONESUR NEGATIVE 03/25/2021 2139   PROTEINUR Negative 07/11/2023 1133   PROTEINUR NEGATIVE 03/25/2021 2139   UROBILINOGEN negative (A) 09/01/2019 1100   NITRITE Negative 07/11/2023 1133   NITRITE POSITIVE (A) 03/25/2021 2139   LEUKOCYTESUR Negative 07/11/2023 1133   LEUKOCYTESUR NEGATIVE 03/25/2021 2139    Lab Results  Component Value Date   LABMICR Comment 07/11/2023   WBCUA None seen 07/12/2022   LABEPIT 0-10 07/12/2022   MUCUS Present 08/23/2020   BACTERIA None seen 07/12/2022    Pertinent Imaging:  No results found for this or any previous visit.  No results found for this or any previous visit.  No results found for this or any previous visit.  No results found for this or any previous visit.  No results found for this or any previous visit.  No results found for this or any previous visit.  No results found for this or any previous visit.  No results found for this or any previous visit.   Assessment & Plan:    1. Chronic cystitis with hematuria (Primary) Continue macrodantin  50mg  at bedtime Self start keflex  -  Urinalysis, Routine w reflex microscopic   No follow-ups on file.  Belvie Clara, MD  [facility] [place]       [1]  Allergies Allergen Reactions   Brimonidine Other (See Comments)    Severe dry mouth   Timolol  Other (See Comments)    Lightheaded   Bovine (Beef) Protein-Containing Drug Products Nausea And Vomiting    After being bitten by tick   Misc. Sulfonamide Containing Compounds    Amoxicillin-Pot Clavulanate Diarrhea    GI pain     Sulfa Antibiotics Nausea Only and Rash    Hands go numb   "

## 2024-08-11 ENCOUNTER — Ambulatory Visit: Payer: Self-pay

## 2024-08-11 LAB — URINE CULTURE

## 2024-08-26 ENCOUNTER — Other Ambulatory Visit: Payer: Self-pay | Admitting: *Deleted

## 2024-08-26 DIAGNOSIS — K4090 Unilateral inguinal hernia, without obstruction or gangrene, not specified as recurrent: Secondary | ICD-10-CM

## 2024-09-07 ENCOUNTER — Ambulatory Visit: Admitting: General Surgery
